# Patient Record
Sex: Female | Born: 1940 | Race: Black or African American | Hispanic: No | Smoking: Never smoker
Health system: Southern US, Community
[De-identification: ages and names within clinical notes are randomized; demographics above are authoritative.]

## PROBLEM LIST (undated history)

## (undated) DIAGNOSIS — E785 Hyperlipidemia, unspecified: Secondary | ICD-10-CM

## (undated) DIAGNOSIS — R011 Cardiac murmur, unspecified: Secondary | ICD-10-CM

## (undated) DIAGNOSIS — M810 Age-related osteoporosis without current pathological fracture: Secondary | ICD-10-CM

## (undated) DIAGNOSIS — I10 Essential (primary) hypertension: Secondary | ICD-10-CM

## (undated) DIAGNOSIS — J302 Other seasonal allergic rhinitis: Secondary | ICD-10-CM

## (undated) HISTORY — PX: TUBAL LIGATION: SHX77

## (undated) HISTORY — DX: Other seasonal allergic rhinitis: J30.2

## (undated) HISTORY — PX: WRIST SURGERY: SHX841

## (undated) HISTORY — PX: CATARACT EXTRACTION: SUR2

## (undated) HISTORY — PX: FRACTURE SURGERY: SHX138

## (undated) HISTORY — DX: Age-related osteoporosis without current pathological fracture: M81.0

## (undated) HISTORY — DX: Hyperlipidemia, unspecified: E78.5

## (undated) HISTORY — PX: ORIF ANKLE FRACTURE: SUR919

---

## 1998-03-22 ENCOUNTER — Other Ambulatory Visit: Admission: RE | Admit: 1998-03-22 | Discharge: 1998-03-22 | Payer: Self-pay | Admitting: Obstetrics and Gynecology

## 1998-03-22 ENCOUNTER — Other Ambulatory Visit: Admission: RE | Admit: 1998-03-22 | Discharge: 1998-03-22 | Payer: Self-pay | Admitting: Family Medicine

## 2001-02-07 ENCOUNTER — Encounter: Payer: Self-pay | Admitting: Family Medicine

## 2001-02-07 ENCOUNTER — Encounter: Admission: RE | Admit: 2001-02-07 | Discharge: 2001-02-07 | Payer: Self-pay | Admitting: Family Medicine

## 2001-05-09 ENCOUNTER — Emergency Department (HOSPITAL_COMMUNITY): Admission: EM | Admit: 2001-05-09 | Discharge: 2001-05-09 | Payer: Self-pay | Admitting: Emergency Medicine

## 2001-05-10 ENCOUNTER — Encounter: Payer: Self-pay | Admitting: Emergency Medicine

## 2002-02-18 ENCOUNTER — Encounter: Admission: RE | Admit: 2002-02-18 | Discharge: 2002-02-18 | Payer: Self-pay | Admitting: Family Medicine

## 2002-03-18 ENCOUNTER — Encounter: Admission: RE | Admit: 2002-03-18 | Discharge: 2002-03-18 | Payer: Self-pay | Admitting: Family Medicine

## 2002-03-26 ENCOUNTER — Encounter: Admission: RE | Admit: 2002-03-26 | Discharge: 2002-03-26 | Payer: Self-pay | Admitting: Family Medicine

## 2002-08-06 ENCOUNTER — Encounter: Admission: RE | Admit: 2002-08-06 | Discharge: 2002-08-06 | Payer: Self-pay | Admitting: Family Medicine

## 2002-09-01 ENCOUNTER — Encounter: Admission: RE | Admit: 2002-09-01 | Discharge: 2002-09-01 | Payer: Self-pay | Admitting: Family Medicine

## 2002-09-08 ENCOUNTER — Encounter: Admission: RE | Admit: 2002-09-08 | Discharge: 2002-09-08 | Payer: Self-pay | Admitting: Sports Medicine

## 2003-05-13 ENCOUNTER — Encounter: Admission: RE | Admit: 2003-05-13 | Discharge: 2003-05-13 | Payer: Self-pay | Admitting: Family Medicine

## 2003-09-29 ENCOUNTER — Encounter: Admission: RE | Admit: 2003-09-29 | Discharge: 2003-09-29 | Payer: Self-pay | Admitting: Family Medicine

## 2003-12-13 ENCOUNTER — Encounter: Admission: RE | Admit: 2003-12-13 | Discharge: 2003-12-13 | Payer: Self-pay | Admitting: Family Medicine

## 2004-04-28 ENCOUNTER — Encounter: Admission: RE | Admit: 2004-04-28 | Discharge: 2004-04-28 | Payer: Self-pay | Admitting: Sports Medicine

## 2004-12-07 ENCOUNTER — Ambulatory Visit (HOSPITAL_COMMUNITY): Admission: RE | Admit: 2004-12-07 | Discharge: 2004-12-07 | Payer: Self-pay | Admitting: Family Medicine

## 2004-12-07 ENCOUNTER — Ambulatory Visit: Payer: Self-pay | Admitting: Family Medicine

## 2005-01-16 ENCOUNTER — Ambulatory Visit: Payer: Self-pay | Admitting: Sports Medicine

## 2005-01-24 ENCOUNTER — Encounter (INDEPENDENT_AMBULATORY_CARE_PROVIDER_SITE_OTHER): Payer: Self-pay | Admitting: *Deleted

## 2005-01-25 ENCOUNTER — Ambulatory Visit: Payer: Self-pay | Admitting: Family Medicine

## 2005-02-15 ENCOUNTER — Ambulatory Visit: Payer: Self-pay | Admitting: Family Medicine

## 2005-05-02 ENCOUNTER — Emergency Department (HOSPITAL_COMMUNITY): Admission: EM | Admit: 2005-05-02 | Discharge: 2005-05-02 | Payer: Self-pay | Admitting: Emergency Medicine

## 2005-08-09 ENCOUNTER — Ambulatory Visit: Payer: Self-pay | Admitting: Family Medicine

## 2005-09-14 ENCOUNTER — Ambulatory Visit: Payer: Self-pay | Admitting: Family Medicine

## 2006-02-19 ENCOUNTER — Ambulatory Visit: Payer: Self-pay | Admitting: Sports Medicine

## 2006-08-08 ENCOUNTER — Ambulatory Visit: Payer: Self-pay | Admitting: Family Medicine

## 2006-08-13 ENCOUNTER — Encounter: Admission: RE | Admit: 2006-08-13 | Discharge: 2006-08-13 | Payer: Self-pay | Admitting: Sports Medicine

## 2006-10-11 ENCOUNTER — Ambulatory Visit: Payer: Self-pay | Admitting: Family Medicine

## 2007-01-23 DIAGNOSIS — I10 Essential (primary) hypertension: Secondary | ICD-10-CM | POA: Insufficient documentation

## 2007-01-23 DIAGNOSIS — I498 Other specified cardiac arrhythmias: Secondary | ICD-10-CM | POA: Insufficient documentation

## 2007-01-23 DIAGNOSIS — E7800 Pure hypercholesterolemia, unspecified: Secondary | ICD-10-CM | POA: Insufficient documentation

## 2007-01-23 DIAGNOSIS — N3941 Urge incontinence: Secondary | ICD-10-CM

## 2007-01-23 DIAGNOSIS — M810 Age-related osteoporosis without current pathological fracture: Secondary | ICD-10-CM | POA: Insufficient documentation

## 2007-01-24 ENCOUNTER — Encounter (INDEPENDENT_AMBULATORY_CARE_PROVIDER_SITE_OTHER): Payer: Self-pay | Admitting: *Deleted

## 2007-04-01 ENCOUNTER — Other Ambulatory Visit: Admission: RE | Admit: 2007-04-01 | Discharge: 2007-04-01 | Payer: Self-pay | Admitting: Family Medicine

## 2007-12-30 ENCOUNTER — Emergency Department (HOSPITAL_COMMUNITY): Admission: EM | Admit: 2007-12-30 | Discharge: 2007-12-30 | Payer: Self-pay | Admitting: Emergency Medicine

## 2008-01-12 ENCOUNTER — Encounter (INDEPENDENT_AMBULATORY_CARE_PROVIDER_SITE_OTHER): Payer: Self-pay | Admitting: Internal Medicine

## 2008-01-12 ENCOUNTER — Ambulatory Visit: Payer: Self-pay | Admitting: *Deleted

## 2008-01-12 ENCOUNTER — Inpatient Hospital Stay (HOSPITAL_COMMUNITY): Admission: EM | Admit: 2008-01-12 | Discharge: 2008-01-13 | Payer: Self-pay | Admitting: Emergency Medicine

## 2009-02-09 ENCOUNTER — Emergency Department (HOSPITAL_COMMUNITY): Admission: EM | Admit: 2009-02-09 | Discharge: 2009-02-09 | Payer: Self-pay | Admitting: Emergency Medicine

## 2009-09-09 ENCOUNTER — Other Ambulatory Visit: Admission: RE | Admit: 2009-09-09 | Discharge: 2009-09-09 | Payer: Self-pay | Admitting: Family Medicine

## 2011-02-12 ENCOUNTER — Inpatient Hospital Stay (INDEPENDENT_AMBULATORY_CARE_PROVIDER_SITE_OTHER)
Admission: RE | Admit: 2011-02-12 | Discharge: 2011-02-12 | Disposition: A | Discharge status: Home / Self Care | Payer: Medicare Other | Source: Ambulatory Visit | Attending: Family Medicine | Admitting: Family Medicine

## 2011-02-12 DIAGNOSIS — J40 Bronchitis, not specified as acute or chronic: Secondary | ICD-10-CM

## 2011-04-10 NOTE — H&P (Signed)
[name], [name]                ACCOUNT NO.:  [id number]   MEDICAL RECORD NO.:  [id number]          PATIENT TYPE:  OBV   LOCATION:  1424                         FAC[facility]   PHYSICIAN:  Michiel Cowboy, MDDATE OF BIRTH:  1941/05/19   DATE OF ADMISSION:  01/11/2008  DATE OF DISCHARGE:                              HISTORY & PHYSICAL   CHIEF COMPLAINT:  Syncope.   PRIMARY CARE PHYSICIAN:  Dr. Holley Bouche.   HISTORY OF PRESENT ILLNESS:  This is a 70 year old female with a past  medical history of hyperlipidemia and hypertension.  She presents with  an episode of syncope.  The patient was at her baseline state of health  up until this morning, when she woke up with a pounding headache which  is not the usual thing for her.  She went to the bathroom to urinate,  and then went back to bed.  She continued to have a headache.  She felt  that maybe she needs to make sure she took her blood pressure  medication.  She went to the kitchen and reached out for her blood  pressure medication and fell on the floor.  She recollects herself  waking up shortly thereafter.  She does not quite remember how she  collapsed.  No witnesses to the event.  She was back to her baseline as  soon as she woke up.  No transient confusion.  No tongue biting or  incontinence.  She continued to have severe headache.  EMS was called,  and the patient was brought to the emergency department.  The [facility] called to admit the patient.   REVIEW OF SYSTEMS:  Otherwise the review of systems is negative.  No  chest pain, no shortness of breath.  The patient is a fairly active  female and does not report any exertional chest pain or shortness of  breath.  No prior episodes of syncope.  The patient does not get  frequent headaches.   PAST MEDICAL HISTORY:  1. Osteoporosis.  2. Hypertension.  3. Hyperlipidemia.  4. Urinary retention.   ALLERGIES:  No known drug allergies.   CURRENT  MEDICATIONS:  1. Hydrochlorothiazide 25 mg daily.  2. Norvasc 5 mg daily.  3. Fexofenadine 180 mg daily.  4. Zocor 80 mg once daily.  5. Oxybutynin 0.5 mg twice daily.  6. Aspirin 81 mg once daily.   SOCIAL HISTORY:  The patient lives at home alone, but has a son and a  daughter who live near her in that town.  Does not smoke.  Does not  drink.  Does not use IV drugs.   FAMILY HISTORY:  Significant for a brother with colon cancer.  Another  brother with lung cancer.  Both parents are dead with history of  high  blood pressure.   PHYSICAL EXAMINATION:  VITAL SIGNS:  Blood pressure 161/78, heart rate  78, respirations 16, temperature 98.6 degrees, saturation 97% on room  air.  GENERAL:  Overall she appears to be in no acute distress.  HEENT:  Head atraumatic.  Pupils equal, round, reactive to  light and  accommodation.  NECK:  No lymphadenopathy noted.  No bruits noted.  LUNGS:  Clear to auscultation bilaterally.  HEART:  A regular rate and rhythm.  No murmurs noted.  ABDOMEN:  No bruits noted.  EXTREMITIES:  Lower extremities had edema.  Pulses equal bilaterally.  NEUROLOGIC:  Intact.   LABORATORY DATA:  White blood cell count slightly up at 11.2, hemoglobin  14.6.  Sodium 139, potassium 3.6, bicarbonate slightly elevated at 32,  creatinine 0.7.  D-dimer negative.  Urinalysis shows a few bacteria but  no white blood cells.   Chest x-ray was within normal limits.  A CT of the head without  contrast:  No acute hemorrhage.   Electrocardiogram:  Showing normal sinus rhythm, question of possible  left atrial enlargement.  There is very minimal, less than 1 mm, ST  depression in lead V4 and V5, which on repeat electrocardiogram does not  show.  Question of small Q-waves in leads I and aVF.  Large R-wave in  lead V1.   ASSESSMENT/PLAN:  This is a 70 year old female with a history of  hypertension and hyperlipidemia, with syncope.  1. Cause of syncope differential could include  cardiogenic, versus      vasovagal, versus orthostasis:  Will check orthostatics and admit      the patient to telemetry.  Given a history of hypertension and age      above 80, will rule out for a myocardial infarction with serial      cardiac enzymes and serial electrocardiograms.  Also will obtain an      echocardiogram of the heart to rule out for valvular abnormalities      or evidence of prior myocardial infarction.  Will obtain fasting      lipid panel.  The patient may benefit from an outpatient stress      test in the future, given some abnormalities on her      electrocardiogram.  2. Hypertension:  Will continue Norvasc but hold off on      hydrochlorothiazide.  3. Hyperlipidemia:  Continue Zocor.  4. Coronary artery disease with prophylaxis.  Will continue aspirin.      We will check a hemoglobin A1c, TSH and beta natriuretic peptide.  5. Possibly dehydrated:  Will hold hydrochlorothiazide and give normal      saline 120 mL an hour.  6. Headache:  A CT of the head within normal limits.  The etiology      could be migraine headache, versus given that it is happening upon      awakening, worry for the possibility of sleep apnea.  The patient      may benefit from a      sleep study in the future if this continues to persist.  Given that      the patient states that she sometimes feels a pulse in her head,      will obtain Dopplers of her neck.  7. Prophylaxis:  Lovenox and a good diet.      Michiel Cowboy, MD  Electronically Signed     AVD/MEDQ  D:  01/11/2008  T:  01/12/2008  Job:  [id number]   cc:   Holley Bouche, M.D.  Fax: [phone]

## 2011-04-10 NOTE — Discharge Summary (Signed)
[name], [name]                ACCOUNT NO.:  [id number]   MEDICAL RECORD NO.:  [id number]          PATIENT TYPE:  INP   LOCATION:  1424                         FACILITY:  [facility]   PHYSICIAN:  Michiel Cowboy, MDDATE OF BIRTH:  17-Feb-1941   DATE OF ADMISSION:  01/11/2008  DATE OF DISCHARGE:  01/13/2008                               DISCHARGE SUMMARY   DISCHARGE DIAGNOSES:  1. Syncope.  2. Palpations.  3. Sinus bradycardia.  4. Premature ventricular contractions.  5. Osteoporosis.  6. Hypertension.  7. Hyperlipidemia.  8. History of urinary retention.  9. Questionable patent foramen ovale by an echocardiogram in the past,      not seen on this echocardiogram during this admission.   STUDIES:  An echocardiogram done on January 12, 2008, showed an  ejection fraction of 70%-75%.  No left ventricular regional wall motion  abnormalities.  Left ventricular wall thickness slightly increased.  Aortic wall thickness mildly increased.  Aortic valve calcified.  There  was a moderate tricuspid valvular regurgitation.   Carotid Dopplers performed showing no significant right or left ICA  stenosis.  There is mild heterogeneous plaque noted at the bifurcations,  but otherwise normal.   A chest x-ray done on January 11, 2008, showed no evidence of  cardiopulmonary disease, evidence of old granulomatous disease.  A CT of  the head without contrast negative for bleed.   Electrocardiogram:  Showed no acute ischemia or infarction.   HISTORY OF PRESENT ILLNESS:  Please see the H&P.  This is a pleasant 70-  year-old female with a past medical history of hyperlipidemia and  hypertension, who presents with an episode of syncope.  She was at her  baseline state of health until the morning of admission, when she had a  pounding headache.  The patient went to the bathroom to urinate and then  back to bed.  She tried to get up to take her medications and collapsed.  She woke up shortly  thereafter on the floor.  Reports no change in  mental status.  The patient was admitted by the [facility] for  further evaluation.   HOSPITAL COURSE:  #1 - SYNCOPE:  The etiology is unclear, possibly  vasovagal, versus post-micturition.  The patient was noted to be not  orthostatic, although initially she was felt to be just slightly dry, so  her hydrochlorothiazide was held and she received a little bit of  fluids.  An echocardiogram was performed which was within normal limits.  Cardiac enzymes were obtained, which also showed no abnormalities.  [facility] came down to see the patient.  She will be discharged  with a Holter monitor and followup with Dr. Corky Crafts.   #2 - HYPERTENSION:  Will hold her hydrochlorothiazide but continue her  Norvasc.   #3 - HYPERLIPIDEMIA:  LDL was 111.  The patient is to continue her  statin.   #4 - HISTORY OF POUNDING SENSATION/PALPITATIONS IN HER HEAD:  The  patient for the past 10 years has been feeling that she can hear her  pulse.  A prior workup for  this which was negative.  Repeated her  Dopplers, which were pretty much unremarkable, likely a chronic  condition.  Will have her follow up as an outpatient.   DISCHARGE MEDICATIONS:  1. Norvasc 5 mg p.o. daily.  2. Fexofenadine 180 mg p.o. daily.  3. Zocor 80 mg daily.  4. Oxybutynin 5 mg p.o. twice daily.  5. Aspirin 81 mg daily.   FOLLOWUP:  1. The patient is to follow up with Dr. Corky Crafts on February 17, 2008, at 12 p.m.  2. The patient is to follow up with Dr. Tiburcio Pea within one week.   DISCHARGE INSTRUCTIONS:  The patient is to hold off on her  hydrochlorothiazide and to have a Holter monitor as an outpatient.      Michiel Cowboy, MD  Electronically Signed     AVD/MEDQ  D:  01/13/2008  T:  01/14/2008  Job:  [id number]   cc:   Dr. Louellen Molder, MD  Fax: [phone]

## 2011-07-14 ENCOUNTER — Inpatient Hospital Stay (INDEPENDENT_AMBULATORY_CARE_PROVIDER_SITE_OTHER)
Admission: RE | Admit: 2011-07-14 | Discharge: 2011-07-14 | Disposition: A | Discharge status: Home / Self Care | Payer: Medicare Other | Source: Ambulatory Visit | Attending: Emergency Medicine | Admitting: Emergency Medicine

## 2011-07-14 ENCOUNTER — Emergency Department (HOSPITAL_COMMUNITY)
Admission: EM | Admit: 2011-07-14 | Discharge: 2011-07-14 | Disposition: A | Discharge status: Home / Self Care | Payer: Medicare Other | Attending: Emergency Medicine | Admitting: Emergency Medicine

## 2011-07-14 ENCOUNTER — Emergency Department (HOSPITAL_COMMUNITY): Payer: Medicare Other

## 2011-07-14 DIAGNOSIS — I10 Essential (primary) hypertension: Secondary | ICD-10-CM | POA: Insufficient documentation

## 2011-07-14 DIAGNOSIS — R51 Headache: Secondary | ICD-10-CM | POA: Insufficient documentation

## 2011-07-14 DIAGNOSIS — M549 Dorsalgia, unspecified: Secondary | ICD-10-CM

## 2011-07-14 DIAGNOSIS — E7800 Pure hypercholesterolemia, unspecified: Secondary | ICD-10-CM | POA: Insufficient documentation

## 2011-07-14 LAB — DIFFERENTIAL
Basophils Absolute: 0 10*3/uL (ref 0.0–0.1)
Basophils Relative: 0 % (ref 0–1)
Eosinophils Absolute: 0.2 10*3/uL (ref 0.0–0.7)
Monocytes Absolute: 0.5 10*3/uL (ref 0.1–1.0)
Monocytes Relative: 7 % (ref 3–12)
Neutro Abs: 5.5 10*3/uL (ref 1.7–7.7)
Neutrophils Relative %: 69 % (ref 43–77)

## 2011-07-14 LAB — CBC
Hemoglobin: 13.9 g/dL (ref 12.0–15.0)
MCH: 26.9 pg (ref 26.0–34.0)
MCHC: 33.2 g/dL (ref 30.0–36.0)
Platelets: 203 10*3/uL (ref 150–400)

## 2011-07-14 LAB — BASIC METABOLIC PANEL
Calcium: 10.2 mg/dL (ref 8.4–10.5)
GFR calc Af Amer: >60 mL/min (ref >60)
GFR calc non Af Amer: >60 mL/min (ref >60)
Glucose, Bld: 98 mg/dL (ref 70–99)
Potassium: 4 mEq/L (ref 3.5–5.1)
Sodium: 143 mEq/L (ref 135–145)

## 2011-08-17 LAB — LIPID PANEL
LDL Cholesterol: 111 — ABNORMAL HIGH
Total CHOL/HDL Ratio: 4.2
Triglycerides: 101
VLDL: 20

## 2011-08-17 LAB — URINALYSIS, ROUTINE W REFLEX MICROSCOPIC
Bilirubin Urine: NEGATIVE
Glucose, UA: NEGATIVE
Hgb urine dipstick: NEGATIVE
Ketones, ur: NEGATIVE
Nitrite: NEGATIVE
Protein, ur: NEGATIVE
Specific Gravity, Urine: 1.005
Urobilinogen, UA: 0.2
pH: 7.5

## 2011-08-17 LAB — POCT URINALYSIS DIP (DEVICE)
Bilirubin Urine: NEGATIVE
Glucose, UA: NEGATIVE
Nitrite: NEGATIVE
Operator id: 235561
Urobilinogen, UA: 0.2

## 2011-08-17 LAB — DIFFERENTIAL
Basophils Absolute: 0
Basophils Relative: 0
Eosinophils Absolute: 0.1
Eosinophils Relative: 1
Lymphocytes Relative: 9 — ABNORMAL LOW
Lymphs Abs: 1
Monocytes Absolute: 0.5
Monocytes Relative: 4
Neutro Abs: 9.6 — ABNORMAL HIGH
Neutrophils Relative %: 86 — ABNORMAL HIGH

## 2011-08-17 LAB — BASIC METABOLIC PANEL
BUN: 10
BUN: 9
CO2: 26
CO2: 27
CO2: 27
Calcium: 8.8
Calcium: 9.1
Calcium: 9.8
Chloride: 103
Chloride: 109
Creatinine, Ser: 0.6
GFR calc Af Amer: >60
GFR calc Af Amer: >60
GFR calc non Af Amer: >60
GFR calc non Af Amer: >60
Glucose, Bld: 105 — ABNORMAL HIGH
Glucose, Bld: 92
Glucose, Bld: 95
Glucose, Bld: 98
Potassium: 3.3 — ABNORMAL LOW
Potassium: 3.6
Potassium: 4
Sodium: 139
Sodium: 142

## 2011-08-17 LAB — CBC
HCT: 36.7
HCT: 43.5
Hemoglobin: 12.2
Hemoglobin: 14.6
MCHC: 33.6
MCHC: 33.6
MCV: 80.7
MCV: 80.9
Platelets: 219
Platelets: 249
RBC: 4.52
RBC: 5.39 — ABNORMAL HIGH
RDW: 14.8
RDW: 15.1
WBC: 11.2 — ABNORMAL HIGH

## 2011-08-17 LAB — BASIC METABOLIC PANEL WITH GFR
BUN: 10
CO2: 32
Calcium: 9.3
Chloride: 99
Creatinine, Ser: 0.66
Creatinine, Ser: 0.7
GFR calc Af Amer: >60
GFR calc non Af Amer: >60
Sodium: 139

## 2011-08-17 LAB — HEPATIC FUNCTION PANEL
Alkaline Phosphatase: 66
Bilirubin, Direct: 0.1
Indirect Bilirubin: 0.4
Total Bilirubin: 0.5

## 2011-08-17 LAB — CARDIAC PANEL(CRET KIN+CKTOT+MB+TROPI)
CK, MB: 1.8
Relative Index: INVALID
Troponin I: 0.03

## 2011-08-17 LAB — POCT CARDIAC MARKERS
CKMB, poc: 3
Myoglobin, poc: 147
Operator id: 1192
Troponin i, poc: <0.05

## 2011-08-17 LAB — HEMOGLOBIN A1C: Hgb A1c MFr Bld: 6.1

## 2011-08-17 LAB — D-DIMER, QUANTITATIVE: D-Dimer, Quant: 0.35

## 2011-08-17 LAB — CK TOTAL AND CKMB (NOT AT ARMC)
Relative Index: INVALID
Total CK: 95

## 2011-08-17 LAB — URINE MICROSCOPIC-ADD ON

## 2011-08-17 LAB — B-NATRIURETIC PEPTIDE (CONVERTED LAB): Pro B Natriuretic peptide (BNP): 30

## 2011-12-20 ENCOUNTER — Emergency Department (HOSPITAL_COMMUNITY)
Admission: EM | Admit: 2011-12-20 | Discharge: 2011-12-20 | Disposition: A | Discharge status: Home / Self Care | Payer: Medicare Other | Attending: Emergency Medicine | Admitting: Emergency Medicine

## 2011-12-20 ENCOUNTER — Encounter (HOSPITAL_COMMUNITY): Payer: Self-pay | Admitting: Emergency Medicine

## 2011-12-20 DIAGNOSIS — Z8614 Personal history of Methicillin resistant Staphylococcus aureus infection: Secondary | ICD-10-CM | POA: Insufficient documentation

## 2011-12-20 DIAGNOSIS — R112 Nausea with vomiting, unspecified: Secondary | ICD-10-CM | POA: Insufficient documentation

## 2011-12-20 DIAGNOSIS — I10 Essential (primary) hypertension: Secondary | ICD-10-CM | POA: Insufficient documentation

## 2011-12-20 HISTORY — DX: Essential (primary) hypertension: I10

## 2011-12-20 HISTORY — DX: Cardiac murmur, unspecified: R01.1

## 2011-12-20 LAB — URINALYSIS, ROUTINE W REFLEX MICROSCOPIC
Glucose, UA: NEGATIVE mg/dL
Leukocytes, UA: NEGATIVE
Nitrite: NEGATIVE
Protein, ur: NEGATIVE mg/dL
Urobilinogen, UA: 0.2 mg/dL (ref 0.0–1.0)

## 2011-12-20 MED ORDER — ONDANSETRON 8 MG PO TBDP: 8.0000 mg | ORAL_TABLET | Freq: Three times a day (TID) | ORAL | Status: AC | PRN

## 2011-12-20 MED ORDER — SODIUM CHLORIDE 0.9 % IV SOLN
INTRAVENOUS | Status: DC
  Administered 2011-12-20: 03:00:00 via INTRAVENOUS

## 2011-12-20 MED ORDER — LORAZEPAM 1 MG PO TABS: 1.0000 mg | ORAL_TABLET | Freq: Every evening | ORAL | Status: AC | PRN

## 2011-12-20 MED ORDER — ONDANSETRON HCL 4 MG/2ML IJ SOLN
4.0000 mg | Freq: Once | INTRAMUSCULAR | Status: AC
  Administered 2011-12-20: 4 mg via INTRAVENOUS
  Filled 2011-12-20: qty 2

## 2011-12-20 MED ORDER — PROMETHAZINE HCL 25 MG/ML IJ SOLN
12.5000 mg | Freq: Once | INTRAMUSCULAR | Status: AC
  Administered 2011-12-20: 12.5 mg via INTRAVENOUS
  Filled 2011-12-20: qty 1

## 2011-12-20 NOTE — ED Provider Notes (Signed)
History     CSN: [id number]  Arrival date & time 12/20/11  0125   First MD Initiated Contact with Patient 12/20/11 0147      Chief Complaint  Patient presents with  . Nausea and vomiting     (Consider location/radiation/quality/duration/timing/severity/associated sxs/prior treatment) HPI A 71 year old black female with history of nausea and vomiting since yesterday about noon. She describes the vomiting as severe at first but has improved subsequently. She is still nauseated. She denies diarrhea. She denies fever, chills, chest pain, dyspnea, abdominal pain, dysuria or diaphoresis. She has not taken anything for the nausea. She was recently placed on doxycycline and then Bactrim for MRSA abscess of the left temple. She's been on the Bactrim for 6 days. The abscess is nearly resolved.  Past Medical History  Diagnosis Date  . Hypertension     Past Surgical History  Procedure Date  . Fracture surgery     No family history on file.  History  Substance Use Topics  . Smoking status: Not on file  . Smokeless tobacco: Not on file  . Alcohol Use:     OB History    Grav Para Term Preterm Abortions TAB SAB Ect Mult Living                  Review of Systems  All other systems reviewed and are negative.    Allergies  Review of patient's allergies indicates no known allergies.  Home Medications   Current Outpatient Rx  Name Route Sig Dispense Refill  . ALENDRONATE SODIUM 70 MG PO TABS Oral Take 70 mg by mouth every 7 (seven) days. Take with a full glass of water on an empty stomach.    . AMLODIPINE BESYLATE 5 MG PO TABS Oral Take 5 mg by mouth daily.    . ASPIRIN 81 MG PO TABS Oral Take 160 mg by mouth daily.    . ATORVASTATIN CALCIUM 20 MG PO TABS Oral Take 20 mg by mouth daily.    Marland Kitchen FLUTICASONE PROPIONATE 50 MCG/ACT NA SUSP Nasal Place 2 sprays into the nose daily.    . OXYBUTYNIN CHLORIDE ER 10 MG PO TB24 Oral Take 10 mg by mouth daily.    . SULFAMETHOXAZOLE-TMP DS  800-160 MG PO TABS Oral Take 1 tablet by mouth 2 (two) times daily.      BP 148/72  Pulse 60  Temp 98 F (36.7 C)  Resp 16  Wt 140 lb (63.504 kg)  SpO2 99%  Physical Exam General: Well-developed, well-nourished female in no acute distress; appearance consistent with age of record HENT: normocephalic, atraumatic Eyes: pupils equal round and reactive to light; extraocular muscles intact; arcus senilis bilateral Neck: supple Heart: regular rate and rhythm; frequent ectopy; 2/6 systolic murmur right upper sternal border Lungs: clear to auscultation bilaterally Abdomen: soft; nondistended; nontender; no masses or hepatosplenomegaly; bowel sounds present Extremities: No deformity; full range of motion Neurologic: Awake, alert and oriented; motor function intact in all extremities and symmetric; no facial droop Skin: Warm and dry; well healing abscess left temple Psychiatric: Flat    ED Course  Procedures (including critical care time)    MDM   Nursing notes and vitals signs, including pulse oximetry, reviewed.  Summary of this visit's results, reviewed by myself:  Labs:  Results for orders placed during the hospital encounter of 12/20/11  URINALYSIS, ROUTINE W REFLEX MICROSCOPIC      Component Value Range   Color, Urine YELLOW  YELLOW    APPearance  CLEAR  CLEAR    Specific Gravity, Urine 1.017  1.005 - 1.030    pH 6.5  5.0 - 8.0    Glucose, UA NEGATIVE  NEGATIVE (mg/dL)   Hgb urine dipstick NEGATIVE  NEGATIVE    Bilirubin Urine NEGATIVE  NEGATIVE    Ketones, ur TRACE (*) NEGATIVE (mg/dL)   Protein, ur NEGATIVE  NEGATIVE (mg/dL)   Urobilinogen, UA 0.2  0.0 - 1.0 (mg/dL)   Nitrite NEGATIVE  NEGATIVE    Leukocytes, UA NEGATIVE  NEGATIVE    3:58 AM Nausea relieved. No emesis in ED.          Hanley Seamen, MD 12/20/11 [phone]

## 2011-12-20 NOTE — ED Notes (Signed)
Pt alert, nad, c/o emesis, onset this evening, pt resp even unlabored, recently treated for infection to left temple area, placed on Septra

## 2011-12-20 NOTE — ED Notes (Signed)
Patient stable upon discharge.  

## 2011-12-20 NOTE — ED Notes (Signed)
States no abd pain but has had n/v since Monday and feels lightheaded and shakey.   No problems urinating and no diarrhea.

## 2015-02-23 ENCOUNTER — Emergency Department (HOSPITAL_COMMUNITY)
Admission: EM | Admit: 2015-02-23 | Discharge: 2015-02-23 | Disposition: A | Discharge status: Home / Self Care | Payer: Medicare Other | Attending: Emergency Medicine | Admitting: Emergency Medicine

## 2015-02-23 ENCOUNTER — Encounter (HOSPITAL_COMMUNITY): Payer: Self-pay

## 2015-02-23 ENCOUNTER — Encounter (HOSPITAL_COMMUNITY): Payer: Self-pay | Admitting: *Deleted

## 2015-02-23 ENCOUNTER — Emergency Department (HOSPITAL_COMMUNITY)
Admission: EM | Admit: 2015-02-23 | Discharge: 2015-02-23 | Disposition: A | Discharge status: Home / Self Care | Payer: Medicare Other | Source: Home / Self Care | Attending: Family Medicine | Admitting: Family Medicine

## 2015-02-23 ENCOUNTER — Emergency Department (HOSPITAL_COMMUNITY): Payer: Medicare Other

## 2015-02-23 DIAGNOSIS — Z7982 Long term (current) use of aspirin: Secondary | ICD-10-CM | POA: Diagnosis not present

## 2015-02-23 DIAGNOSIS — R001 Bradycardia, unspecified: Secondary | ICD-10-CM

## 2015-02-23 DIAGNOSIS — J301 Allergic rhinitis due to pollen: Secondary | ICD-10-CM

## 2015-02-23 DIAGNOSIS — R072 Precordial pain: Secondary | ICD-10-CM

## 2015-02-23 DIAGNOSIS — I10 Essential (primary) hypertension: Secondary | ICD-10-CM | POA: Insufficient documentation

## 2015-02-23 DIAGNOSIS — R079 Chest pain, unspecified: Secondary | ICD-10-CM | POA: Diagnosis not present

## 2015-02-23 DIAGNOSIS — Z79899 Other long term (current) drug therapy: Secondary | ICD-10-CM | POA: Diagnosis not present

## 2015-02-23 DIAGNOSIS — R011 Cardiac murmur, unspecified: Secondary | ICD-10-CM | POA: Insufficient documentation

## 2015-02-23 LAB — BASIC METABOLIC PANEL
Anion gap: 8 (ref 5–15)
BUN: 9 mg/dL (ref 6–23)
CALCIUM: 9.9 mg/dL (ref 8.4–10.5)
CO2: 26 mmol/L (ref 19–32)
Chloride: 108 mmol/L (ref 96–112)
Creatinine, Ser: 0.75 mg/dL (ref 0.50–1.10)
GFR calc Af Amer: >90 mL/min (ref >90)
GFR calc non Af Amer: 82 mL/min — ABNORMAL LOW (ref >90)
Glucose, Bld: 94 mg/dL (ref 70–99)
POTASSIUM: 3.9 mmol/L (ref 3.5–5.1)
SODIUM: 142 mmol/L (ref 135–145)

## 2015-02-23 LAB — CBC
HEMATOCRIT: 43.1 % (ref 36.0–46.0)
Hemoglobin: 14.3 g/dL (ref 12.0–15.0)
MCH: 28.3 pg (ref 26.0–34.0)
MCHC: 33.2 g/dL (ref 30.0–36.0)
MCV: 85.2 fL (ref 78.0–100.0)
Platelets: 182 10*3/uL (ref 150–400)
RBC: 5.06 MIL/uL (ref 3.87–5.11)
RDW: 14.6 % (ref 11.5–15.5)
WBC: 7 10*3/uL (ref 4.0–10.5)

## 2015-02-23 LAB — I-STAT TROPONIN, ED
TROPONIN I, POC: 0 ng/mL (ref 0.00–0.08)
TROPONIN I, POC: 0 ng/mL (ref 0.00–0.08)

## 2015-02-23 LAB — TROPONIN I

## 2015-02-23 LAB — BRAIN NATRIURETIC PEPTIDE: B NATRIURETIC PEPTIDE 5: 125 pg/mL — AB (ref 0.0–100.0)

## 2015-02-23 MED ORDER — SODIUM CHLORIDE 0.9 % IV SOLN
Freq: Once | INTRAVENOUS | Status: AC
  Administered 2015-02-23: 13:00:00 via INTRAVENOUS

## 2015-02-23 MED ORDER — ASPIRIN 81 MG PO CHEW
324.0000 mg | CHEWABLE_TABLET | Freq: Once | ORAL | Status: AC
  Administered 2015-02-23: 324 mg via ORAL

## 2015-02-23 MED ORDER — ASPIRIN 81 MG PO CHEW
CHEWABLE_TABLET | ORAL | Status: AC
  Filled 2015-02-23: qty 4

## 2015-02-23 NOTE — ED Notes (Addendum)
C/o 3-4 day duration of chest pain and tightness . Pain mid chest w some pain in shoulders & neck. Denies nausea or vomiting. Pain not reproducible.  Also c/o some lightheadedness

## 2015-02-23 NOTE — ED Provider Notes (Signed)
Patient care acquired from [name], PA-C pending delta troponin results with plan for discharge home with outpatient cardiology follow up for stress test. Cardiology office is planning to call patient tomorrow with schedule appointment time.   Results for orders placed or performed during the hospital encounter of 02/23/15  CBC  Result Value Ref Range   WBC 7.0 4.0 - 10.5 K/uL   RBC 5.06 3.87 - 5.11 MIL/uL   Hemoglobin 14.3 12.0 - 15.0 g/dL   HCT 40.943.1 81.136.0 - 91.446.0 %   MCV 85.2 78.0 - 100.0 fL   MCH 28.3 26.0 - 34.0 pg   MCHC 33.2 30.0 - 36.0 g/dL   RDW 78.214.6 95.611.5 - 21.315.5 %   Platelets 182 150 - 400 K/uL  Basic metabolic panel  Result Value Ref Range   Sodium 142 135 - 145 mmol/L   Potassium 3.9 3.5 - 5.1 mmol/L   Chloride 108 96 - 112 mmol/L   CO2 26 19 - 32 mmol/L   Glucose, Bld 94 70 - 99 mg/dL   BUN 9 6 - 23 mg/dL   Creatinine, Ser 0.860.75 0.50 - 1.10 mg/dL   Calcium 9.9 8.4 - 57.810.5 mg/dL   GFR calc non Af Amer 82 (L) >90 mL/min   GFR calc Af Amer >90 >90 mL/min   Anion gap 8 5 - 15  BNP (order ONLY if patient complains of dyspnea/SOB AND you have documented it for THIS visit)  Result Value Ref Range   B Natriuretic Peptide 125.0 (H) 0.0 - 100.0 pg/mL  Troponin I ([facility])  Result Value Ref Range   Troponin I <0.03 <0.031 ng/mL  [facility] troponin[facility])  Result Value Ref Range   Troponin i, poc 0.00 0.00 - 0.08 ng/mL   Comment 3          I-stat troponin, ED  Result Value Ref Range   Troponin i, poc 0.00 0.00 - 0.08 ng/mL   Comment 3           Dg Chest 2 View  02/23/2015   CLINICAL DATA:  Nonproductive cough, chest pain since this morning; history of hypertension, nonsmoker.  EXAM: CHEST  2 VIEW  COMPARISON:  PA and lateral chest of January 11, 2008  FINDINGS: The lungs are mildly hyperinflated and clear. The heart and pulmonary vascularity are normal. There are stable rounded calcified densities consistent with mediastinal lymph nodes. There is no pleural effusion.  The bony thorax is unremarkable.  IMPRESSION: COPD. There is no evidence of pneumonia nor other active cardiopulmonary disease. There is evidence of previous granulomatous infection.   Electronically Signed   By: David  Swazil[place]   On: 02/23/2015 14:56    On re-evaluation patient is still chest pain-free, able to ambulate without assistance to the restroom without chest pain. Discussed delta troponin test results with patient who is still requesting discharge home. Discussed that cardiology will schedule an outpatient stress test for her and she should expect a call tomorrow. Return precautions discussed. Patient and family are agreeable to return for any returning chest pain, changes in symptoms or other concerning findings. Patient is stable at time of discharge   1. Chest pain, unspecified chest pain type      Lindsay PiccoloJennifer Chelcey Caputo, PA-C 02/23/15 2120  Blane OharaJoshua Zavitz, MD 02/25/15 2351

## 2015-02-23 NOTE — ED Notes (Signed)
Pt reports intermittent central chest pain x3 days.  Pt reports that she will feel a tightness in the center of her chest and become dizzy and nauseous.  Pt denies any SHOB or radiation.

## 2015-02-23 NOTE — Discharge Instructions (Signed)
Please read and follow all provided instructions.  Your diagnoses today include:  1. Chest pain, unspecified chest pain type    Tests performed today include:  An EKG of your heart  A chest x-ray  Cardiac enzymes - a blood test for heart muscle damage  Blood counts and electrolytes  Vital signs. See below for your results today.   Medications prescribed:   None  Take any prescribed medications only as directed.  Follow-up instructions: Please follow-up with the cardiologist for stress testing as we discussed.   Return instructions:  SEEK IMMEDIATE MEDICAL ATTENTION IF:  You have severe chest pain, especially if the pain is crushing or pressure-like and spreads to the arms, back, neck, or jaw, or if you have sweating, nausea (feeling sick to your stomach), or shortness of breath. THIS IS AN EMERGENCY. Don't wait to see if the pain will go away. Get medical help at once. Call 911 or 0 (operator). DO NOT drive yourself to the hospital.   Your chest pain gets worse and does not go away with rest.   You have an attack of chest pain lasting longer than usual, despite rest and treatment with the medications your caregiver has prescribed.   You wake from sleep with chest pain or shortness of breath.  You feel dizzy or faint.  You have chest pain not typical of your usual pain for which you originally saw your caregiver.   You have any other emergent concerns regarding your health.  Additional Information: Chest pain comes from many different causes. Your caregiver has diagnosed you as having chest pain that is not specific for one problem, but does not require admission.     Your vital signs today were: BP 150/63 mmHg   Pulse 57   Temp(Src) 97.4 F (36.3 C) (Oral)   Resp 18   SpO2 99% If your blood pressure (BP) was elevated above 135/85 this visit, please have this repeated by your doctor within one month. --------------

## 2015-02-23 NOTE — ED Provider Notes (Signed)
CSN: [id number]     Arrival date & time 02/23/15  1142 History   First MD Initiated Contact with Patient 02/23/15 1204     Chief Complaint  Patient presents with  . Chest Pain  . Dizziness   (Consider location/radiation/quality/duration/timing/severity/associated sxs/prior Treatment) HPI Comments: 74 year old female with history of hypertension, dyslipidemia, chronic heart murmur and bradycardia presents with a 2 day history of intermittent precordial chest tightness. She states it comes and goes. She denies associated nausea, vomiting or diaphoresis. Denies shortness of breath. Her second complaint is that of upper respiratory congestion, PND and allergy type symptoms.  Patient is a 74 y.o. female presenting with chest pain and dizziness.  Chest Pain Associated symptoms: dizziness   Associated symptoms: no cough, no fatigue, no fever, no palpitations and no shortness of breath   Dizziness Associated symptoms: chest pain   Associated symptoms: no palpitations and no shortness of breath     Past Medical History  Diagnosis Date  . Hypertension   . Heart murmur    Past Surgical History  Procedure Laterality Date  . Fracture surgery     History reviewed. No pertinent family history. History  Substance Use Topics  . Smoking status: Never Smoker   . Smokeless tobacco: Not on file  . Alcohol Use: Not on file   OB History    No data available     Review of Systems  Constitutional: Positive for activity change. Negative for fever and fatigue.  HENT: Positive for congestion, postnasal drip and rhinorrhea. Negative for sore throat.   Respiratory: Negative for cough, shortness of breath and wheezing.   Cardiovascular: Positive for chest pain. Negative for palpitations.  Gastrointestinal: Negative.   Genitourinary: Negative.   Skin: Negative.   Neurological: Positive for dizziness.    Allergies  Review of patient's allergies indicates no known allergies.  Home Medications    Prior to Admission medications   Medication Sig Start Date End Date Taking? Authorizing Provider  alendronate (FOSAMAX) 70 MG tablet Take 70 mg by mouth every 7 (seven) days. Take with a full glass of water on an empty stomach.    Historical Provider, MD  amLODipine (NORVASC) 5 MG tablet Take 5 mg by mouth daily.    Historical Provider, MD  aspirin 81 MG tablet Take 160 mg by mouth daily.    Historical Provider, MD  atorvastatin (LIPITOR) 20 MG tablet Take 20 mg by mouth daily.    Historical Provider, MD  fluticasone (FLONASE) 50 MCG/ACT nasal spray Place 2 sprays into the nose daily.    Historical Provider, MD  irbesartan (AVAPRO) 150 MG tablet Take 150 mg by mouth at bedtime.    Historical Provider, MD  oxybutynin (DITROPAN-XL) 10 MG 24 hr tablet Take 10 mg by mouth 2 (two) times daily.     Historical Provider, MD  sulfamethoxazole-trimethoprim (BACTRIM DS) 800-160 MG per tablet Take 1 tablet by mouth 2 (two) times daily.    Historical Provider, MD   BP 144/52 mmHg  Pulse 53  Temp(Src) 98 F (36.7 C) (Oral)  Resp 14  SpO2 96% Physical Exam  Constitutional: She is oriented to person, place, and time. She appears well-developed and well-nourished. No distress.  HENT:  Mouth/Throat: Oropharynx is clear and moist. No oropharyngeal exudate.  Eyes: Conjunctivae and EOM are normal. Pupils are equal, round, and reactive to light.  Neck: Normal range of motion. Neck supple.  Cardiovascular: Regular rhythm.   Murmur heard. EKG ventricular rate 48, initial vital sign heart  rate was 58.  Pulmonary/Chest: Effort normal and breath sounds normal. No respiratory distress. She has no wheezes. She has no rales.  Abdominal: Soft. There is no tenderness.  Musculoskeletal: She exhibits no edema or tenderness.  Lymphadenopathy:    She has no cervical adenopathy.  Neurological: She is alert and oriented to person, place, and time. She exhibits normal muscle tone.  Skin: Skin is warm and dry.   Psychiatric: She has a normal mood and affect.  Nursing note and vitals reviewed.   ED Course  Procedures (including critical care time) Labs Review Labs Reviewed - No data to display  Imaging Review No results found. ED ECG REPORT   Date: 02/23/2015  Rate: 48  Rhythm: sinus bradycardia  QRS Axis:   Intervals: PR 128  ST/T Wave abnormalities: No St-T wave abnormalities  Conduction Disutrbances:no ectopy  Narrative Interpretation:   Old EKG Reviewed:   I have personally reviewed the EKG tracing and agree with the computerized printout as noted.   MDM   1. Precordial chest pain   2. Essential hypertension   3. Allergic rhinitis due to pollen   4. Bradycardia    Transfer to [place] for evaluation of chest tightness.    Hayden Rasmussen, NP 02/23/15 1242  Hayden Rasmussen, NP 02/23/15 1250

## 2015-02-23 NOTE — ED Notes (Signed)
Pt in from [facility] via [facility] c/o mid CP onset x 3 days, denies SOB, N/v/d, non radiating, pt c/o dizziness onset 2-3 days, pt A&O x4, follows commands, speaks in complete sentences, pt has #20 L forearm, pt rcvd 324 mg ASA PTA

## 2015-02-23 NOTE — ED Provider Notes (Signed)
CSN: [id number]     Arrival date & time 02/23/15  1322 History   First MD Initiated Contact with Patient 02/23/15 1350     Chief Complaint  Patient presents with  . Chest Pain     (Consider location/radiation/quality/duration/timing/severity/associated sxs/prior Treatment) HPI Comments: Patient with history of hypertension and hypercholesterolemia controlled with medications presents with intermittent sternal chest pressure over the past 2 days. Patient has had multiple episodes lasting for several minutes. Patient describes it as a pressure. It is associated with dizziness and nausea. No radiation of pressure. No shortness of breath, diaphoresis, palpitations. Patient was seen at urgent care prior to arrival. She was given aspirin. Patient has not had a previous stress test. Patient exercises frequently. The symptoms have not been related to exercise but occur at rest. No diabetes, smoking, strong family history of ACS (? MI in father, patient unsure). Otherwise denies medical complaints. Patient denies risk factors for pulmonary embolism including: unilateral leg swelling, history of DVT/PE/other blood clots, use of estrogens, recent immobilizations, recent surgery, recent travel (>4hr segment), malignancy, hemoptysis.     Patient is a 74 y.o. female presenting with chest pain. The history is provided by the patient and medical records.  Chest Pain Associated symptoms: nausea   Associated symptoms: no abdominal pain, no back pain, no cough, no diaphoresis, no fever, no palpitations, no shortness of breath and not vomiting     Past Medical History  Diagnosis Date  . Hypertension   . Heart murmur    Past Surgical History  Procedure Laterality Date  . Fracture surgery     No family history on file. History  Substance Use Topics  . Smoking status: Never Smoker   . Smokeless tobacco: Not on file  . Alcohol Use: No   OB History    No data available     Review of Systems   Constitutional: Negative for fever and diaphoresis.  Eyes: Negative for redness.  Respiratory: Negative for cough and shortness of breath.   Cardiovascular: Positive for chest pain. Negative for palpitations and leg swelling.  Gastrointestinal: Positive for nausea. Negative for vomiting and abdominal pain.  Genitourinary: Negative for dysuria.  Musculoskeletal: Negative for back pain and neck pain.  Skin: Negative for rash.  Neurological: Positive for light-headedness. Negative for syncope.  Psychiatric/Behavioral: The patient is not nervous/anxious.     Allergies  Aspirin  Home Medications   Prior to Admission medications   Medication Sig Start Date End Date Taking? Authorizing Provider  acetaminophen (TYLENOL) 500 MG tablet Take 500 mg by mouth every 6 (six) hours as needed for mild pain.   Yes Historical Provider, MD  amLODipine (NORVASC) 5 MG tablet Take 5 mg by mouth daily.   Yes Historical Provider, MD  aspirin 81 MG tablet Take 160 mg by mouth daily.   Yes Historical Provider, MD  atorvastatin (LIPITOR) 20 MG tablet Take 20 mg by mouth daily.   Yes Historical Provider, MD  calcium-vitamin D (OSCAL) 250-125 MG-UNIT per tablet Take 1 tablet by mouth daily.   Yes Historical Provider, MD  irbesartan (AVAPRO) 150 MG tablet Take 150 mg by mouth at bedtime.   Yes Historical Provider, MD  Multiple Vitamin (MULTIVITAMIN) tablet Take 1 tablet by mouth daily.   Yes Historical Provider, MD  oxybutynin (DITROPAN-XL) 10 MG 24 hr tablet Take 10 mg by mouth 2 (two) times daily as needed.    Yes Historical Provider, MD   BP 150/63 mmHg  Pulse 57  Temp(Src) 97.4  F (36.3 C) (Oral)  Resp 18  SpO2 99%   Physical Exam  Constitutional: She appears well-developed and well-nourished.  HENT:  Head: Normocephalic and atraumatic.  Mouth/Throat: Mucous membranes are normal. Mucous membranes are not dry.  Eyes: Conjunctivae are normal. Right eye exhibits no discharge. Left eye exhibits no  discharge.  Neck: Trachea normal and normal range of motion. Neck supple. Normal carotid pulses and no JVD present. No muscular tenderness present. Carotid bruit is not present. No tracheal deviation present.  Cardiovascular: Normal rate, regular rhythm, S1 normal, S2 normal and intact distal pulses.  Exam reveals no decreased pulses.   Murmur (2/6 systolic, LSB) heard. Pulmonary/Chest: Effort normal and breath sounds normal. No respiratory distress. She has no wheezes. She exhibits no tenderness.  Abdominal: Soft. Normal aorta and bowel sounds are normal. There is no tenderness. There is no rebound and no guarding.  Musculoskeletal: Normal range of motion. She exhibits no edema or tenderness.  Neurological: She is alert.  Skin: Skin is warm and dry. She is not diaphoretic. No cyanosis. No pallor.  Psychiatric: She has a normal mood and affect.  Nursing note and vitals reviewed.   ED Course  Procedures (including critical care time) Labs Review Labs Reviewed  BASIC METABOLIC PANEL - Abnormal; Notable for the following:    GFR calc non Af Amer 82 (*)    All other components within normal limits  BRAIN NATRIURETIC PEPTIDE - Abnormal; Notable for the following:    B Natriuretic Peptide 125.0 (*)    All other components within normal limits  CBC  TROPONIN I  I-STAT TROPOININ, ED    Imaging Review Dg Chest 2 View  02/23/2015   CLINICAL DATA:  Nonproductive cough, chest pain since this morning; history of hypertension, nonsmoker.  EXAM: CHEST  2 VIEW  COMPARISON:  PA and lateral chest of January 11, 2008  FINDINGS: The lungs are mildly hyperinflated and clear. The heart and pulmonary vascularity are normal. There are stable rounded calcified densities consistent with mediastinal lymph nodes. There is no pleural effusion. The bony thorax is unremarkable.  IMPRESSION: COPD. There is no evidence of pneumonia nor other active cardiopulmonary disease. There is evidence of previous granulomatous  infection.   Electronically Signed   By: David  SwazilandJordan   On: 02/23/2015 14:56     EKG Interpretation   Date/Time:  Wednesday February 23 2015 13:26:47 EDT Ventricular Rate:  49 PR Interval:  179 QRS Duration: 79 QT Interval:  432 QTC Calculation: 390 R Axis:   40 Text Interpretation:  Sinus bradycardia Probable left atrial enlargement  Confirmed by ZAVITZ  MD, Fredericka Bottcher ([id number]) on 02/23/2015 4:11:16 PM       2:07 PM Patient seen and examined. Work-up initiated. EKG reviewed.    Vital signs reviewed and are as follows: BP 150/63 mmHg  Pulse 57  Temp(Src) 97.4 F (36.3 C) (Oral)  Resp 18  SpO2 99%  4:07 PM Patient discussed with and seen by Dr. Jodi MourningZavitz. Feel patient needs stress testing, but feel this can be done as outpatient. Patient lives with her daughter. We discussed risks and benefits. We discussed that she must return with recurrent or worsening symptoms. Patient's daughter states that she will ensure she returns if worsening. Patient seems reliable to return if worsening.   Spoke with Rosann Auerbachrish of cardiology who will arrange for outpatient stress, they will call tomorrow with time.   Pending 2nd marker. Handoff to Land O'LakesPiepenbrink PA-C who will f/u on results.  MDM   Final diagnoses:  Chest pain, unspecified chest pain type   Patient with intermittent chest pressure. Patient exercises regularly and this has not been related to activity. Heart score equal to 3. Discussed with patient and daughter that outpatient stress versus inpatient observation/possible stress are both reasonable ways to proceed. Patient and daughter feel trouble with discharge to home if second troponin is negative. I've spoken with cardiology who will arrange for outpatient stress testing. Patient has PCP, Dr. Tiburcio Pea, who can follow-up on results. Patient has negative EKG and negative troponin 1. Feel patient is low risk for ACS however she will need stress testing given risk factors and age to further delineate.  Patient seems reliable to return with worsening. She has close supervision from family.   Renne Crigler, PA-C 02/23/15 1614  Blane Ohara, MD 02/23/15 [phone]

## 2015-02-25 ENCOUNTER — Encounter: Payer: Self-pay | Admitting: *Deleted

## 2015-02-25 ENCOUNTER — Ambulatory Visit (INDEPENDENT_AMBULATORY_CARE_PROVIDER_SITE_OTHER): Payer: Medicare Other | Admitting: Cardiology

## 2015-02-25 ENCOUNTER — Encounter: Payer: Self-pay | Admitting: Cardiology

## 2015-02-25 VITALS — BP 118/62 | HR 58 | Ht 63.5 in | Wt 144.0 lb

## 2015-02-25 DIAGNOSIS — I10 Essential (primary) hypertension: Secondary | ICD-10-CM | POA: Diagnosis not present

## 2015-02-25 DIAGNOSIS — E785 Hyperlipidemia, unspecified: Secondary | ICD-10-CM

## 2015-02-25 DIAGNOSIS — R079 Chest pain, unspecified: Secondary | ICD-10-CM | POA: Diagnosis not present

## 2015-02-25 NOTE — Patient Instructions (Signed)
The current medical regimen is effective;  continue present plan and medications.  Your physician has requested that you have a lexiscan myoview. For further information please visit [email or web address]. Please follow instruction sheet, as given.  Follow up as needed with Dr Skains.  Thank you for choosing [facility]!!     

## 2015-02-25 NOTE — Progress Notes (Signed)
Cardiology Office Note   Date:  02/25/2015   ID:  Lindsay SickleBrenda S Standlee, DOB 22-May-1941, MRN [id number]  PCP:  Johny BlamerHARRIS, WILLIAM, MD  Cardiologist:   Donato SchultzSKAINS, Fiorela Pelzer, MD     History of Present Illness: [name] is a 74 y.o. female who presents for here for evaluation of chest pain.  Echocardiogram 02/28/2007 showed normal LV size and function, trivial tricuspid regurgitation, mild aortic sclerosis. EKG in 2008 showed sinus bradycardia rate 56 with nonspecific ST-T wave changes.  Has a history of hyperlipidemia, hypertension, anxiety. Currently on atorvastatin as well as aspirin, amlodipine, irbesartan. Never smoked.  Had chest pain, heaviness/fullness. Went away. Two days later, [employer], line dancing. In bed when felt pain. Non exertional.   This morning felt a flutter. Feels in her head. Boom boom. Ringing. No dyspnea. +dizzinesss. Vertigo.     Past Medical History  Diagnosis Date  . Hypertension   . Heart murmur   . Hyperlipidemia   . Osteoporosis   . Seasonal allergies     Past Surgical History  Procedure Laterality Date  . Fracture surgery       Current Outpatient Prescriptions  Medication Sig Dispense Refill  . acetaminophen (TYLENOL) 500 MG tablet Take 500 mg by mouth every 6 (six) hours as needed for mild pain.    Marland Kitchen. amLODipine (NORVASC) 5 MG tablet Take 5 mg by mouth daily.    Marland Kitchen. aspirin 81 MG tablet Take 160 mg by mouth daily.    Marland Kitchen. atorvastatin (LIPITOR) 20 MG tablet Take 20 mg by mouth daily.    . calcium-vitamin D (OSCAL) 250-125 MG-UNIT per tablet Take 1 tablet by mouth daily.    . irbesartan (AVAPRO) 150 MG tablet Take 150 mg by mouth at bedtime.    . Multiple Vitamin (MULTIVITAMIN) tablet Take 1 tablet by mouth daily.    Marland Kitchen. oxybutynin (DITROPAN-XL) 10 MG 24 hr tablet Take 10 mg by mouth 2 (two) times daily as needed.      No current facility-administered medications for this visit.    Allergies:   Aspirin high doses stomach irritated.    Social History:  The  patient  reports that she has never smoked. She does not have any smokeless tobacco history on file. She reports that she does not drink alcohol or use illicit drugs.   Family History:  The patient's family history includes Alzheimer's disease in her brother; CAD in her father; Cancer in her brother; Cancer - Colon in her brother; Diabetes in her brother, mother, sister, sister, and sister.    ROS:  Please see the history of present illness.   Otherwise, review of systems are positive for none.   All other systems are reviewed and negative.    PHYSICAL EXAM: VS:  BP 118/62 mmHg  Pulse 58  Ht 5' 3.5" (1.613 m)  Wt 144 lb (65.318 kg)  BMI 25.11 kg/m2 , BMI Body mass index is 25.11 kg/(m^2). GEN: Well nourished, well developed, in no acute distress HEENT: normal Neck: no JVD, mild radiation of heart murmur to carotids, no masses Cardiac: Bradycardic regular; 2/6 systolic murmur, no rubs, or gallops,no edema  Respiratory:  clear to auscultation bilaterally, normal work of breathing GI: soft, nontender, nondistended, + BS MS: no deformity or atrophy Skin: warm and dry, no rash Neuro:  Strength and sensation are intact Psych: euthymic mood, full affect   EKG:  49 bpm, STTW changes .   Recent Labs: 02/23/2015: B Natriuretic Peptide 125.0*; BUN 9; Creatinine  0.75; Hemoglobin 14.3; Platelets 182; Potassium 3.9; Sodium 142    Lipid Panel    Component Value Date/Time   CHOL  01/12/2008 0450    172        ATP III CLASSIFICATION:  <200     mg/dL   Desirable  098-119  mg/dL   Borderline High  >=147    mg/dL   High   TRIG 829 56/21/3086 0450   HDL 41 01/12/2008 0450   CHOLHDL 4.2 01/12/2008 0450   VLDL 20 01/12/2008 0450   LDLCALC * 01/12/2008 0450    111        Total Cholesterol/HDL:CHD Risk Coronary Heart Disease Risk Table                     Men   Women  1/2 Average Risk   3.4   3.3      Wt Readings from Last 3 Encounters:  02/25/15 144 lb (65.318 kg)  12/20/11 140 lb  (63.504 kg)      Other studies Reviewed: Additional studies/ records that were reviewed today include: Prior emergency room blood work, EKG, evaluation. Review of the above records demonstrates: As above   ASSESSMENT AND PLAN:  1.  Atypical chest pain-described as a heaviness, at rest. Could possibly be GI related/GERD however cannot exclude the possibility of anginal equivalent. Thankfully, she had not had any exertional component to this. We will proceed with nuclear stress test, she should be able to walk on the treadmill. If stress S2 is reassuring, I asked her to trial proton pump inhibitor over-the-counter.  2. Heart murmur-this is been worked up in the past. She does have hyperdynamic contraction. Aortic valve sclerosis was noted. This is likely contribute into her heart murmur as well as I do hear radiation to the carotids as well. Continue to encourage exercise, blood pressure control. She is doing a Insurance account manager job.  3. essential hypertension-medications reviewed. Doing well.   Current medicines are reviewed at length with the patient today.  The patient does not have concerns regarding medicines.  The following changes have been made:  no change  Labs/ tests ordered today include:   Orders Placed This Encounter  Procedures  . Myocardial Perfusion Imaging     Disposition:   I'll follow-up with stress test, in basis after that.  Mathews Robinsons, MD  02/25/2015 10:28 AM    [facility] [address] [place], [place], [place]  [address] Phone: [phone]; Fax: [phone]

## 2015-03-07 ENCOUNTER — Ambulatory Visit (HOSPITAL_COMMUNITY): Payer: Medicare Other | Attending: Cardiology | Admitting: Radiology

## 2015-03-07 DIAGNOSIS — R079 Chest pain, unspecified: Secondary | ICD-10-CM

## 2015-03-07 DIAGNOSIS — R0789 Other chest pain: Secondary | ICD-10-CM | POA: Diagnosis present

## 2015-03-07 DIAGNOSIS — I10 Essential (primary) hypertension: Secondary | ICD-10-CM | POA: Diagnosis not present

## 2015-03-07 MED ORDER — TECHNETIUM TC 99M SESTAMIBI GENERIC - CARDIOLITE
11.0000 | Freq: Once | INTRAVENOUS | Status: AC | PRN
  Administered 2015-03-07: 11 via INTRAVENOUS

## 2015-03-07 MED ORDER — TECHNETIUM TC 99M SESTAMIBI GENERIC - CARDIOLITE
33.0000 | Freq: Once | INTRAVENOUS | Status: AC | PRN
  Administered 2015-03-07: 33 via INTRAVENOUS

## 2015-03-07 NOTE — Progress Notes (Signed)
[facility] SITE 3 NUCLEAR MED [address] [place], [place] [address] [phone]    Cardiology Nuclear Med Study  Lindsay SickleBrenda S Foley is a 74 y.o. female     MRN : [id number]     DOB: [birth year]  Procedure Date: 03/07/2015  Nuclear Med Background Indication for Stress Test:  Evaluation for Ischemia and 3/16 ER -CP, Normal Enzymes History:  No known CAD Cardiac Risk Factors: Hypertension  Symptoms:  Chest Tightness (last date of chest discomfort was two weeks ago)   Nuclear Pre-Procedure Caffeine/Decaff Intake:  None NPO After: 8:00pm   Lungs:  clear O2 Sat: 97% on room air. IV 0.9% NS with Angio Cath:  22g  IV Site: R Antecubital  IV Started by:  Bonnita LevanJackie Lashaunda Schild, RN  Chest Size (in):  36 Cup Size: C  Height: 5\' 3"  (1.6 m)  Weight:  146 lb (66.225 kg)  BMI:  Body mass index is 25.87 kg/(m^2). Tech Comments:  N/A    Nuclear Med Study 1 or 2 day study: 1 day  Stress Test Type:  Stress  Reading MD: N/A  Order Authorizing Provider:  Donato SchultzMark Skains, MD  Resting Radionuclide: Technetium 370m Sestamibi  Resting Radionuclide Dose: 11.0 mCi   Stress Radionuclide:  Technetium 5570m Sestamibi  Stress Radionuclide Dose: 33.0 mCi           Stress Protocol Rest HR: 52 Stress HR: 144  Rest BP: 147/61 Stress BP: 189/78  Exercise Time (min): 4:30 METS: 4.6   Predicted Max HR: 147 bpm % Max HR: 97.96 bpm Rate Pressure Product: [id number]   Dose of Adenosine (mg):  n/a Dose of Lexiscan: n/a mg  Dose of Atropine (mg): n/a Dose of Dobutamine: n/a mcg/kg/min (at max HR)  Stress Test Technologist: Nelson ChimesSharon Brooks, BS-ES  Nuclear Technologist:  Kerby NoraElzbieta Kubak, CNMT     Rest Procedure:  Myocardial perfusion imaging was performed at rest 45 minutes following the intravenous administration of Technetium 7870m Sestamibi. Rest ECG: NSR - Normal EKG  Stress Procedure:  The patient exercised on the treadmill utilizing the Bruce Protocol for 4:30 minutes. The patient stopped due to fatigue and denied  any chest pain.  Technetium 3470m Sestamibi was injected at peak exercise and myocardial perfusion imaging was performed after a brief delay. Stress ECG: Insignificant upsloping ST segment depression.  QPS Raw Data Images:  Normal; no motion artifact; normal heart/lung ratio. Stress Images:  Normal homogeneous uptake in all areas of the myocardium. Rest Images:  Normal homogeneous uptake in all areas of the myocardium. Subtraction (SDS):  No evidence of ischemia. Transient Ischemic Dilatation (Normal <1.22):  0.93 Lung/Heart Ratio (Normal <0.45):  0.33  Quantitative Gated Spect Images QGS EDV:  67 ml QGS ESV:  15 ml  Impression Exercise Capacity:  Fair exercise capacity. BP Response:  only one BP recording during ecercise shows a marked drop in BP, but immediate post-exercise BP is appropriately elevated. The accuracy of BP during the brief exercise is uncertain  Clinical Symptoms:  No significant symptoms noted. ECG Impression:  Insignificant upsloping ST segment depression. Comparison with Prior Nuclear Study: No previous nuclear study performed  Overall Impression:  Low risk stress nuclear study with possible exercise induced drop in BP of uncertain accuracy and uncertain significance. Normal perfusion images.  LV Ejection Fraction: 77%.  LV Wall Motion:  NL LV Function; NL Wall Motion   Thurmon FairMihai Croitoru, MD, [facility] [phone] office [phone] pager

## 2015-03-10 ENCOUNTER — Telehealth: Payer: Self-pay | Admitting: Cardiology

## 2015-03-10 NOTE — Telephone Encounter (Signed)
Informed the pt that per Dr Anne FuSkains the pts myoview results are overall reassuring, low risk, with no ischemia. Pt verbalized understanding and pleased with this news.

## 2015-03-10 NOTE — Telephone Encounter (Signed)
New message ° ° ° ° ° ° ° °Pt calling for test results °

## 2015-04-22 ENCOUNTER — Emergency Department (HOSPITAL_COMMUNITY)
Admission: EM | Admit: 2015-04-22 | Discharge: 2015-04-22 | Disposition: A | Discharge status: Home / Self Care | Payer: Medicare Other | Source: Home / Self Care | Attending: Emergency Medicine | Admitting: Emergency Medicine

## 2015-04-22 ENCOUNTER — Emergency Department (INDEPENDENT_AMBULATORY_CARE_PROVIDER_SITE_OTHER): Payer: Medicare Other

## 2015-04-22 ENCOUNTER — Encounter (HOSPITAL_COMMUNITY): Payer: Self-pay | Admitting: Emergency Medicine

## 2015-04-22 DIAGNOSIS — S92911A Unspecified fracture of right toe(s), initial encounter for closed fracture: Secondary | ICD-10-CM

## 2015-04-22 NOTE — ED Notes (Signed)
Pt states that she fell on 04/05/2015 and that her right toes and ankle still hurt her when she stands on it.

## 2015-04-22 NOTE — ED Provider Notes (Signed)
CSN: [id number]     Arrival date & time 04/22/15  [id number] History   First MD Initiated Contact with Patient 04/22/15 [phone]     Chief Complaint  Patient presents with  . Fall   (Consider location/radiation/quality/duration/timing/severity/associated sxs/prior Treatment) HPI  She is a 74 year old woman here for evaluation of right ankle and foot pain. She states she fell to a half weeks ago. She states she was walking down her stairs and when she got to the bottom of the stairs her right foot slipped out from under her. She reports immediate pain and swelling in the lateral part of her foot. She treated it at home with hot and cold compresses. Her symptoms have mostly improved. However, she states she continues to have pain and swelling, particularly after being on her feet. The pain is located at the base of the fourth and fifth toes. She also states her ankle will feel like it's about to give out on her at times, but it does not.  Past Medical History  Diagnosis Date  . Hypertension   . Heart murmur   . Hyperlipidemia   . Osteoporosis   . Seasonal allergies    Past Surgical History  Procedure Laterality Date  . Fracture surgery     Family History  Problem Relation Age of Onset  . Diabetes Mother   . CAD Father   . Diabetes Sister   . Diabetes Brother   . Diabetes Sister   . Diabetes Sister   . Cancer - Colon Brother   . Cancer Brother   . Alzheimer's disease Brother    History  Substance Use Topics  . Smoking status: Never Smoker   . Smokeless tobacco: Not on file  . Alcohol Use: No   OB History    No data available     Review of Systems As in history of present illness Allergies  Aspirin  Home Medications   Prior to Admission medications   Medication Sig Start Date End Date Taking? Authorizing Provider  acetaminophen (TYLENOL) 500 MG tablet Take 500 mg by mouth every 6 (six) hours as needed for mild pain.    Historical Provider, MD  amLODipine (NORVASC) 5 MG tablet  Take 5 mg by mouth daily.    Historical Provider, MD  aspirin 81 MG tablet Take 160 mg by mouth daily.    Historical Provider, MD  atorvastatin (LIPITOR) 20 MG tablet Take 20 mg by mouth daily.    Historical Provider, MD  calcium-vitamin D (OSCAL) 250-125 MG-UNIT per tablet Take 1 tablet by mouth daily.    Historical Provider, MD  irbesartan (AVAPRO) 150 MG tablet Take 150 mg by mouth at bedtime.    Historical Provider, MD  Multiple Vitamin (MULTIVITAMIN) tablet Take 1 tablet by mouth daily.    Historical Provider, MD  oxybutynin (DITROPAN-XL) 10 MG 24 hr tablet Take 10 mg by mouth 2 (two) times daily as needed.     Historical Provider, MD   BP 147/82 mmHg  Pulse 51  Temp(Src) 97.9 F (36.6 C) (Oral)  Resp 16  SpO2 97% Physical Exam  Constitutional: She is oriented to person, place, and time. She appears well-developed and well-nourished. No distress.  Cardiovascular: Normal rate.   Pulmonary/Chest: Effort normal.  Musculoskeletal:  Right ankle: No erythema or edema. No point tenderness. No appreciable joint laxity. She has full range of motion. She has good strength in the ankle. Right foot: Some mild swelling along the distal lateral foot. She is tender at  the fourth and fifth MTP joints. She has a 2+ DP pulse. Brisk cap refill in all digits. She has full range of motion of her toes.  Neurological: She is alert and oriented to person, place, and time.    ED Course  Procedures (including critical care time) Labs Review Labs Reviewed - No data to display  Imaging Review Dg Ankle Complete Right  04/22/2015   CLINICAL DATA:  Right ankle pain and swelling after falling down stairs. Initial encounter.  EXAM: RIGHT ANKLE - COMPLETE 3+ VIEW  COMPARISON:  None.  FINDINGS: The bones are mildly demineralized. There are cortical screws within the distal fibula without surrounding lucency. No evidence of acute fracture or dislocation. There is mild tibiotalar joint space loss. There is a small  plantar calcaneal spur. No significant soft tissue swelling.  IMPRESSION: No acute osseous findings. Postsurgical changes and osteopenia as described.   Electronically Signed   By: Carey Bullocks M.D.   On: 04/22/2015 10:53   Dg Foot Complete Right  04/22/2015   CLINICAL DATA:  Patient fell wall walking down steps.  Pain  EXAM: RIGHT FOOT COMPLETE - 3+ VIEW  COMPARISON:  None.  FINDINGS: Frontal, oblique, and lateral views obtained. There is postoperative change in the distal fibular region. There are fractures of the distal aspects of the fourth and fifth proximal phalanges with slight lateral angulation of the distal aspect of the fourth proximal phalanx in essentially anatomic alignment at the site of a fracture of the distal portion of the fifth proximal phalanx. No other fractures. No dislocation there is mild narrowing of all PIP and DIP joints as well as the first MTP joint. No erosive change.  IMPRESSION: Fractures of the distal aspects of the fourth and fifth proximal phalanges. Slight angulation at the fracture site of the fourth proximal phalanx fracture with essentially anatomic alignment at the fifth proximal phalanx fracture site. No other fractures. No dislocation. Postoperative change distal fibula.   Electronically Signed   By: Bretta Bang III M.D.   On: 04/22/2015 10:54     MDM   1. Toe fracture, right, closed, initial encounter    Postop shoe given. Follow-up as needed.    Charm Rings, MD 04/22/15 [phone]

## 2015-04-22 NOTE — Discharge Instructions (Signed)
You broke your fourth and fifth toes. Wear the postop shoe for the next 2 weeks. After 2 weeks, he can gradually come out of the postop shoe. If you are having pain in your regular shoes, that means you should go back in the postop shoe. Apply ice if the foot swells. You can take Tylenol as needed for pain. This should heal well over the next 2-4 weeks. Follow-up as needed.

## 2015-07-07 IMAGING — DX DG FOOT COMPLETE 3+V*R*
3 series · 3 of 3 positions shown · non-contrast
Comparison: None.

CLINICAL DATA: Patient fell wall walking down steps.  Pain

EXAM:
RIGHT FOOT COMPLETE - 3+ VIEW

[foot ap]
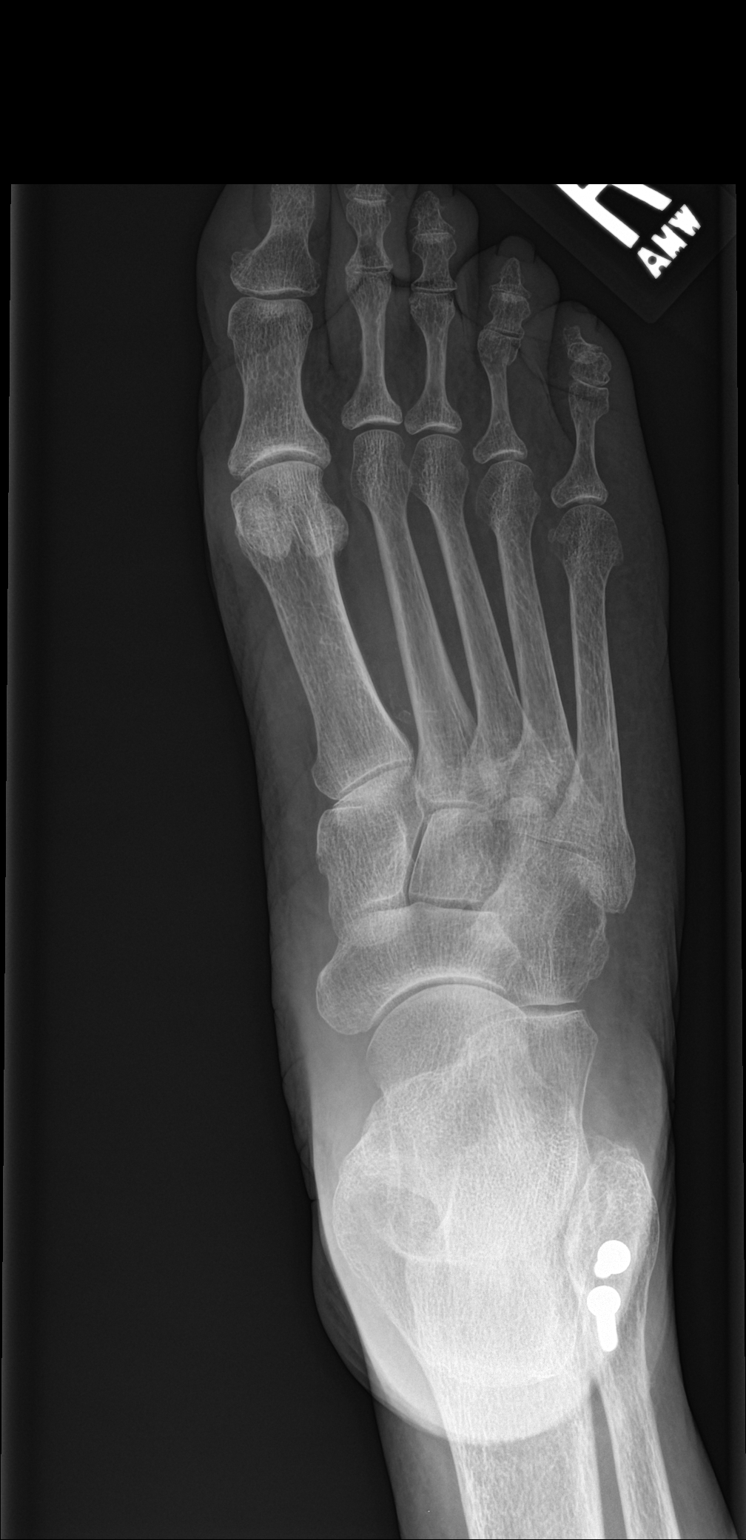

[foot obl]
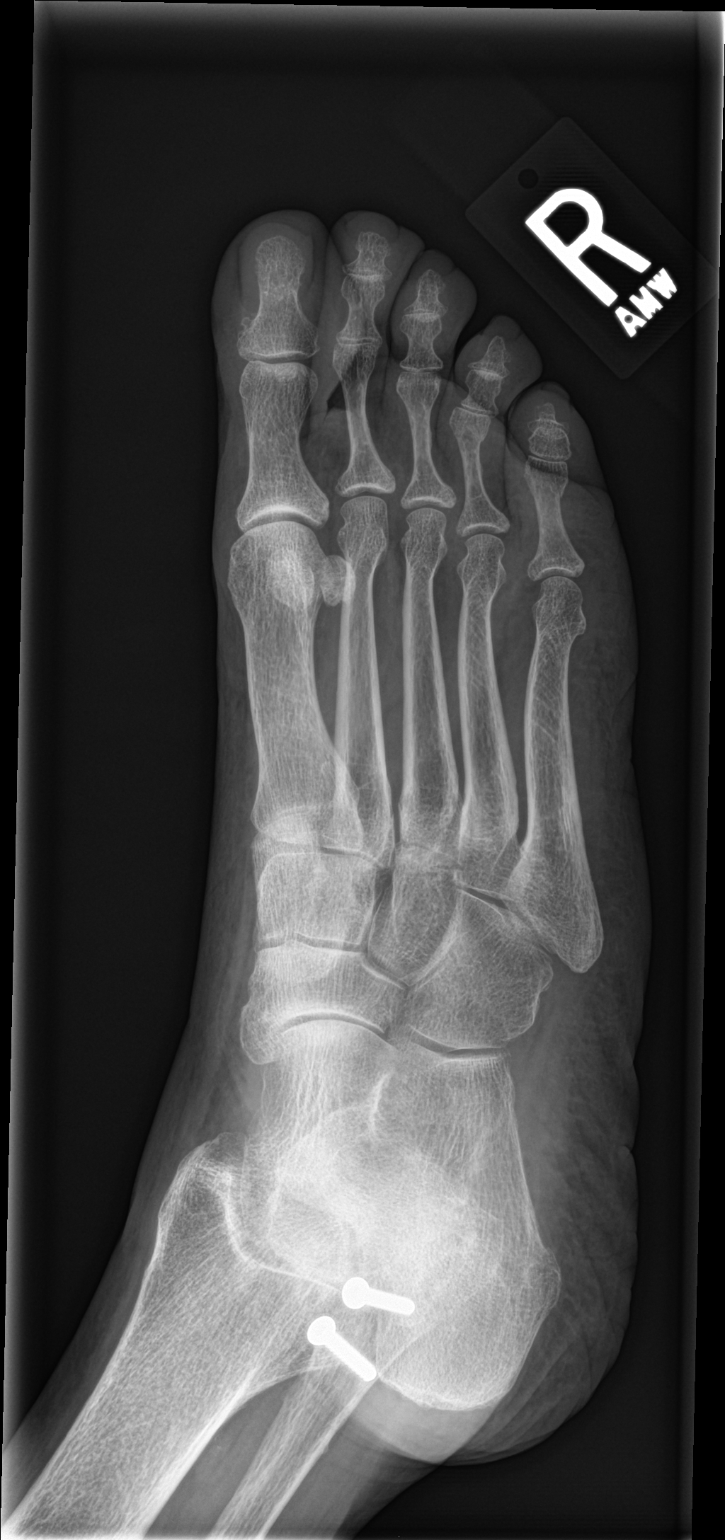

[foot lat]
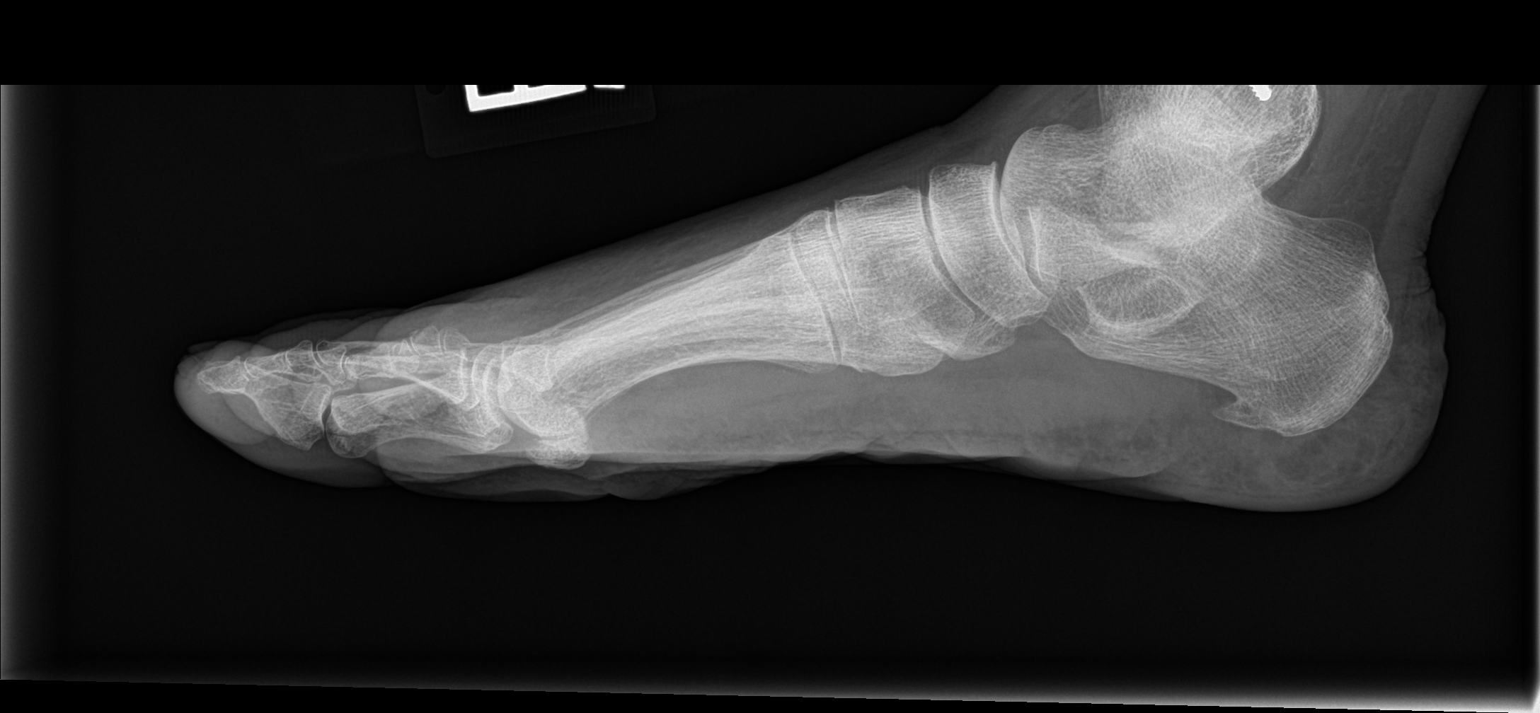

[3 of 3 positions shown; findings below may reference images not displayed]

FINDINGS: Frontal, oblique, and lateral views obtained. There is postoperative
change in the distal fibular region. There are fractures of the
distal aspects of the fourth and fifth proximal phalanges with
slight lateral angulation of the distal aspect of the fourth
proximal phalanx in essentially anatomic alignment at the site of a
fracture of the distal portion of the fifth proximal phalanx. No
other fractures. No dislocation there is mild narrowing of all PIP
and DIP joints as well as the first MTP joint. No erosive change.
IMPRESSION: Fractures of the distal aspects of the fourth and fifth proximal
phalanges. Slight angulation at the fracture site of the fourth
proximal phalanx fracture with essentially anatomic alignment at the
fifth proximal phalanx fracture site. No other fractures. No
dislocation. Postoperative change distal fibula.

## 2016-10-10 ENCOUNTER — Telehealth: Payer: Self-pay

## 2016-10-10 NOTE — Telephone Encounter (Signed)
NOTES SENT TO SCHEDULING 10/10/16 RJ

## 2016-10-12 ENCOUNTER — Encounter: Payer: Self-pay | Admitting: Cardiology

## 2016-10-15 ENCOUNTER — Ambulatory Visit (INDEPENDENT_AMBULATORY_CARE_PROVIDER_SITE_OTHER): Payer: Medicare Other | Admitting: Cardiology

## 2016-10-15 ENCOUNTER — Encounter: Payer: Self-pay | Admitting: Cardiology

## 2016-10-15 ENCOUNTER — Encounter (INDEPENDENT_AMBULATORY_CARE_PROVIDER_SITE_OTHER): Payer: Self-pay

## 2016-10-15 VITALS — BP 158/78 | HR 73 | Ht 63.0 in | Wt 151.8 lb

## 2016-10-15 DIAGNOSIS — R55 Syncope and collapse: Secondary | ICD-10-CM

## 2016-10-15 NOTE — Patient Instructions (Signed)
Medication Instructions:  The current medical regimen is effective;  continue present plan and medications.  Testing/Procedures: Your physician has requested that you have an echocardiogram. Echocardiography is a painless test that uses sound waves to create images of your heart. It provides your doctor with information about the size and shape of your heart and how well your heart's chambers and valves are working. This procedure takes approximately one hour. There are no restrictions for this procedure.  Your physician has recommended that you wear an event monitor for 30 days. Event monitors are medical devices that record the heart's electrical activity. Doctors most often us these monitors to diagnose arrhythmias. Arrhythmias are problems with the speed or rhythm of the heartbeat. The monitor is a small, portable device. You can wear one while you do your normal daily activities. This is usually used to diagnose what is causing palpitations/syncope (passing out).  Follow-Up: Follow up in 6 months with Dr. Skains.  You will receive a letter in the mail 2 months before you are due.  Please call us when you receive this letter to schedule your follow up appointment.  If you need a refill on your cardiac medications before your next appointment, please call your pharmacy.  Thank you for choosing [facility]!!     

## 2016-10-15 NOTE — Progress Notes (Signed)
Cardiology Office Note   Date:  10/15/2016   ID:  [name], DOB [birth year], MRN [id number]  PCP:  Johny BlamerHARRIS, WILLIAM, MD  Cardiologist:   Donato SchultzMark Skains, MD     History of Present Illness: [name] is a 75 y.o. female who presents for follow up. All of a sudden felt faint while standing in the kitchen after coming down the stairs to get some water. Few seconds out. Felt very sweaty, heart pounded. 3rd time she fainted. They have all occurred in a standing position. She has not been driving since. She lives with her daughter.  First episode happened when trying to rush to bathroom with incontinence.   Echocardiogram 02/28/2007 showed normal LV size and function, trivial tricuspid regurgitation, mild aortic sclerosis. EKG in 2008 showed sinus bradycardia rate 56 with nonspecific ST-T wave changes.  Has a history of hyperlipidemia, hypertension, anxiety. Never smoked.   Past Medical History:  Diagnosis Date  . Heart murmur   . Hyperlipidemia   . Hypertension   . Osteoporosis   . Seasonal allergies     Past Surgical History:  Procedure Laterality Date  . FRACTURE SURGERY       Current Outpatient Prescriptions  Medication Sig Dispense Refill  . aspirin 81 MG tablet Take 160 mg by mouth daily.    Marland Kitchen. atorvastatin (LIPITOR) 20 MG tablet Take 20 mg by mouth daily.    . calcium-vitamin D (OSCAL) 250-125 MG-UNIT per tablet Take 1 tablet by mouth daily.    Marland Kitchen. ibuprofen (ADVIL,MOTRIN) 200 MG tablet Take 200 mg by mouth every 6 (six) hours as needed.    . irbesartan (AVAPRO) 150 MG tablet Take 150 mg by mouth at bedtime.    . Multiple Vitamin (MULTIVITAMIN) tablet Take 1 tablet by mouth daily.    Marland Kitchen. oxybutynin (DITROPAN-XL) 10 MG 24 hr tablet Take 10 mg by mouth 2 (two) times daily as needed.      No current facility-administered medications for this visit.     Allergies:   Aspirin high doses stomach irritated.    Social History:  The patient  reports that she has never  smoked. She has never used smokeless tobacco. She reports that she does not drink alcohol or use drugs.   Family History:  The patient's family history includes Alzheimer's disease in her brother; CAD in her father; Cancer in her brother; Cancer - Colon in her brother; Diabetes in her brother, mother, sister, sister, and sister.    ROS:  Please see the history of present illness.   Otherwise, review of systems are positive for none.   All other systems are reviewed and negative.    PHYSICAL EXAM: VS:  BP (!) 158/78   Pulse 73   Ht 5\' 3"  (1.6 m)   Wt 151 lb 12.8 oz (68.9 kg)   LMP  (LMP Unknown)   BMI 26.89 kg/m  , BMI Body mass index is 26.89 kg/m. GEN: Well nourished, well developed, in no acute distress  HEENT: normal  Neck: no JVD, mild radiation of heart murmur to carotids, no masses Cardiac: Bradycardic regular; 2/6 systolic murmur, no rubs, or gallops,no edema  Respiratory:  clear to auscultation bilaterally, normal work of breathing GI: soft, nontender, nondistended, + BS MS: no deformity or atrophy  Skin: warm and dry, no rash Neuro:  Strength and sensation are intact Psych: euthymic mood, full affect   EKG:  EKG ordered today shows 10/15/16-sinus rhythm, 73, possible old inferior infarct pattern.  No significant change-49 bpm, STTW changes .   Recent Labs: No results found for requested labs within last 8760 hours.    Lipid Panel    Component Value Date/Time   CHOL  01/12/2008 0450    172        ATP III CLASSIFICATION:  <200     mg/dL   Desirable  [id number]  mg/dL   Borderline High  >=045>=240    mg/dL   High   TRIG 409101 81/19/147802/16/2009 0450   HDL 41 01/12/2008 0450   CHOLHDL 4.2 01/12/2008 0450   VLDL 20 01/12/2008 0450   LDLCALC (H) 01/12/2008 0450    111        Total Cholesterol/HDL:CHD Risk Coronary Heart Disease Risk Table                     Men   Women  1/2 Average Risk   3.4   3.3      Wt Readings from Last 3 Encounters:  10/15/16 151 lb 12.8 oz (68.9 kg)   03/07/15 146 lb (66.2 kg)  02/25/15 144 lb (65.3 kg)      Other studies Reviewed: Additional studies/ records that were reviewed today include: Prior emergency room blood work, EKG, evaluation. Review of the above records demonstrates: As above  Nuclear stress test 03/08/15-low risk, no ischemia  ASSESSMENT AND PLAN:  Syncope-she had an episode where she came down the stairs TEE was at the sink and fell straight backwards.  Atypical chest pain-described as a heaviness, at rest. Could possibly be GI related/GERD however cannot exclude the possibility of anginal equivalent. Thankfully, she had not had any exertional component to this. We will proceed with nuclear stress test, she should be able to walk on the treadmill. If stress S2 is reassuring, I asked her to trial proton pump inhibitor over-the-counter.  Heart murmur-this is been worked up in the past. She does have hyperdynamic contraction. Aortic valve sclerosis was noted. This is likely contribute into her heart murmur as well as I do hear radiation to the carotids as well. With her episode of syncope, we will recheck to make sure that aortic stenosis has not developed.  Essential hypertension-medications reviewed. Elevated currently but we have to be careful not to over treat given her episodes of syncope.  Fatigue blood -  work reviewed from Dr. Tiburcio PeaHarris, all reassuring including TSH, hemoglobin.    Current medicines are reviewed at length with the patient today.  The patient does not have concerns regarding medicines.  We will see her back in 6 months, we will review the results of monitor before then and echocardiogram as well.   Signed, Donato SchultzMark Skains, MD  10/15/2016 3:19 PM    [facility] [address], [place], [place]  [address] Phone: [phone]; Fax: [phone]

## 2016-10-16 ENCOUNTER — Ambulatory Visit (HOSPITAL_COMMUNITY): Payer: Medicare Other | Attending: Cardiovascular Disease

## 2016-10-16 ENCOUNTER — Other Ambulatory Visit: Payer: Self-pay

## 2016-10-16 DIAGNOSIS — R55 Syncope and collapse: Secondary | ICD-10-CM

## 2016-10-16 DIAGNOSIS — I517 Cardiomegaly: Secondary | ICD-10-CM | POA: Insufficient documentation

## 2016-10-16 DIAGNOSIS — I351 Nonrheumatic aortic (valve) insufficiency: Secondary | ICD-10-CM | POA: Diagnosis not present

## 2016-10-24 ENCOUNTER — Ambulatory Visit (INDEPENDENT_AMBULATORY_CARE_PROVIDER_SITE_OTHER): Payer: Medicare Other

## 2016-10-24 DIAGNOSIS — R55 Syncope and collapse: Secondary | ICD-10-CM

## 2016-11-17 ENCOUNTER — Emergency Department (HOSPITAL_COMMUNITY): Payer: Medicare Other

## 2016-11-17 ENCOUNTER — Emergency Department (HOSPITAL_COMMUNITY)
Admission: EM | Admit: 2016-11-17 | Discharge: 2016-11-17 | Disposition: A | Discharge status: Home / Self Care | Payer: Medicare Other | Attending: Emergency Medicine | Admitting: Emergency Medicine

## 2016-11-17 ENCOUNTER — Encounter (HOSPITAL_COMMUNITY): Payer: Self-pay | Admitting: Emergency Medicine

## 2016-11-17 DIAGNOSIS — I10 Essential (primary) hypertension: Secondary | ICD-10-CM | POA: Diagnosis not present

## 2016-11-17 DIAGNOSIS — Z7982 Long term (current) use of aspirin: Secondary | ICD-10-CM | POA: Insufficient documentation

## 2016-11-17 DIAGNOSIS — Z79899 Other long term (current) drug therapy: Secondary | ICD-10-CM | POA: Insufficient documentation

## 2016-11-17 DIAGNOSIS — M79622 Pain in left upper arm: Secondary | ICD-10-CM | POA: Insufficient documentation

## 2016-11-17 DIAGNOSIS — R002 Palpitations: Secondary | ICD-10-CM | POA: Diagnosis not present

## 2016-11-17 LAB — CBC
HEMATOCRIT: 41.6 % (ref 36.0–46.0)
HEMOGLOBIN: 13.6 g/dL (ref 12.0–15.0)
MCH: 27.6 pg (ref 26.0–34.0)
MCHC: 32.7 g/dL (ref 30.0–36.0)
MCV: 84.4 fL (ref 78.0–100.0)
Platelets: 204 10*3/uL (ref 150–400)
RBC: 4.93 MIL/uL (ref 3.87–5.11)
RDW: 15.5 % (ref 11.5–15.5)
WBC: 6.5 10*3/uL (ref 4.0–10.5)

## 2016-11-17 LAB — DIFFERENTIAL
BASOS ABS: 0 10*3/uL (ref 0.0–0.1)
BASOS PCT: 1 %
Eosinophils Absolute: 0.2 10*3/uL (ref 0.0–0.7)
Eosinophils Relative: 3 %
LYMPHS PCT: 23 %
Lymphs Abs: 1.5 10*3/uL (ref 0.7–4.0)
MONO ABS: 0.5 10*3/uL (ref 0.1–1.0)
Monocytes Relative: 7 %
NEUTROS ABS: 4.5 10*3/uL (ref 1.7–7.7)
Neutrophils Relative %: 66 %

## 2016-11-17 LAB — BASIC METABOLIC PANEL
ANION GAP: 9 (ref 5–15)
BUN: 10 mg/dL (ref 6–20)
CO2: 24 mmol/L (ref 22–32)
Calcium: 10.2 mg/dL (ref 8.9–10.3)
Chloride: 107 mmol/L (ref 101–111)
Creatinine, Ser: 0.75 mg/dL (ref 0.44–1.00)
GLUCOSE: 101 mg/dL — AB (ref 65–99)
POTASSIUM: 4.2 mmol/L (ref 3.5–5.1)
Sodium: 140 mmol/L (ref 135–145)

## 2016-11-17 LAB — I-STAT TROPONIN, ED: TROPONIN I, POC: 0 ng/mL (ref 0.00–0.08)

## 2016-11-17 MED ORDER — ASPIRIN 81 MG PO CHEW
162.0000 mg | CHEWABLE_TABLET | Freq: Once | ORAL | Status: AC
  Administered 2016-11-17: 162 mg via ORAL
  Filled 2016-11-17: qty 2

## 2016-11-17 MED ORDER — CELECOXIB 100 MG PO CAPS: 100.0000 mg | ORAL_CAPSULE | Freq: Two times a day (BID) | ORAL | 0 refills | Status: DC

## 2016-11-17 MED ORDER — NITROGLYCERIN 0.4 MG SL SUBL
0.4000 mg | SUBLINGUAL_TABLET | SUBLINGUAL | Status: DC | PRN
  Administered 2016-11-17: 0.4 mg via SUBLINGUAL
  Filled 2016-11-17: qty 1

## 2016-11-17 MED ORDER — KETOROLAC TROMETHAMINE 60 MG/2ML IM SOLN
30.0000 mg | Freq: Once | INTRAMUSCULAR | Status: AC
  Administered 2016-11-17: 30 mg via INTRAMUSCULAR
  Filled 2016-11-17: qty 2

## 2016-11-17 NOTE — ED Triage Notes (Signed)
Pt reports sudden onset heart racing and L arm pain starting around 0300 this morning. States she went to bathroom and noted after using bathroom. Denies CP, SHOB at this time, NAD.   Cardiologist - Dione HousekeeperSkaines. Pt has heart monitor in place currently for syncopal episodes, states  Its been on for 30 days.

## 2016-11-17 NOTE — ED Provider Notes (Signed)
[facility]-EMERGENCY DEPT Provider Note   CSN: [id number] Arrival date & time: 11/17/16  [id number]     History   Chief Complaint Chief Complaint  Patient presents with  . Tachycardia  . Arm Pain    HPI [name] is a 75 y.o. female.  She woke up at 3:30 this morning to urinate. After walking to the bathroom, and bacteria bed, she noticed that her heart was racing and she had a dull pain in her left arm. The heart racing only lasted a few minutes, but the left arm pain has persisted. It is a dull pain which he rates at 3/10. Nothing makes it better nothing makes it worse. There is no associated dyspnea, nausea, diaphoresis. Of note, she does have a heart monitor which was applied to evaluate syncope.   The history is provided by the patient.  Arm Pain     Past Medical History:  Diagnosis Date  . Heart murmur   . Hyperlipidemia   . Hypertension   . Osteoporosis   . Seasonal allergies     Patient Active Problem List   Diagnosis Date Noted  . HYPERCHOLESTEROLEMIA 01/23/2007  . HYPERTENSION, BENIGN SYSTEMIC 01/23/2007  . 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 OSTEOPOROSIS, UNSPECIFIED 01/23/2007  . INCONTINENCE, URGE 01/23/2007    Past Surgical History:  Procedure Laterality Date  . FRACTURE SURGERY      OB History    No data available       Home Medications    Prior to Admission medications   Medication Sig Start Date End Date Taking? Authorizing Provider  aspirin 81 MG tablet Take 160 mg by mouth daily.    Historical Provider, MD  atorvastatin (LIPITOR) 20 MG tablet Take 20 mg by mouth daily.    Historical Provider, MD  calcium-vitamin D (OSCAL) 250-125 MG-UNIT per tablet Take 1 tablet by mouth daily.    Historical Provider, MD  ibuprofen (ADVIL,MOTRIN) 200 MG tablet Take 200 mg by mouth every 6 (six) hours as needed.    Historical Provider, MD  irbesartan (AVAPRO) 150 MG tablet Take 150 mg by mouth at bedtime.    Historical Provider, MD  Multiple Vitamin (MULTIVITAMIN)  tablet Take 1 tablet by mouth daily.    Historical Provider, MD  oxybutynin (DITROPAN-XL) 10 MG 24 hr tablet Take 10 mg by mouth 2 (two) times daily as needed.     Historical Provider, MD    Family History Family History  Problem Relation Age of Onset  . Diabetes Mother   . CAD Father   . Diabetes Sister   . Diabetes Brother   . Diabetes Sister   . Diabetes Sister   . Cancer - Colon Brother   . Cancer Brother   . Alzheimer's disease Brother     Social History Social History  Substance Use Topics  . Smoking status: Never Smoker  . Smokeless tobacco: Never Used  . Alcohol use No     Allergies   Aspirin   Review of Systems Review of Systems  All other systems reviewed and are negative.    Physical Exam Updated Vital Signs BP 145/65 (BP Location: Right Arm)   Pulse 65   Resp 22   Ht 5\' 3"  (1.6 m)   Wt 147 lb (66.7 kg)   LMP  (LMP Unknown)   SpO2 99%   BMI 26.04 kg/m   Physical Exam  Nursing note and vitals reviewed.  75 year old female, resting comfortably and in no acute distress. Vital signs  are Significant for borderline hypertension. Oxygen saturation is 99%, which is normal. Head is normocephalic and atraumatic. PERRLA, EOMI. Oropharynx is clear. Neck is nontender and supple without adenopathy or JVD. Back is nontender and there is no CVA tenderness. Lungs are clear without rales, wheezes, or rhonchi. Chest is nontender. Heart has regular rate and rhythm without murmur. Abdomen is soft, flat, nontender without masses or hepatosplenomegaly and peristalsis is normoactive. Extremities have no cyanosis or edema, full range of motion is present. There is no tenderness in the left arm. Skin is warm and dry without rash. Neurologic: Mental status is normal, cranial nerves are intact, there are no motor or sensory deficits.  ED Treatments / Results  Labs (all labs ordered are listed, but only abnormal results are displayed) Labs Reviewed  BASIC METABOLIC  PANEL  CBC  DIFFERENTIAL  I-STAT TROPOININ, ED    EKG  EKG Interpretation  Date/Time:  Saturday November 17 2016 05:37:17 EST Ventricular Rate:  67 PR Interval:  178 QRS Duration: 72 QT Interval:  394 QTC Calculation: 416 R Axis:   10 Text Interpretation:  Sinus rhythm with Premature atrial complexes Otherwise normal ECG When compared with ECG of 02/23/2015, HEART RATE has increased Confirmed by Preston FleetingGLICK  MD, Gizelle Whetsel ([id number]) on 11/17/2016 6:12:31 AM       Radiology No results found.  Procedures Procedures (including critical care time)  Medications Ordered in ED Medications  aspirin chewable tablet 162 mg (not administered)  nitroGLYCERIN (NITROSTAT) SL tablet 0.4 mg (not administered)     Initial Impression / Assessment and Plan / ED Course  I have reviewed the triage vital signs and the nursing notes.  Pertinent labs & imaging results that were available during my care of the patient were reviewed by me and considered in my medical decision making (see chart for details).  Clinical Course    Transient episode of palpitations with persistent left arm pain. No ECG changes, but will check troponin and give a trial of nitroglycerin to see if this is an anginal equivalent. Old records are reviewed showing that she has a loop recorder to evaluate syncopal episodes that she has had.  Laboratory workup is unremarkable. She had no relief of discomfort with nitroglycerin glycerin. She is given a dose of ketorolac with excellent relief of pain. I recommended discharge home on naproxen, but she states she cannot tolerate naproxen from a GI standpoint. We'll give her a trial of celecoxib. She is also to follow-up with her cardiologist to see if her loop recorder has identified any arrhythmias.  Final Clinical Impressions(s) / ED Diagnoses   Final diagnoses:  Palpitations  Pain in left upper arm    New Prescriptions New Prescriptions   CELECOXIB (CELEBREX) 100 MG CAPSULE    Take 1  capsule (100 mg total) by mouth 2 (two) times daily after a meal.     Dione Boozeavid Allia Wiltsey, MD 11/17/16 [phone]

## 2016-12-26 ENCOUNTER — Ambulatory Visit: Payer: Medicare Other | Admitting: Cardiology

## 2017-02-26 ENCOUNTER — Encounter (HOSPITAL_COMMUNITY): Payer: Self-pay | Admitting: Emergency Medicine

## 2017-02-26 ENCOUNTER — Emergency Department (HOSPITAL_COMMUNITY)
Admission: EM | Admit: 2017-02-26 | Discharge: 2017-02-26 | Disposition: A | Discharge status: Home / Self Care | Payer: Medicare Other | Attending: Emergency Medicine | Admitting: Emergency Medicine

## 2017-02-26 DIAGNOSIS — Z79899 Other long term (current) drug therapy: Secondary | ICD-10-CM | POA: Insufficient documentation

## 2017-02-26 DIAGNOSIS — I10 Essential (primary) hypertension: Secondary | ICD-10-CM | POA: Insufficient documentation

## 2017-02-26 DIAGNOSIS — Z7982 Long term (current) use of aspirin: Secondary | ICD-10-CM | POA: Diagnosis not present

## 2017-02-26 DIAGNOSIS — N10 Acute tubulo-interstitial nephritis: Secondary | ICD-10-CM | POA: Diagnosis not present

## 2017-02-26 DIAGNOSIS — R109 Unspecified abdominal pain: Secondary | ICD-10-CM | POA: Diagnosis present

## 2017-02-26 LAB — URINALYSIS, ROUTINE W REFLEX MICROSCOPIC
Bilirubin Urine: NEGATIVE
GLUCOSE, UA: NEGATIVE mg/dL
Ketones, ur: NEGATIVE mg/dL
NITRITE: POSITIVE — AB
Protein, ur: 100 mg/dL — AB
SPECIFIC GRAVITY, URINE: 1.014 (ref 1.005–1.030)
Squamous Epithelial / LPF: NONE SEEN
pH: 5 (ref 5.0–8.0)

## 2017-02-26 LAB — COMPREHENSIVE METABOLIC PANEL
ALK PHOS: 102 U/L (ref 38–126)
ALT: 22 U/L (ref 14–54)
ANION GAP: 11 (ref 5–15)
AST: 28 U/L (ref 15–41)
Albumin: 3.8 g/dL (ref 3.5–5.0)
BILIRUBIN TOTAL: 1.4 mg/dL — AB (ref 0.3–1.2)
BUN: 13 mg/dL (ref 6–20)
CALCIUM: 9.2 mg/dL (ref 8.9–10.3)
CO2: 21 mmol/L — ABNORMAL LOW (ref 22–32)
Chloride: 107 mmol/L (ref 101–111)
Creatinine, Ser: 0.91 mg/dL (ref 0.44–1.00)
Glucose, Bld: 116 mg/dL — ABNORMAL HIGH (ref 65–99)
Potassium: 3.7 mmol/L (ref 3.5–5.1)
Sodium: 139 mmol/L (ref 135–145)
TOTAL PROTEIN: 7.4 g/dL (ref 6.5–8.1)

## 2017-02-26 LAB — CBC
HCT: 39.9 % (ref 36.0–46.0)
Hemoglobin: 13.3 g/dL (ref 12.0–15.0)
MCH: 26.6 pg (ref 26.0–34.0)
MCHC: 33.3 g/dL (ref 30.0–36.0)
MCV: 79.8 fL (ref 78.0–100.0)
Platelets: 169 10*3/uL (ref 150–400)
RBC: 5 MIL/uL (ref 3.87–5.11)
RDW: 15.3 % (ref 11.5–15.5)
WBC: 5.8 10*3/uL (ref 4.0–10.5)

## 2017-02-26 LAB — LIPASE, BLOOD: Lipase: 19 U/L (ref 11–51)

## 2017-02-26 MED ORDER — CEPHALEXIN 500 MG PO CAPS
500.0000 mg | ORAL_CAPSULE | Freq: Once | ORAL | Status: AC
  Administered 2017-02-26: 500 mg via ORAL
  Filled 2017-02-26: qty 1

## 2017-02-26 MED ORDER — CEPHALEXIN 500 MG PO CAPS: 500.0000 mg | ORAL_CAPSULE | Freq: Three times a day (TID) | ORAL | 0 refills | Status: DC

## 2017-02-26 NOTE — ED Provider Notes (Addendum)
[facility]-EMERGENCY DEPT Provider Note   CSN: [id number] Arrival date & time: 02/26/17  1805     History   Chief Complaint Chief Complaint  Patient presents with  . Fever  . Dysuria  . Emesis    HPI [name] is a 76 y.o. female.Right flank pain earlier today, shaking chills and temperature 101.1. She vomited one time since onset of illness. No other associated symptoms. She feels improved since treatment with Tylenol. She is presently asymptomatic and feels well. No other associated symptoms  HPI  Past Medical History:  Diagnosis Date  . Heart murmur   . Hyperlipidemia   . Hypertension   . Osteoporosis   . Seasonal allergies     Patient Active Problem List   Diagnosis Date Noted  . HYPERCHOLESTEROLEMIA 01/23/2007  . HYPERTENSION, BENIGN SYSTEMIC 01/23/2007  . 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 OSTEOPOROSIS, UNSPECIFIED 01/23/2007  . INCONTINENCE, URGE 01/23/2007    Past Surgical History:  Procedure Laterality Date  . FRACTURE SURGERY      OB History    No data available       Home Medications    Prior to Admission medications   Medication Sig Start Date End Date Taking? Authorizing Provider  aspirin 81 MG tablet Take 160 mg by mouth daily.    Historical Provider, MD  atorvastatin (LIPITOR) 20 MG tablet Take 40 mg by mouth daily.     Historical Provider, MD  calcium-vitamin D (OSCAL) 250-125 MG-UNIT per tablet Take 1 tablet by mouth daily.    Historical Provider, MD  celecoxib (CELEBREX) 100 MG capsule Take 1 capsule (100 mg total) by mouth 2 (two) times daily after a meal. 11/17/16   Dione Booze, MD  ibuprofen (ADVIL,MOTRIN) 200 MG tablet Take 200 mg by mouth every 6 (six) hours as needed for moderate pain.     Historical Provider, MD  irbesartan (AVAPRO) 150 MG tablet Take 150 mg by mouth at bedtime.    Historical Provider, MD  Multiple Vitamin (MULTIVITAMIN) tablet Take 1 tablet by mouth daily.    Historical Provider, MD  oxybutynin (DITROPAN-XL) 10 MG 24 hr  tablet Take 10 mg by mouth daily.     Historical Provider, MD    Family History Family History  Problem Relation Age of Onset  . Diabetes Mother   . CAD Father   . Diabetes Sister   . Diabetes Brother   . Diabetes Sister   . Diabetes Sister   . Cancer - Colon Brother   . Cancer Brother   . Alzheimer's disease Brother     Social History Social History  Substance Use Topics  . Smoking status: Never Smoker  . Smokeless tobacco: Never Used  . Alcohol use No     Allergies   Aspirin   Review of Systems Review of Systems  Constitutional: Positive for fever.  HENT: Negative.   Respiratory: Negative.   Cardiovascular: Negative.   Gastrointestinal: Positive for vomiting.  Genitourinary: Positive for flank pain.  Skin: Negative.   Neurological: Negative.   Psychiatric/Behavioral: Negative.   All other systems reviewed and are negative.    Physical Exam Updated Vital Signs BP (!) 103/55 (BP Location: Right Arm)   Pulse 76   Temp 98.8 F (37.1 C) (Oral)   Resp 14   Ht  (1.575 m)   Wt 147 lb (66.7 kg)   LMP  (LMP Unknown)   SpO2 100%   BMI 26.89 kg/m   Physical Exam  Constitutional: She appears well-developed  and well-nourished.  HENT:  Head: Normocephalic and atraumatic.  Eyes: Conjunctivae are normal. Pupils are equal, round, and reactive to light.  Neck: Neck supple. No tracheal deviation present. No thyromegaly present.  Cardiovascular: Normal rate and regular rhythm.   No murmur heard. Pulmonary/Chest: Effort normal and breath sounds normal.  Abdominal: Soft. Bowel sounds are normal. She exhibits no distension. There is no tenderness.  Genitourinary:  Genitourinary Comments: No flank tenderness  Musculoskeletal: Normal range of motion. She exhibits no edema or tenderness.  Neurological: She is alert. Coordination normal.  Skin: Skin is warm and dry. No rash noted.  Psychiatric: She has a normal mood and affect.  Nursing note and vitals  reviewed.    ED Treatments / Results  Labs (all labs ordered are listed, but only abnormal results are displayed) Labs Reviewed  COMPREHENSIVE METABOLIC PANEL - Abnormal; Notable for the following:       Result Value   CO2 21 (*)    Glucose, Bld 116 (*)    Total Bilirubin 1.4 (*)    All other components within normal limits  URINALYSIS, ROUTINE W REFLEX MICROSCOPIC - Abnormal; Notable for the following:    APPearance CLOUDY (*)    Hgb urine dipstick MODERATE (*)    Protein, ur 100 (*)    Nitrite POSITIVE (*)    Leukocytes, UA LARGE (*)    Bacteria, UA MANY (*)    Non Squamous Epithelial 0-5 (*)    All other components within normal limits  LIPASE, BLOOD  CBC   Results for orders placed or performed during the hospital encounter of 02/26/17  Lipase, blood  Result Value Ref Range   Lipase 19 11 - 51 U/L  Comprehensive metabolic panel  Result Value Ref Range   Sodium 139 135 - 145 mmol/L   Potassium 3.7 3.5 - 5.1 mmol/L   Chloride 107 101 - 111 mmol/L   CO2 21 (L) 22 - 32 mmol/L   Glucose, Bld 116 (H) 65 - 99 mg/dL   BUN 13 6 - 20 mg/dL   Creatinine, Ser 1.61 0.44 - 1.00 mg/dL   Calcium 9.2 8.9 - 09.6 mg/dL   Total Protein 7.4 6.5 - 8.1 g/dL   Albumin 3.8 3.5 - 5.0 g/dL   AST 28 15 - 41 U/L   ALT 22 14 - 54 U/L   Alkaline Phosphatase 102 38 - 126 U/L   Total Bilirubin 1.4 (H) 0.3 - 1.2 mg/dL   GFR calc non Af Amer >60 >60 mL/min   GFR calc Af Amer >60 >60 mL/min   Anion gap 11 5 - 15  CBC  Result Value Ref Range   WBC 5.8 4.0 - 10.5 K/uL   RBC 5.00 3.87 - 5.11 MIL/uL   Hemoglobin 13.3 12.0 - 15.0 g/dL   HCT 04.5 40.9 - 81.1 %   MCV 79.8 78.0 - 100.0 fL   MCH 26.6 26.0 - 34.0 pg   MCHC 33.3 30.0 - 36.0 g/dL   RDW 91.4 78.2 - 95.6 %   Platelets 169 150 - 400 K/uL  Urinalysis, Routine w reflex microscopic  Result Value Ref Range   Color, Urine YELLOW YELLOW   APPearance CLOUDY (A) CLEAR   Specific Gravity, Urine 1.014 1.005 - 1.030   pH 5.0 5.0 - 8.0    Glucose, UA NEGATIVE NEGATIVE mg/dL   Hgb urine dipstick MODERATE (A) NEGATIVE   Bilirubin Urine NEGATIVE NEGATIVE   Ketones, ur NEGATIVE NEGATIVE mg/dL   Protein, ur  100 (A) NEGATIVE mg/dL   Nitrite POSITIVE (A) NEGATIVE   Leukocytes, UA LARGE (A) NEGATIVE   RBC / HPF TOO NUMEROUS TO COUNT 0 - 5 RBC/hpf   WBC, UA TOO NUMEROUS TO COUNT 0 - 5 WBC/hpf   Bacteria, UA MANY (A) NONE SEEN   Squamous Epithelial / LPF NONE SEEN NONE SEEN   WBC Clumps PRESENT    Mucous PRESENT    Hyaline Casts, UA PRESENT    Non Squamous Epithelial 0-5 (A) NONE SEEN   No results found. EKG  EKG Interpretation None       Radiology No results found.  Procedures Procedures (including critical care time)  Medications Ordered in ED Medications - No data to display   Initial Impression / Assessment and Plan / ED Course  I have reviewed the triage vital signs and the nursing notes.  Pertinent labs & imaging results that were available during my care of the patient were reviewed by me and considered in my medical decision making (see chart for details).   8:55 PM patient continues to feel well. No nausea. Remains asymptomatic.  Patient agreeable to home course of antibiotics. Tylenol Plan prescription Keflex urine sent for culture. Follow-up Dr. Tiburcio Pea. As outpatient she is given return precautions for vomiting, pain or feels worse.    Final Clinical Impressions(s) / ED Diagnoses  Diagnosis acute pyelonephritis Final diagnoses:  None    New Prescriptions New Prescriptions   No medications on file     Doug Sou, MD 02/26/17 2107    Doug Sou, MD 02/26/17 2118

## 2017-02-26 NOTE — ED Triage Notes (Signed)
Per EMS. Pt from home. Began to have dysuria this am. Hx of chronic UTIs, so pt tried to increase water intake. Noticed fever of 101F at noon. Last dose antipyretic  acetominophen 30 minutes ago. Has also began to have R lower back pain and threw up upon arrival.

## 2017-02-26 NOTE — ED Notes (Signed)
Pt states she had uncontrollable shaking. She states she had pain in her back but she is not currently hurting. Pt states she is not currently nauseous. Pt states a hx of high BP, UTI's, and a heart murmur, and osteoporosis.

## 2017-02-26 NOTE — Discharge Instructions (Signed)
Make sure that you drink at least six 8 ounce glasses of water or Gatorade each day in order to stay well-hydrated. Take Tylenol 650 mg every 4 hours for aches or for temperature higher than 100.4 while awake. Take the antibiotic as prescribed. Call Dr. Tiburcio Pea tomorrow to arrange to be rechecked in his office within a week. Return if you have persistent vomiting, developed lightheadedness or faint feeling, or feel worse for any reason. Your urine has been sent for culture. We will call you if the antibiotic needs to be changed

## 2017-03-01 LAB — URINE CULTURE
Culture: >=100000 — AB
Special Requests: NORMAL

## 2017-03-02 ENCOUNTER — Telehealth: Payer: Self-pay

## 2017-03-02 NOTE — Telephone Encounter (Signed)
Post ED Visit - Positive Culture Follow-up  Culture report reviewed by antimicrobial stewardship pharmacist:   Enzo Bi, Pharm.D.  Celedonio Miyamoto, Pharm.D., BCPS AQ-ID  Garvin Fila, Pharm.D., BCPS  Georgina Pillion, [address].D., BCPS  [place], [address].D., BCPS, AAHIVP  Estella Husk, Pharm.D., BCPS, AAHIVP  Lysle Pearl, PharmD, BCPS  Casilda Carls, PharmD, BCPS  Pollyann Samples, PharmD, BCPS [facility] D Positive urine culture Treated with Cephalexin, organism sensitive to the same and no further patient follow-up is required at this time.  Jerry Caras 03/02/2017, 9:12 AM

## 2017-04-22 NOTE — Progress Notes (Signed)
Cardiology Office Note   Date:  04/23/2017   ID:  [name], DOB 1941/07/20, MRN [id number]  PCP:  Johny Blamer, MD  Cardiologist:   Donato Schultz, MD     History of Present Illness: [name] is a 76 y.o. female who presents for follow up. All of a sudden felt faint while standing in the kitchen after coming down the stairs to get some water. Few seconds out. Felt very sweaty, heart pounded. 3rd time she fainted. They have all occurred in a standing position. She has not been driving since. She lives with her daughter.  First episode happened when trying to rush to bathroom with incontinence.   After last visit, went to ER on 11/17/16. Woke up to urinate and felt racing heart and dull pain in left arm. At that time was wearing event monitor. No adverse rhythm.   Has a history of hyperlipidemia, hypertension, anxiety. Never smoked.  Overall she's been doing quite well without any further issues. She still has complaints of fatigue. No syncope at this point, no chest pain, no bleeding.  Past Medical History:  Diagnosis Date  . Heart murmur   . Hyperlipidemia   . Hypertension   . Osteoporosis   . Seasonal allergies     Past Surgical History:  Procedure Laterality Date  . FRACTURE SURGERY       Current Outpatient Prescriptions  Medication Sig Dispense Refill  . aspirin 81 MG tablet Take 162 mg by mouth daily.     Marland Kitchen atorvastatin (LIPITOR) 20 MG tablet Take 40 mg by mouth daily.     . calcium-vitamin D (OSCAL) 250-125 MG-UNIT per tablet Take 1 tablet by mouth daily.    . celecoxib (CELEBREX) 100 MG capsule Take 1 capsule (100 mg total) by mouth 2 (two) times daily after a meal. 30 capsule 0  . ibuprofen (ADVIL,MOTRIN) 200 MG tablet Take 200 mg by mouth every 6 (six) hours as needed for moderate pain.     Marland Kitchen irbesartan (AVAPRO) 150 MG tablet Take 150 mg by mouth at bedtime.    . Multiple Vitamin (MULTIVITAMIN) tablet Take 1 tablet by mouth daily.    Marland Kitchen oxybutynin  (DITROPAN-XL) 10 MG 24 hr tablet Take 10 mg by mouth daily.      No current facility-administered medications for this visit.     Allergies:   Aspirin high doses stomach irritated.    Social History:  The patient  reports that she has never smoked. She has never used smokeless tobacco. She reports that she does not drink alcohol or use drugs.   Family History:  The patient's family history includes Alzheimer's disease in her brother; CAD in her father; Cancer in her brother; Cancer - Colon in her brother; Diabetes in her brother, mother, sister, sister, and sister.    ROS:  Please see the history of present illness.   Otherwise, review of systems are positive for none.   All other systems are reviewed and negative.    PHYSICAL EXAM: VS:  BP 120/68   Pulse 64   Ht 5\' 2"  (1.575 m)   Wt 151 lb 9.6 oz (68.8 kg)   LMP  (LMP Unknown)   BMI 27.73 kg/m  , BMI Body mass index is 27.73 kg/m. GEN: Well nourished, well developed, in no acute distress  HEENT: normal  Neck: no JVD, mild radiation of heart murmur to carotids, no masses Cardiac: Bradycardic regular; 2/6 systolic murmur, no rubs, or gallops,no edema  Respiratory:  clear to auscultation bilaterally, normal work of breathing GI: soft, nontender, nondistended, + BS MS: no deformity or atrophy  Skin: warm and dry, no rash Neuro:  Strength and sensation are intact Psych: euthymic mood, full affect   EKG:  EKG ordered today shows 10/15/16-sinus rhythm, 73, possible old inferior infarct pattern. No significant change-49 bpm, STTW changes .   Recent Labs: 02/26/2017: ALT 22; BUN 13; Creatinine, Ser 0.91; Hemoglobin 13.3; Platelets 169; Potassium 3.7; Sodium 139    Lipid Panel    Component Value Date/Time   CHOL  01/12/2008 0450    172        ATP III CLASSIFICATION:  <200     mg/dL   Desirable  161-096  mg/dL   Borderline High  >=045    mg/dL   High   TRIG 409 81/19/1478 0450   HDL 41 01/12/2008 0450   CHOLHDL 4.2  01/12/2008 0450   VLDL 20 01/12/2008 0450   LDLCALC (H) 01/12/2008 0450    111        Total Cholesterol/HDL:CHD Risk Coronary Heart Disease Risk Table                     Men   Women  1/2 Average Risk   3.4   3.3      Wt Readings from Last 3 Encounters:  04/23/17 151 lb 9.6 oz (68.8 kg)  02/26/17 147 lb (66.7 kg)  11/17/16 147 lb (66.7 kg)      Other studies Reviewed: Additional studies/ records that were reviewed today include: Prior emergency room blood work, EKG, evaluation. Review of the above records demonstrates: As above  Nuclear stress test 03/08/15-low risk, no ischemia  Event monitor 10/24/16:  No adverse arrhythmias  No atrial fibrillation, no pauses  Rare PAC's  Mild bradycardia noted during sleep (within normal)  ECHO 10/16/16: - Left ventricle: The cavity size was normal. There was mild focal   basal hypertrophy of the septum. Systolic function was vigorous.   The estimated ejection fraction was in the range of 65% to 70%.   Wall motion was normal; there were no regional wall motion   abnormalities. Doppler parameters are consistent with abnormal   left ventricular relaxation (grade 1 diastolic dysfunction).   Indeterminate mean left atrial filling pressure. - Aortic valve: There was trivial regurgitation. - Pulmonary arteries: Systolic pressure was mildly increased. PA   peak pressure: 35 mm Hg (S).  ASSESSMENT AND PLAN:  Syncope  - Previous episode when coming down the stairs. Fell straight backwards. Event monitor reassuring. No further. No signs of pulses on monitor. This may have been a transient drop in blood pressure. Possible vagal reaction.   Heart murmur-this is been worked up in the past. She does have hyperdynamic contraction. Aortic valve sclerosis was noted. More recent ECHO confirms this. This is likely contribute into her heart murmur as well as I do hear radiation to the carotids as well. No evidence of outflow tract obstruction by  gradient.  Essential hypertension-medications reviewed. Elevated currently but we have to be careful not to over treat given her episodes of syncope. If her blood pressure gets too low, I would consider decreasing her Avapro.  Fatigue   - blood work reviewed from Dr. Tiburcio Pea, all reassuring including TSH, hemoglobin. This is been a long-standing issue. I recommended getting back to the [facility], exercise, motion.   Current medicines are reviewed at length with the patient today.  The patient does  not have concerns regarding medicines.  We will see her back on as-needed basis.  Signed, Donato SchultzMark Joby Hershkowitz, MD  04/23/2017 9:26 AM    [facility] [address], [place], [place]  [address] Phone: [phone]; Fax: [phone]

## 2017-04-23 ENCOUNTER — Ambulatory Visit (INDEPENDENT_AMBULATORY_CARE_PROVIDER_SITE_OTHER): Payer: Medicare Other | Admitting: Cardiology

## 2017-04-23 ENCOUNTER — Encounter: Payer: Self-pay | Admitting: Cardiology

## 2017-04-23 VITALS — BP 120/68 | HR 64 | Ht 62.0 in | Wt 151.6 lb

## 2017-04-23 DIAGNOSIS — R011 Cardiac murmur, unspecified: Secondary | ICD-10-CM | POA: Diagnosis not present

## 2017-04-23 DIAGNOSIS — I10 Essential (primary) hypertension: Secondary | ICD-10-CM

## 2017-04-23 DIAGNOSIS — R55 Syncope and collapse: Secondary | ICD-10-CM | POA: Diagnosis not present

## 2017-04-23 NOTE — Patient Instructions (Signed)
Medication Instructions:  Your physician recommends that you continue on your current medications as directed. Please refer to the Current Medication list given to you today.   Labwork: none  Testing/Procedures: none  Follow-Up: Your physician recommends that you schedule a follow-up appointment as needed with Dr. Skains    Any Other Special Instructions Will Be Listed Below (If Applicable).     If you need a refill on your cardiac medications before your next appointment, please call your pharmacy.   

## 2017-07-30 ENCOUNTER — Emergency Department (HOSPITAL_COMMUNITY)
Admission: EM | Admit: 2017-07-30 | Discharge: 2017-07-30 | Disposition: A | Discharge status: Home / Self Care | Payer: Medicare Other | Attending: Emergency Medicine | Admitting: Emergency Medicine

## 2017-07-30 ENCOUNTER — Encounter (HOSPITAL_COMMUNITY): Payer: Self-pay

## 2017-07-30 DIAGNOSIS — R300 Dysuria: Secondary | ICD-10-CM | POA: Diagnosis present

## 2017-07-30 DIAGNOSIS — Z5321 Procedure and treatment not carried out due to patient leaving prior to being seen by health care provider: Secondary | ICD-10-CM | POA: Diagnosis not present

## 2017-07-30 LAB — URINALYSIS, ROUTINE W REFLEX MICROSCOPIC
BILIRUBIN URINE: NEGATIVE
Glucose, UA: >=500 mg/dL — AB
Ketones, ur: NEGATIVE mg/dL
NITRITE: NEGATIVE
PH: 5 (ref 5.0–8.0)
Protein, ur: 100 mg/dL — AB
SPECIFIC GRAVITY, URINE: 1.014 (ref 1.005–1.030)
SQUAMOUS EPITHELIAL / LPF: NONE SEEN

## 2017-07-30 MED ORDER — ACETAMINOPHEN 325 MG PO TABS
650.0000 mg | ORAL_TABLET | Freq: Once | ORAL | Status: AC | PRN
  Administered 2017-07-30: 650 mg via ORAL
  Filled 2017-07-30: qty 2

## 2017-07-30 NOTE — ED Triage Notes (Signed)
Patient c/o dysuria, urinary frequency since last night.

## 2017-07-30 NOTE — ED Notes (Signed)
Per Dr Anitra LauthPlunkett, informed patient she needs to seek treatment for possible UTI-patient states she has appointment at PCP for tomorrow-patient stated she had to leave ED due to sone needing to go to work

## 2017-07-31 ENCOUNTER — Emergency Department (HOSPITAL_COMMUNITY)
Admission: EM | Admit: 2017-07-31 | Discharge: 2017-07-31 | Disposition: A | Discharge status: Home / Self Care | Payer: Medicare Other | Attending: Emergency Medicine | Admitting: Emergency Medicine

## 2017-07-31 ENCOUNTER — Encounter (HOSPITAL_COMMUNITY): Payer: Self-pay | Admitting: Emergency Medicine

## 2017-07-31 DIAGNOSIS — N3001 Acute cystitis with hematuria: Secondary | ICD-10-CM | POA: Diagnosis not present

## 2017-07-31 DIAGNOSIS — Z7982 Long term (current) use of aspirin: Secondary | ICD-10-CM | POA: Insufficient documentation

## 2017-07-31 DIAGNOSIS — Z79899 Other long term (current) drug therapy: Secondary | ICD-10-CM | POA: Diagnosis not present

## 2017-07-31 DIAGNOSIS — R300 Dysuria: Secondary | ICD-10-CM | POA: Diagnosis present

## 2017-07-31 DIAGNOSIS — R509 Fever, unspecified: Secondary | ICD-10-CM | POA: Diagnosis not present

## 2017-07-31 DIAGNOSIS — I10 Essential (primary) hypertension: Secondary | ICD-10-CM | POA: Diagnosis not present

## 2017-07-31 LAB — CBC WITH DIFFERENTIAL/PLATELET
BASOS PCT: 0 %
Basophils Absolute: 0 10*3/uL (ref 0.0–0.1)
Eosinophils Absolute: 0 10*3/uL (ref 0.0–0.7)
Eosinophils Relative: 0 %
HEMATOCRIT: 34.6 % — AB (ref 36.0–46.0)
Hemoglobin: 11.3 g/dL — ABNORMAL LOW (ref 12.0–15.0)
Lymphocytes Relative: 3 %
Lymphs Abs: 0.5 10*3/uL — ABNORMAL LOW (ref 0.7–4.0)
MCH: 26.8 pg (ref 26.0–34.0)
MCHC: 32.7 g/dL (ref 30.0–36.0)
MCV: 82.2 fL (ref 78.0–100.0)
MONO ABS: 0.9 10*3/uL (ref 0.1–1.0)
MONOS PCT: 6 %
Neutro Abs: 13.4 10*3/uL — ABNORMAL HIGH (ref 1.7–7.7)
Neutrophils Relative %: 91 %
PLATELETS: 149 10*3/uL — AB (ref 150–400)
RBC: 4.21 MIL/uL (ref 3.87–5.11)
RDW: 15.2 % (ref 11.5–15.5)
WBC: 14.7 10*3/uL — ABNORMAL HIGH (ref 4.0–10.5)

## 2017-07-31 LAB — BASIC METABOLIC PANEL
Anion gap: 9 (ref 5–15)
BUN: 13 mg/dL (ref 6–20)
CALCIUM: 9 mg/dL (ref 8.9–10.3)
CO2: 23 mmol/L (ref 22–32)
CREATININE: 0.97 mg/dL (ref 0.44–1.00)
Chloride: 106 mmol/L (ref 101–111)
GFR calc Af Amer: >60 mL/min (ref >60)
GFR, EST NON AFRICAN AMERICAN: 55 mL/min — AB (ref >60)
GLUCOSE: 112 mg/dL — AB (ref 65–99)
Potassium: 3.1 mmol/L — ABNORMAL LOW (ref 3.5–5.1)
Sodium: 138 mmol/L (ref 135–145)

## 2017-07-31 MED ORDER — CEPHALEXIN 500 MG PO CAPS
500.0000 mg | ORAL_CAPSULE | Freq: Once | ORAL | Status: AC
  Administered 2017-07-31: 500 mg via ORAL
  Filled 2017-07-31: qty 1

## 2017-07-31 MED ORDER — CEPHALEXIN 500 MG PO CAPS: 500.0000 mg | ORAL_CAPSULE | Freq: Three times a day (TID) | ORAL | 0 refills | Status: DC

## 2017-07-31 NOTE — ED Notes (Signed)
Bed: WA20 Expected date:  Expected time:  Means of arrival:  Comments: 76 yr old UTI

## 2017-07-31 NOTE — ED Triage Notes (Signed)
Pt from home with UTI and fever complaint. Pt states odor and cloudiness to urine x1 week. Per EMS, hot to touch. Patient has had generzalized weakness and polyuria today. [facility] but left due to her son having to leave for work. Urine was collected at that time.

## 2017-07-31 NOTE — ED Provider Notes (Signed)
[facility]-EMERGENCY DEPT Provider Note   CSN: [id number] Arrival date & time: 07/31/17  0028     History   Chief Complaint Chief Complaint  Patient presents with  . UTI symptoms  . Fever    HPI [name] is a 76 y.o. female.  The history is provided by the patient and a relative.  Dysuria   This is a new problem. The current episode started 2 days ago. The problem occurs every urination. The problem has been gradually worsening. The quality of the pain is described as burning. The pain is moderate. The maximum temperature recorded prior to her arrival was 100 to 100.9 F. Associated symptoms include chills, nausea, vomiting, frequency and flank pain.  Patient presents with dysuria and urinary frequency over past 2 days She has had nausea/vomiting She had fever up to 100.7 She went to the ED on 9/4 for this, waited too long and went home She returns to ED for evaluation This is similar to prior episodes of UTI   Past Medical History:  Diagnosis Date  . Heart murmur   . Hyperlipidemia   . Hypertension   . Osteoporosis   . Seasonal allergies     Patient Active Problem List   Diagnosis Date Noted  . HYPERCHOLESTEROLEMIA 01/23/2007  . HYPERTENSION, BENIGN SYSTEMIC 01/23/2007  . 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 OSTEOPOROSIS, UNSPECIFIED 01/23/2007  . INCONTINENCE, URGE 01/23/2007    Past Surgical History:  Procedure Laterality Date  . FRACTURE SURGERY      OB History    No data available       Home Medications    Prior to Admission medications   Medication Sig Start Date End Date Taking? Authorizing Provider  acetaminophen (TYLENOL) 500 MG tablet Take 1,000 mg by mouth every 6 (six) hours as needed for fever.   Yes [provider]  amLODipine (NORVASC) 10 MG tablet Take 10 mg by mouth daily.   Yes [provider]  aspirin 81 MG tablet Take 162 mg by mouth daily.    Yes [provider]  atorvastatin (LIPITOR) 20 MG tablet Take 40 mg by  mouth daily.    Yes [provider]  calcium-vitamin D (OSCAL) 250-125 MG-UNIT per tablet Take 1 tablet by mouth daily.   Yes [provider]  Chlorphen-Pseudoephed-APAP (CORICIDIN D PO) Take 1 tablet by mouth daily as needed (allergies).   Yes [provider]  ibuprofen (ADVIL,MOTRIN) 200 MG tablet Take 200 mg by mouth every 6 (six) hours as needed for moderate pain.    Yes [provider]  irbesartan (AVAPRO) 150 MG tablet Take 150 mg by mouth at bedtime.   Yes [provider]  mirabegron ER (MYRBETRIQ) 25 MG TB24 tablet Take 25 mg by mouth daily.   Yes [provider]  Multiple Vitamin (MULTIVITAMIN) tablet Take 1 tablet by mouth daily.   Yes [provider]  cephALEXin (KEFLEX) 500 MG capsule Take 1 capsule (500 mg total) by mouth 3 (three) times daily. 07/31/17   Zadie RhineWickline, Jovanie Verge, MD    Family History Family History  Problem Relation Age of Onset  . Diabetes Mother   . CAD Father   . Diabetes Sister   . Diabetes Brother   . Diabetes Sister   . Diabetes Sister   . Cancer - Colon Brother   . Cancer Brother   . Alzheimer's disease Brother     Social History Social History  Substance Use Topics  . Smoking status: Never Smoker  .  Smokeless tobacco: Never Used  . Alcohol use No     Allergies   Aspirin   Review of Systems Review of Systems  Constitutional: Positive for chills and fever.  Gastrointestinal: Positive for nausea and vomiting.  Genitourinary: Positive for dysuria, flank pain and frequency.  All other systems reviewed and are negative.    Physical Exam Updated Vital Signs BP (!) 105/55   Pulse 66   Temp 98 F (36.7 C) (Oral)   Resp 15   LMP  (LMP Unknown)   SpO2 97%   Physical Exam CONSTITUTIONAL: Elderly, no acute distress HEAD: Normocephalic/atraumatic EYES: EOMI/PERRL ENMT: Mucous membranes moist NECK: supple no meningeal signs SPINE/BACK:entire spine nontender CV: S1/S2 noted, no  murmurs/rubs/gallops noted LUNGS: Lungs are clear to auscultation bilaterally, no apparent distress ABDOMEN: soft, nontender, no rebound or guarding, bowel sounds noted throughout abdomen GU:no cva tenderness NEURO: Pt is awake/alert/appropriate, moves all extremitiesx4.   EXTREMITIES: pulses normal/equal, full ROM SKIN: warm, color normal PSYCH: no abnormalities of mood noted, alert and oriented to situation   ED Treatments / Results  Labs (all labs ordered are listed, but only abnormal results are displayed) Labs Reviewed  BASIC METABOLIC PANEL - Abnormal; Notable for the following:       Result Value   Potassium 3.1 (*)    Glucose, Bld 112 (*)    GFR calc non Af Amer 55 (*)    All other components within normal limits  CBC WITH DIFFERENTIAL/PLATELET - Abnormal; Notable for the following:    WBC 14.7 (*)    Hemoglobin 11.3 (*)    HCT 34.6 (*)    Platelets 149 (*)    Neutro Abs 13.4 (*)    Lymphs Abs 0.5 (*)    All other components within normal limits  URINE CULTURE    EKG  EKG Interpretation None       Radiology No results found.  Procedures Procedures (including critical care time)  Medications Ordered in ED Medications  cephALEXin (KEFLEX) capsule 500 mg (500 mg Oral Given 07/31/17 0414)     Initial Impression / Assessment and Plan / ED Course  I have reviewed the triage vital signs and the nursing notes.  Pertinent labs   results that were available during my care of the patient were reviewed by me and considered in my medical decision making (see chart for details).     Pt noted to have UTI on previous ED visit on 9/4 that was not treated Will start keflex Pt feels much improved She is not vomiting She is not septic appearing  Will d/c home Urine culture ordered Discussed strict ER return precautions   Final Clinical Impressions(s) / ED Diagnoses   Final diagnoses:  Acute cystitis with hematuria    New Prescriptions Discharge Medication  List as of 07/31/2017  4:06 AM    START taking these medications   Details  cephALEXin (KEFLEX) 500 MG capsule Take 1 capsule (500 mg total) by mouth 3 (three) times daily., Starting Wed 07/31/2017, Print         Zadie Rhine, MD 07/31/17 [phone]

## 2017-08-02 LAB — URINE CULTURE: Culture: >=100000 — AB

## 2017-08-03 ENCOUNTER — Telehealth: Payer: Self-pay | Admitting: Emergency Medicine

## 2017-08-03 NOTE — Telephone Encounter (Signed)
Post ED Visit - Positive Culture Follow-up  Culture report reviewed by antimicrobial stewardship pharmacist:  []  Enzo BiNathan Batchelder, Pharm.D. []  Celedonio MiyamotoJeremy Frens, Pharm.D., BCPS AQ-ID []  Garvin FilaMike Maccia, Pharm.D., BCPS []  Georgina PillionElizabeth Martin, Pharm.D., BCPS []  MistonMinh Pham, [address]Pharm.D., BCPS, AAHIVP []  Estella HuskMichelle Turner, Pharm.D., BCPS, AAHIVP [x]  Lysle Pearlachel Rumbarger, PharmD, BCPS []  Casilda Carlsaylor Stone, PharmD, BCPS []  Pollyann SamplesAndy Johnston, PharmD, BCPS  Positive urine culture Treated with cephalexin, organism sensitive to the same and no further patient follow-up is required at this time.  Berle MullMiller, Quindell Shere 08/03/2017, 11:52 AM

## 2019-08-21 ENCOUNTER — Other Ambulatory Visit: Payer: Self-pay | Admitting: Family Medicine

## 2019-08-21 DIAGNOSIS — R413 Other amnesia: Secondary | ICD-10-CM

## 2019-09-05 ENCOUNTER — Ambulatory Visit
Admission: RE | Admit: 2019-09-05 | Discharge: 2019-09-05 | Disposition: A | Discharge status: Home / Self Care | Payer: Medicare Other | Source: Ambulatory Visit | Attending: Family Medicine | Admitting: Family Medicine

## 2019-09-05 ENCOUNTER — Other Ambulatory Visit: Payer: Self-pay

## 2019-09-05 DIAGNOSIS — R413 Other amnesia: Secondary | ICD-10-CM

## 2019-09-30 ENCOUNTER — Ambulatory Visit: Payer: Medicare Other | Admitting: Neurology

## 2019-09-30 ENCOUNTER — Encounter: Payer: Self-pay | Admitting: Neurology

## 2019-09-30 ENCOUNTER — Other Ambulatory Visit: Payer: Self-pay

## 2019-09-30 VITALS — BP 155/71 | HR 56 | Temp 97.9°F | Ht 62.0 in | Wt 145.0 lb

## 2019-09-30 DIAGNOSIS — R413 Other amnesia: Secondary | ICD-10-CM | POA: Diagnosis not present

## 2019-09-30 NOTE — Progress Notes (Signed)
Subjective:    Patient ID: [name] is a 78 y.o. female.  HPI     [name], MD, PhD [facility] [address], [address] [address] [place], [place] [address]  Dear Dr. Tiburcio Pea, I saw your patient, [name], upon your kind request in my neurologic clinic today for initial consultation of her memory loss.  The patient is accompanied by her son today. As you know, [name] is a 63 year old right-handed woman with an underlying medical history of hypertension, hyperlipidemia, history of heart murmur, osteoporosis, seasonal allergies, anxiety, tinnitus, history of thrombophlebitis, and coronary artery disease, who reports short-term memory loss for the past 1 year.  She reports forgetfulness and difficulty processing numbers and remembering names.  Her son has noticed issues in the past 2 months.  He reports that prior to 2 months ago she was quite independent, and he did not have to get too much involved in her care, she has been living with him for the past 5 years or so.  She recently had an instance of getting confused while driving and got lost.  She has since then not driven in the past 2 or 3 months.  I reviewed your office note from 08/12/2019.  She has tried donepezil but could not tolerate it. She is now on memantine 5 mg bid.  She reports a family history of dementia affecting her brother and sister.  Her older brother is 2 years older and has significant memory loss, her oldest sister is about 63 years old and has memory loss.  She had a total of 6 siblings, 2 siblings passed away.  She does not believe either parents had dementia or her grandparents.  Her anxiety level has increased in the past couple of months.  Her son reports that side effects from the Aricept included nausea.  Currently, she tolerates the Namenda 5 mg twice daily. She had blood work in your office on 08/16/2019 and I reviewed the results: Cholesterol was 225, triglycerides 94, LDL 142, RPR  was nonreactive, TSH 2.09, CMP showed creatinine of 0.85, BUN of 12, CBC with differential showed fairly normal findings, B12 was 433, you also ordered a brain MRI.  She had a brain MRI without contrast on 09/06/2019 and I reviewed the results: IMPRESSION: Normal aging brain. She is widowed, lives with her son [name] and daughter-in-law.  [name] is the second youngest of 5 children.  She has a daughter in [place], a son in [place], a daughter in [place] and youngest son lives in [place].  She retired from [employer] as a second language teacher.  Her MMSE in your office in September was 25, she had a normal clock drawing result at the time.  She is a non-smoker and does not drink alcohol and drinks no caffeine on a day-to-day basis.  Her Past Medical History Is Significant For: Past Medical History:  Diagnosis Date  . Heart murmur   . Hyperlipidemia   . Hypertension   . Osteoporosis   . Seasonal allergies     Her Past Surgical History Is Significant For: Past Surgical History:  Procedure Laterality Date  . CATARACT EXTRACTION    . FRACTURE SURGERY    . ORIF ANKLE FRACTURE    . TUBAL LIGATION    . WRIST SURGERY      Her Family History Is Significant For: Family History  Problem Relation Age of Onset  . Diabetes Mother   . CAD Father   . Diabetes Sister   .  Diabetes Brother   . Diabetes Sister   . Diabetes Sister   . Cancer - Colon Brother   . Cancer Brother   . Alzheimer's disease Brother     Her Social History Is Significant For: Social History   Socioeconomic History  . Marital status: Widowed    Spouse name: Not on file  . Number of children: Not on file  . Years of education: Not on file  . Highest education level: Not on file  Occupational History  . Not on file  Social Needs  . Financial resource strain: Not on file  . Food insecurity    Worry: Not on file    Inability: Not on file  . Transportation needs    Medical: Not on file    Non-medical: Not on file  Tobacco Use  .  Smoking status: Never Smoker  . Smokeless tobacco: Never Used  Substance and Sexual Activity  . Alcohol use: No  . Drug use: No  . Sexual activity: Not on file  Lifestyle  . Physical activity    Days per week: Not on file    Minutes per session: Not on file  . Stress: Not on file  Relationships  . Social Herbalist on phone: Not on file    Gets together: Not on file    Attends religious service: Not on file    Active member of club or organization: Not on file    Attends meetings of clubs or organizations: Not on file    Relationship status: Not on file  Other Topics Concern  . Not on file  Social History Narrative  . Not on file    Her Allergies Are:  Allergies  Allergen Reactions  . Aspirin Nausea And Vomiting    High doses  :   Her Current Medications Are:  Outpatient Encounter Medications as of 09/30/2019  Medication Sig  . acetaminophen (TYLENOL) 500 MG tablet Take 1,000 mg by mouth every 6 (six) hours as needed for fever.  Marland Kitchen amLODipine (NORVASC) 10 MG tablet Take 10 mg by mouth daily.  Marland Kitchen aspirin 81 MG tablet Take 162 mg by mouth daily.   Marland Kitchen atorvastatin (LIPITOR) 40 MG tablet Take 40 mg by mouth daily.  . calcium-vitamin D (OSCAL) 250-125 MG-UNIT per tablet Take 1 tablet by mouth daily.  . Chlorphen-Pseudoephed-APAP (CORICIDIN D PO) Take 1 tablet by mouth daily as needed (allergies).  Marland Kitchen ibuprofen (ADVIL,MOTRIN) 200 MG tablet Take 200 mg by mouth every 6 (six) hours as needed for moderate pain.   Marland Kitchen irbesartan (AVAPRO) 150 MG tablet Take 150 mg by mouth at bedtime.  . memantine (NAMENDA) 5 MG tablet Take 5 mg by mouth 2 (two) times daily.  . Multiple Vitamin (MULTIVITAMIN) tablet Take 1 tablet by mouth daily.  . solifenacin (VESICARE) 5 MG tablet Take 5 mg by mouth daily.  . [DISCONTINUED] atorvastatin (LIPITOR) 20 MG tablet Take 40 mg by mouth daily.   . [DISCONTINUED] cephALEXin (KEFLEX) 500 MG capsule Take 1 capsule (500 mg total) by mouth 3 (three) times  daily.  . [DISCONTINUED] mirabegron ER (MYRBETRIQ) 25 MG TB24 tablet Take 25 mg by mouth daily.   No facility-administered encounter medications on file as of 09/30/2019.   :   Review of Systems:  Out of a complete 14 point review of systems, all are reviewed and negative with the exception of these symptoms as listed below:  Review of Systems  Neurological:  Pt presents today to discuss her memory. Pt's memory has been worsening but seems to be better on memantine 5mg .    Objective:  Neurological Exam  Physical Exam Physical Examination:   Vitals:   09/30/19 1415  BP: (!) 155/71  Pulse: (!) 56  Temp: 97.9 F (36.6 C)    General Examination: The patient is a very pleasant 78 y.o. female in no acute distress. She appears well-developed and well-nourished and well groomed.   HEENT: Normocephalic, atraumatic, pupils are equal, round and reactive to light,Extraocular tracking is well preserved, hearing is grossly intact.  She wears corrective eyeglasses.  She has not had an eye examination in over 2 years.  Airway examination reveals reasonably benign findings, tongue protrudes centrally in palate elevates symmetrically, mild mouth dryness is noted, no carotid bruits.  Speech is clear without dysarthria, hypophonia or voice tremor.   Chest: Clear to auscultation without wheezing, rhonchi or crackles noted.  Heart: S1+S2+0, regular and normal without murmurs, rubs or gallops noted.   Abdomen: Soft, non-tender and non-distended with normal bowel sounds appreciated on auscultation.  Extremities: There is no pitting edema in the distal lower extremities bilaterally. Pedal pulses are intact.  Skin: Warm and dry without trophic changes noted.  Musculoskeletal: exam reveals no obvious joint deformities, tenderness or joint swelling or erythema, Some ankle discomfort on the right side, she had surgery to the ankle, she also has some discomfort in the right knee.   Neurologically:   Mental status: The patient is awake, alert and oriented in all 4 spheres. Her immediate and remote memory, attention, language skills and fund of knowledge are Mildly impaired. There is no evidence of aphasia, agnosia, apraxia or anomia. Speech is clear with normal prosody and enunciation. Thought process is linear. Mood is normal and affect is normal.  Cranial nerves II - XII are as described above under HEENT exam. In addition: shoulder shrug is normal with equal shoulder height noted.  On 09/30/2019: MMSE: 21/30, CDT: 1/4, AFT: 13/min.  Motor exam: Normal bulk, strength and tone is noted. There is no drift, tremor or rebound. Romberg is negative. Reflexes are 2+ throughout. Babinski: Toes are flexor bilaterally. Fine motor skills and coordination: intact with normal finger taps, normal hand movements, normal rapid alternating patting, normal foot taps and normal foot agility.  Cerebellar testing: No dysmetria or intention tremor on finger to nose testing. Heel to shin is unremarkable bilaterally. There is no truncal or gait ataxia.  Sensory exam: intact to light touch. Gait, station and balance: She stands easily. No veering to one side is noted. No leaning to one side is noted. Posture is age-appropriate and stance is narrow based. Gait shows normal stride length and normal pace. No problems turning are noted.  Assessment and Plan:  Assessment and Plan:  In summary, Laurelyn SickleBrenda S Ziebarth is a very pleasant 78 y.o.-year old female with an underlying medical history of hypertension, hyperlipidemia, history of heart murmur, osteoporosis, seasonal allergies, anxiety, tinnitus, history of thrombophlebitis, and coronary artery disease, who Presents for evaluation of her memory loss.  She reports a brother and a sister with history of memory loss/dementia.  Her brain MRI was quite benign recently.  She had recent appropriate blood work through your office.  She does have an abnormal MMSE today but it was much  better less than 2 months ago in your office.  She has been able to tolerate generic Namenda 5 mg twice daily and I recommended that she continue with it.  We talked about the importance of healthy lifestyle, physical activity, staying well-hydrated and well rested.We could consider a more in-depth and formal evaluation with a neuropsychologist for cognitive testing.  We can certainly consider this at the next visit.  She has an appointment next month with you for follow-up.  You could consider increasing the Namenda at the time to 10 mg twice daily.  I suggested follow-up with one of our nurse practitioners in 6 months, sooner if needed.  She has some vascular risk factors.  Family history of dementia would be more supportive of Alzheimer's dementia.  We will continue to follow her clinically. She does endorse some residual anxiety.  If she has significant anxiety and/or depression, can also affect her memory function, I explained to them.  She is advised to follow-up with you in that regard as well. She is advised not to drive.  I answered all their questions today and the patient and her son were in agreement. Thank you very much for allowing me to participate in the care of this nice patient. If I can be of any further assistance to you please do not hesitate to call me at [phone].  Sincerely,   [name], MD, PhD

## 2019-09-30 NOTE — Patient Instructions (Signed)
I would recommend you Continue with your memory medication, Dr. Kenton Kingfisher may consider increasing your Namenda to 10 mg twice daily at the next visit with him next month.  You have had a brain MRI and recent lab testing.  We can certainly consider a more formal memory evaluation with the help of a neuropsychologist, we can consider this in the near future. If you have any residual anxiety or depressive symptoms, please talk to Dr. Kenton Kingfisher about it at your next appointment.  Please follow-up with [place] in 6 months, you can see one of our nurse practitioners. Please also make an Appointment for your eye exam as it may be overdue.

## 2020-03-30 ENCOUNTER — Ambulatory Visit: Payer: Medicare Other | Admitting: Neurology

## 2020-03-30 ENCOUNTER — Telehealth: Payer: Self-pay

## 2020-03-30 NOTE — Telephone Encounter (Signed)
Pt no showed 03/30/2020 f/u appt with Dr. Frances Furbish.

## 2021-03-10 DIAGNOSIS — I2510 Atherosclerotic heart disease of native coronary artery without angina pectoris: Secondary | ICD-10-CM | POA: Diagnosis not present

## 2021-03-10 DIAGNOSIS — I10 Essential (primary) hypertension: Secondary | ICD-10-CM | POA: Diagnosis not present

## 2021-03-10 DIAGNOSIS — M810 Age-related osteoporosis without current pathological fracture: Secondary | ICD-10-CM | POA: Diagnosis not present

## 2021-03-10 DIAGNOSIS — E7800 Pure hypercholesterolemia, unspecified: Secondary | ICD-10-CM | POA: Diagnosis not present

## 2021-04-10 DIAGNOSIS — M810 Age-related osteoporosis without current pathological fracture: Secondary | ICD-10-CM | POA: Diagnosis not present

## 2021-04-10 DIAGNOSIS — I2510 Atherosclerotic heart disease of native coronary artery without angina pectoris: Secondary | ICD-10-CM | POA: Diagnosis not present

## 2021-04-10 DIAGNOSIS — E7800 Pure hypercholesterolemia, unspecified: Secondary | ICD-10-CM | POA: Diagnosis not present

## 2021-04-10 DIAGNOSIS — I10 Essential (primary) hypertension: Secondary | ICD-10-CM | POA: Diagnosis not present

## 2021-05-12 DIAGNOSIS — I10 Essential (primary) hypertension: Secondary | ICD-10-CM | POA: Diagnosis not present

## 2021-05-12 DIAGNOSIS — M810 Age-related osteoporosis without current pathological fracture: Secondary | ICD-10-CM | POA: Diagnosis not present

## 2021-05-12 DIAGNOSIS — I2510 Atherosclerotic heart disease of native coronary artery without angina pectoris: Secondary | ICD-10-CM | POA: Diagnosis not present

## 2021-05-12 DIAGNOSIS — E7800 Pure hypercholesterolemia, unspecified: Secondary | ICD-10-CM | POA: Diagnosis not present

## 2021-06-23 DIAGNOSIS — I2510 Atherosclerotic heart disease of native coronary artery without angina pectoris: Secondary | ICD-10-CM | POA: Diagnosis not present

## 2021-06-23 DIAGNOSIS — I10 Essential (primary) hypertension: Secondary | ICD-10-CM | POA: Diagnosis not present

## 2021-06-23 DIAGNOSIS — M810 Age-related osteoporosis without current pathological fracture: Secondary | ICD-10-CM | POA: Diagnosis not present

## 2021-06-23 DIAGNOSIS — E7800 Pure hypercholesterolemia, unspecified: Secondary | ICD-10-CM | POA: Diagnosis not present

## 2021-07-10 DIAGNOSIS — I10 Essential (primary) hypertension: Secondary | ICD-10-CM | POA: Diagnosis not present

## 2021-07-10 DIAGNOSIS — M810 Age-related osteoporosis without current pathological fracture: Secondary | ICD-10-CM | POA: Diagnosis not present

## 2021-07-10 DIAGNOSIS — E7800 Pure hypercholesterolemia, unspecified: Secondary | ICD-10-CM | POA: Diagnosis not present

## 2021-07-10 DIAGNOSIS — I2510 Atherosclerotic heart disease of native coronary artery without angina pectoris: Secondary | ICD-10-CM | POA: Diagnosis not present

## 2021-08-07 DIAGNOSIS — N3281 Overactive bladder: Secondary | ICD-10-CM | POA: Diagnosis not present

## 2021-08-07 DIAGNOSIS — I10 Essential (primary) hypertension: Secondary | ICD-10-CM | POA: Diagnosis not present

## 2021-08-07 DIAGNOSIS — M810 Age-related osteoporosis without current pathological fracture: Secondary | ICD-10-CM | POA: Diagnosis not present

## 2021-08-07 DIAGNOSIS — E7800 Pure hypercholesterolemia, unspecified: Secondary | ICD-10-CM | POA: Diagnosis not present

## 2021-08-07 DIAGNOSIS — N390 Urinary tract infection, site not specified: Secondary | ICD-10-CM | POA: Diagnosis not present

## 2021-08-07 DIAGNOSIS — R413 Other amnesia: Secondary | ICD-10-CM | POA: Diagnosis not present

## 2021-08-10 DIAGNOSIS — I10 Essential (primary) hypertension: Secondary | ICD-10-CM | POA: Diagnosis not present

## 2021-08-10 DIAGNOSIS — E7800 Pure hypercholesterolemia, unspecified: Secondary | ICD-10-CM | POA: Diagnosis not present

## 2021-08-10 DIAGNOSIS — M810 Age-related osteoporosis without current pathological fracture: Secondary | ICD-10-CM | POA: Diagnosis not present

## 2021-08-10 DIAGNOSIS — I2510 Atherosclerotic heart disease of native coronary artery without angina pectoris: Secondary | ICD-10-CM | POA: Diagnosis not present

## 2021-09-06 DIAGNOSIS — E7800 Pure hypercholesterolemia, unspecified: Secondary | ICD-10-CM | POA: Diagnosis not present

## 2021-09-06 DIAGNOSIS — I10 Essential (primary) hypertension: Secondary | ICD-10-CM | POA: Diagnosis not present

## 2021-09-06 DIAGNOSIS — I2510 Atherosclerotic heart disease of native coronary artery without angina pectoris: Secondary | ICD-10-CM | POA: Diagnosis not present

## 2021-09-06 DIAGNOSIS — M810 Age-related osteoporosis without current pathological fracture: Secondary | ICD-10-CM | POA: Diagnosis not present

## 2021-09-11 DIAGNOSIS — Z1231 Encounter for screening mammogram for malignant neoplasm of breast: Secondary | ICD-10-CM | POA: Diagnosis not present

## 2021-09-11 DIAGNOSIS — M810 Age-related osteoporosis without current pathological fracture: Secondary | ICD-10-CM | POA: Diagnosis not present

## 2021-09-15 ENCOUNTER — Ambulatory Visit (HOSPITAL_COMMUNITY)
Admission: EM | Admit: 2021-09-15 | Discharge: 2021-09-15 | Disposition: A | Discharge status: Home / Self Care | Payer: Medicare Other | Attending: Internal Medicine | Admitting: Internal Medicine

## 2021-09-15 ENCOUNTER — Encounter (HOSPITAL_COMMUNITY): Payer: Self-pay | Admitting: Emergency Medicine

## 2021-09-15 ENCOUNTER — Other Ambulatory Visit: Payer: Self-pay

## 2021-09-15 DIAGNOSIS — N3001 Acute cystitis with hematuria: Secondary | ICD-10-CM | POA: Insufficient documentation

## 2021-09-15 LAB — POCT URINALYSIS DIPSTICK, ED / UC
Bilirubin Urine: NEGATIVE
Glucose, UA: NEGATIVE mg/dL
Ketones, ur: NEGATIVE mg/dL
Nitrite: POSITIVE — AB
Protein, ur: NEGATIVE mg/dL
Specific Gravity, Urine: 1.015 (ref 1.005–1.030)
Urobilinogen, UA: 0.2 mg/dL (ref 0.0–1.0)
pH: 5.5 (ref 5.0–8.0)

## 2021-09-15 MED ORDER — CEPHALEXIN 500 MG PO CAPS: 500.0000 mg | ORAL_CAPSULE | Freq: Four times a day (QID) | ORAL | 0 refills | Status: DC

## 2021-09-15 NOTE — ED Provider Notes (Signed)
[facility]    CSN: [id number] Arrival date & time: 09/15/21  8250      History   Chief Complaint Chief Complaint  Patient presents with   Hallucinations    HPI [name] is a 80 y.o. female presenting with urinary symptoms. Medical history noncontributory. Notes urinary frequency. Also with hallucinations: seeing spots and people that aren't there x2 days. Denies hematuria, dysuria, urgency, back pain, n/v/d/abd pain, fevers/chills, abdnormal vaginal discharge. Last UTI 1 year ago per son. Here today with son who helps provide history.  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      Home Medications    Prior to Admission medications   Medication Sig Start Date End Date Taking? Authorizing Provider  cephALEXin (KEFLEX) 500 MG capsule Take 1 capsule (500 mg total) by mouth 4 (four) times daily. 09/15/21  Yes Rhys Martini, PA-C  acetaminophen (TYLENOL) 500 MG tablet Take 1,000 mg by mouth every 6 (six) hours as needed for fever.    [provider]  amLODipine (NORVASC) 10 MG tablet Take 10 mg by mouth daily.    [provider]  aspirin 81 MG tablet Take 162 mg by mouth daily.     [provider]  atorvastatin (LIPITOR) 40 MG tablet Take 40 mg by mouth daily.    [provider]  calcium-vitamin D (OSCAL) 250-125 MG-UNIT per tablet Take 1 tablet by mouth daily.    [provider]   Chlorphen-Pseudoephed-APAP (CORICIDIN D PO) Take 1 tablet by mouth daily as needed (allergies).    [provider]  ibuprofen (ADVIL,MOTRIN) 200 MG tablet Take 200 mg by mouth every 6 (six) hours as needed for moderate pain.     [provider]  irbesartan (AVAPRO) 150 MG tablet Take 150 mg by mouth at bedtime.    [provider]  memantine (NAMENDA) 5 MG tablet Take 5 mg by mouth 2 (two) times daily.    [provider]  Multiple Vitamin (MULTIVITAMIN) tablet Take 1 tablet by mouth daily.    [provider]  solifenacin (VESICARE) 5 MG tablet Take 5 mg by mouth daily.    [provider]    Family History Family History  Problem Relation Age of Onset   Diabetes Mother    CAD Father    Diabetes Sister    Diabetes Brother    Diabetes Sister    Diabetes Sister    Cancer - Colon Brother    Cancer Brother    Alzheimer's disease Brother     Social History Social History   Tobacco Use   Smoking status: Never   Smokeless tobacco: Never  Vaping Use   Vaping Use: Never used  Substance Use Topics   Alcohol use: No   Drug use: No     Allergies   Aspirin  Review of Systems Review of Systems  Constitutional:  Negative for appetite change, chills, diaphoresis and fever.  Respiratory:  Negative for shortness of breath.   Cardiovascular:  Negative for chest pain.  Gastrointestinal:  Negative for abdominal pain, blood in stool, constipation, diarrhea, nausea and vomiting.  Genitourinary:  Positive for frequency. Negative for decreased urine volume, difficulty urinating, dysuria, flank pain, genital sores, hematuria and urgency.  Musculoskeletal:  Negative for back pain.  Neurological:  Negative for dizziness, weakness and light-headedness.  All other systems reviewed and are negative.   Physical Exam Triage Vital Signs ED Triage Vitals  Enc Vitals Group     BP      Pulse      Resp      Temp      Temp src      SpO2       Weight      Height      Head Circumference      Peak Flow      Pain Score      Pain Loc      Pain Edu?      Excl. in GC?    No data found.  Updated Vital Signs BP (!) 148/68   Pulse 66   Temp 98.2 F (36.8 C) (Oral)   Resp 18   LMP  (LMP Unknown)   SpO2 98%   Visual Acuity Right Eye Distance:   Left Eye Distance:   Bilateral Distance:    Right Eye Near:   Left Eye Near:    Bilateral Near:     Physical Exam Vitals reviewed.  Constitutional:      General: She is not in acute distress.    Appearance: Normal appearance. She is not ill-appearing.  HENT:     Head: Normocephalic and atraumatic.     Mouth/Throat:     Mouth: Mucous membranes are moist.     Comments: Moist mucous membranes Eyes:     Extraocular Movements: Extraocular movements intact.     Pupils: Pupils are equal, round, and reactive to light.  Cardiovascular:     Rate and Rhythm: Normal rate and regular rhythm.     Heart sounds: Normal heart sounds.  Pulmonary:     Effort: Pulmonary effort is normal.     Breath sounds: Normal breath sounds. No wheezing, rhonchi or rales.  Abdominal:     General: Bowel sounds are normal. There is no distension.     Palpations: Abdomen is soft. There is no mass.     Tenderness: There is no abdominal tenderness. There is no right CVA tenderness, left CVA tenderness, guarding or rebound.  Skin:    General: Skin is warm.     Capillary Refill: Capillary refill takes less than 2 seconds.     Comments: Good skin turgor  Neurological:     General: No focal deficit present.     Mental Status: She is alert and oriented to person, place, and time.  Psychiatric:        Mood and Affect: Mood normal.        Behavior: Behavior normal.     UC Treatments / Results  Labs (all labs ordered are listed, but only abnormal results are displayed) Labs Reviewed  POCT URINALYSIS DIPSTICK, ED / UC - Abnormal; Notable for the following components:      Result Value   Hgb urine  dipstick SMALL (*)    Nitrite POSITIVE (*)    Leukocytes,Ua TRACE (*)  All other components within normal limits  URINE CULTURE    EKG   Radiology No results found.  Procedures Procedures (including critical care time)  Medications Ordered in UC Medications - No data to display  Initial Impression / Assessment and Plan / UC Course  I have reviewed the triage vital signs and the nursing notes.  Pertinent labs & imaging results that were available during my care of the patient were reviewed by me and considered in my medical decision making (see chart for details).     This patient is a very pleasant 80 y.o. year old female presenting with acute cystitis. Afebrile, nontachycardic, no reproducible abd pain or CVAT. Last UTI approx 1 year ago.  UA with small blood, positive nitrite, trace leuk. Culture sent.   Keflex as below.   ED return precautions discussed. Patient and son verbalizes understanding and agreement.      Final Clinical Impressions(s) / UC Diagnoses   Final diagnoses:  Acute cystitis with hematuria     Discharge Instructions      -Start the antibiotic: Keflex, 4x daily x5 days. You can take this with food if you have a sensitive stomach. -Drink plenty of fluids -See additional medical attention if symptoms getting worse instead of better- abd pain, new back pain, new fevers/chills, etc.      ED Prescriptions     Medication Sig Dispense Auth. Provider   cephALEXin (KEFLEX) 500 MG capsule Take 1 capsule (500 mg total) by mouth 4 (four) times daily. 20 capsule Rhys Martini, PA-C      PDMP not reviewed this encounter.   Rhys Martini, PA-C 09/15/21 1123

## 2021-09-15 NOTE — Discharge Instructions (Addendum)
-  Start the antibiotic: Keflex, 4x daily x5 days. You can take this with food if you have a sensitive stomach. -Drink plenty of fluids -See additional medical attention if symptoms getting worse instead of better- abd pain, new back pain, new fevers/chills, etc.

## 2021-09-15 NOTE — ED Triage Notes (Signed)
Pt presents with c/o hallucinations. Pt's son reports pt has been having hallucinations the past couple days with seeing spots and people that aren't there. Pt reports urinary frequency, however denies burning with urination.

## 2021-09-17 LAB — URINE CULTURE: Culture: >=100000 — AB

## 2021-10-05 DIAGNOSIS — E7800 Pure hypercholesterolemia, unspecified: Secondary | ICD-10-CM | POA: Diagnosis not present

## 2021-10-05 DIAGNOSIS — I10 Essential (primary) hypertension: Secondary | ICD-10-CM | POA: Diagnosis not present

## 2021-10-05 DIAGNOSIS — I2510 Atherosclerotic heart disease of native coronary artery without angina pectoris: Secondary | ICD-10-CM | POA: Diagnosis not present

## 2021-10-05 DIAGNOSIS — M810 Age-related osteoporosis without current pathological fracture: Secondary | ICD-10-CM | POA: Diagnosis not present

## 2021-10-13 DIAGNOSIS — M810 Age-related osteoporosis without current pathological fracture: Secondary | ICD-10-CM | POA: Diagnosis not present

## 2021-10-13 DIAGNOSIS — Z23 Encounter for immunization: Secondary | ICD-10-CM | POA: Diagnosis not present

## 2021-11-06 DIAGNOSIS — I2510 Atherosclerotic heart disease of native coronary artery without angina pectoris: Secondary | ICD-10-CM | POA: Diagnosis not present

## 2021-11-06 DIAGNOSIS — E7800 Pure hypercholesterolemia, unspecified: Secondary | ICD-10-CM | POA: Diagnosis not present

## 2021-11-06 DIAGNOSIS — I10 Essential (primary) hypertension: Secondary | ICD-10-CM | POA: Diagnosis not present

## 2021-11-06 DIAGNOSIS — M810 Age-related osteoporosis without current pathological fracture: Secondary | ICD-10-CM | POA: Diagnosis not present

## 2022-01-03 DIAGNOSIS — E7800 Pure hypercholesterolemia, unspecified: Secondary | ICD-10-CM | POA: Diagnosis not present

## 2022-01-03 DIAGNOSIS — I10 Essential (primary) hypertension: Secondary | ICD-10-CM | POA: Diagnosis not present

## 2022-02-01 DIAGNOSIS — E7800 Pure hypercholesterolemia, unspecified: Secondary | ICD-10-CM | POA: Diagnosis not present

## 2022-02-01 DIAGNOSIS — I10 Essential (primary) hypertension: Secondary | ICD-10-CM | POA: Diagnosis not present

## 2022-02-05 DIAGNOSIS — E7800 Pure hypercholesterolemia, unspecified: Secondary | ICD-10-CM | POA: Diagnosis not present

## 2022-02-05 DIAGNOSIS — N3281 Overactive bladder: Secondary | ICD-10-CM | POA: Diagnosis not present

## 2022-02-05 DIAGNOSIS — F5101 Primary insomnia: Secondary | ICD-10-CM | POA: Diagnosis not present

## 2022-02-05 DIAGNOSIS — H9313 Tinnitus, bilateral: Secondary | ICD-10-CM | POA: Diagnosis not present

## 2022-02-05 DIAGNOSIS — I2510 Atherosclerotic heart disease of native coronary artery without angina pectoris: Secondary | ICD-10-CM | POA: Diagnosis not present

## 2022-02-05 DIAGNOSIS — I10 Essential (primary) hypertension: Secondary | ICD-10-CM | POA: Diagnosis not present

## 2022-02-05 DIAGNOSIS — M810 Age-related osteoporosis without current pathological fracture: Secondary | ICD-10-CM | POA: Diagnosis not present

## 2022-02-05 DIAGNOSIS — N390 Urinary tract infection, site not specified: Secondary | ICD-10-CM | POA: Diagnosis not present

## 2022-02-05 DIAGNOSIS — Z0000 Encounter for general adult medical examination without abnormal findings: Secondary | ICD-10-CM | POA: Diagnosis not present

## 2022-03-01 DIAGNOSIS — N390 Urinary tract infection, site not specified: Secondary | ICD-10-CM | POA: Diagnosis not present

## 2022-03-15 DIAGNOSIS — N390 Urinary tract infection, site not specified: Secondary | ICD-10-CM | POA: Diagnosis not present

## 2022-04-02 DIAGNOSIS — I10 Essential (primary) hypertension: Secondary | ICD-10-CM | POA: Diagnosis not present

## 2022-04-02 DIAGNOSIS — E7800 Pure hypercholesterolemia, unspecified: Secondary | ICD-10-CM | POA: Diagnosis not present

## 2022-04-09 ENCOUNTER — Ambulatory Visit (HOSPITAL_COMMUNITY)
Admission: EM | Admit: 2022-04-09 | Discharge: 2022-04-09 | Disposition: A | Discharge status: Home / Self Care | Payer: Medicare Other | Attending: Student | Admitting: Student

## 2022-04-09 ENCOUNTER — Encounter (HOSPITAL_COMMUNITY): Payer: Self-pay

## 2022-04-09 DIAGNOSIS — R21 Rash and other nonspecific skin eruption: Secondary | ICD-10-CM | POA: Diagnosis not present

## 2022-04-09 DIAGNOSIS — L210 Seborrhea capitis: Secondary | ICD-10-CM

## 2022-04-09 MED ORDER — TRIAMCINOLONE ACETONIDE 0.1 % EX CREA: 1.0000 "application " | TOPICAL_CREAM | Freq: Two times a day (BID) | CUTANEOUS | 0 refills | Status: AC

## 2022-04-09 NOTE — ED Triage Notes (Signed)
Pt c/o itchy painful rash to rt side and back of neck since Friday. States using a body gel but never has an reaction before.  ?

## 2022-04-09 NOTE — Discharge Instructions (Addendum)
-  Triamcinolone cream applied to the affected area twice daily  ?-Selsun blue dandruff shampoo (purchased over-the-counter) when you wash your hair ?-Use only unscented sensitive soap ?-Benedryl at night  ?

## 2022-04-09 NOTE — ED Provider Notes (Signed)
?[facility] ? ? ? ?CSN: [id number] ?Arrival date & time: 04/09/22  1105 ? ? ?  ? ?History   ?Chief Complaint ?Chief Complaint  ?Patient presents with  ? Rash  ? ? ?HPI ?MILLIANA REDDOCH is a 81 y.o. female presenting with itchy rash x2 weeks. History noncontributory. Here today with son who helps provide history. Describes itchy rash that starts behind her R ear and extends down her neck. Symptoms developed following starting new fragranced bodywash, which she only uses in the shower. Son has been applying ketoconazole cream which has not provided relief. Denies facial involvement. Denies other new products or outdoor exposure.  ? ?HPI ? ?Past Medical History:  ?Diagnosis Date  ? Heart murmur   ? Hyperlipidemia   ? Hypertension   ? Osteoporosis   ? Seasonal allergies   ? ? ?Patient Active Problem List  ? Diagnosis Date Noted  ? HYPERCHOLESTEROLEMIA 01/23/2007  ? HYPERTENSION, BENIGN SYSTEMIC 01/23/2007  ? 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 OSTEOPOROSIS, UNSPECIFIED 01/23/2007  ? INCONTINENCE, URGE 01/23/2007  ? ? ?Past Surgical History:  ?Procedure Laterality Date  ? CATARACT EXTRACTION    ? FRACTURE SURGERY    ? ORIF ANKLE FRACTURE    ? TUBAL LIGATION    ? WRIST SURGERY    ? ? ?OB History   ?No obstetric history on file. ?  ? ? ? ?Home Medications   ? ?Prior to Admission medications   ?Medication Sig Start Date End Date Taking? Authorizing Provider  ?triamcinolone cream (KENALOG) 0.1 % Apply 1 application. topically 2 (two) times daily for 7 days. 04/09/22 04/16/22 Yes Rhys Martini, PA-C  ?acetaminophen (TYLENOL) 500 MG tablet Take 1,000 mg by mouth every 6 (six) hours as needed for fever.    [provider]  ?amLODipine (NORVASC) 10 MG tablet Take 10 mg by mouth daily.    [provider]  ?aspirin 81 MG tablet Take 162 mg by mouth daily.     [provider]  ?atorvastatin (LIPITOR) 40 MG tablet Take 40 mg by mouth daily.    [provider]  ?calcium-vitamin D (OSCAL) 250-125  MG-UNIT per tablet Take 1 tablet by mouth daily.    [provider]  ?cephALEXin (KEFLEX) 500 MG capsule Take 1 capsule (500 mg total) by mouth 4 (four) times daily. 09/15/21   Rhys Martini, PA-C  ?Chlorphen-Pseudoephed-APAP (CORICIDIN D PO) Take 1 tablet by mouth daily as needed (allergies).    [provider]  ?ibuprofen (ADVIL,MOTRIN) 200 MG tablet Take 200 mg by mouth every 6 (six) hours as needed for moderate pain.     [provider]  ?irbesartan (AVAPRO) 150 MG tablet Take 150 mg by mouth at bedtime.    [provider]  ?memantine (NAMENDA) 5 MG tablet Take 5 mg by mouth 2 (two) times daily.    [provider]  ?Multiple Vitamin (MULTIVITAMIN) tablet Take 1 tablet by mouth daily.    [provider]  ?solifenacin (VESICARE) 5 MG tablet Take 5 mg by mouth daily.    [provider]  ? ? ?Family History ?Family History  ?Problem Relation Age of Onset  ? Diabetes Mother   ? CAD Father   ? Diabetes Sister   ? Diabetes Brother   ? Diabetes Sister   ? Diabetes Sister   ? Cancer - Colon Brother   ? Cancer Brother   ? Alzheimer's disease Brother   ? ? ?Social History ?Social History  ? ?Tobacco Use  ?  Smoking status: Never  ? Smokeless tobacco: Never  ?Vaping Use  ? Vaping Use: Never used  ?Substance Use Topics  ? Alcohol use: No  ? Drug use: No  ? ? ? ?Allergies   ?Aspirin ? ? ?Review of Systems ?Review of Systems  ?Skin:  Positive for rash.  ?All other systems reviewed and are negative. ? ? ?Physical Exam ?Triage Vital Signs ?ED Triage Vitals [04/09/22 1234]  ?Enc Vitals Group  ?   BP 138/81  ?   Pulse Rate 63  ?   Resp 18  ?   Temp 97.8 ?F (36.6 ?C)  ?   Temp Source Oral  ?   SpO2 98 %  ?   Weight   ?   Height   ?   Head Circumference   ?   Peak Flow   ?   Pain Score 0  ?   Pain Loc   ?   Pain Edu?   ?   Excl. in GC?   ? ?No data found. ? ?Updated Vital Signs ?BP 138/81 (BP Location: Left Arm)   Pulse 63   Temp 97.8 ?F (36.6 ?C) (Oral)   Resp 18    LMP  (LMP Unknown)   SpO2 98%  ? ?Visual Acuity ?Right Eye Distance:   ?Left Eye Distance:   ?Bilateral Distance:   ? ?Right Eye Near:   ?Left Eye Near:    ?Bilateral Near:    ? ?Physical Exam ?Vitals reviewed.  ?Constitutional:   ?   General: She is not in acute distress. ?   Appearance: Normal appearance. She is not ill-appearing.  ?HENT:  ?   Head: Normocephalic and atraumatic.  ?Pulmonary:  ?   Effort: Pulmonary effort is normal.  ?Skin: ?   Comments: Dry scaly skin R posterior ear and neck, without color change or warmth. Dandruff present throughout scalp.no lesions on face or ears. No facial, lip, tongue swelling.  ?Neurological:  ?   General: No focal deficit present.  ?   Mental Status: She is alert and oriented to person, place, and time.  ?Psychiatric:     ?   Mood and Affect: Mood normal.     ?   Behavior: Behavior normal.     ?   Thought Content: Thought content normal.     ?   Judgment: Judgment normal.  ? ? ? ?UC Treatments / Results  ?Labs ?(all labs ordered are listed, but only abnormal results are displayed) ?Labs Reviewed - No data to display ? ?EKG ? ? ?Radiology ?No results found. ? ?Procedures ?Procedures (including critical care time) ? ?Medications Ordered in UC ?Medications - No data to display ? ?Initial Impression / Assessment and Plan / UC Course  ?I have reviewed the triage vital signs and the nursing notes. ? ?Pertinent labs & imaging results that were available during my care of the patient were reviewed by me and considered in my medical decision making (see chart for details). ? ?  ? ?This patient is a very pleasant 81 y.o. year old female presenting with dandruff and skin irritation following new bodywash. Stop the bodywash, and start dandruff shampoo. Also sent triamcinolone ointment. Stop kenalog as she does not have a fungal infection. ED return precautions discussed. Son verbalizes understanding and agreement.  ?.  ? ?Final Clinical Impressions(s) / UC Diagnoses  ? ?Final  diagnoses:  ?Rash and nonspecific skin eruption  ?Dandruff  ? ? ? ?Discharge Instructions   ? ?  ?-  Triamcinolone cream applied to the affected area twice daily  ?-Selsun blue dandruff shampoo (purchased over-the-counter) when you wash your hair ?-Use only unscented sensitive soap ?-Benedryl at night  ? ? ?ED Prescriptions   ? ? Medication Sig Dispense Auth. Provider  ? triamcinolone cream (KENALOG) 0.1 % Apply 1 application. topically 2 (two) times daily for 7 days. 30 g Rhys Martini, PA-C  ? ?  ? ?PDMP not reviewed this encounter. ?  ?Rhys Martini, PA-C ?04/09/22 1258 ? ?

## 2022-04-26 DIAGNOSIS — K644 Residual hemorrhoidal skin tags: Secondary | ICD-10-CM | POA: Diagnosis not present

## 2022-04-26 DIAGNOSIS — R21 Rash and other nonspecific skin eruption: Secondary | ICD-10-CM | POA: Diagnosis not present

## 2022-04-26 DIAGNOSIS — K5900 Constipation, unspecified: Secondary | ICD-10-CM | POA: Diagnosis not present

## 2022-05-02 DIAGNOSIS — I10 Essential (primary) hypertension: Secondary | ICD-10-CM | POA: Diagnosis not present

## 2022-05-02 DIAGNOSIS — E7800 Pure hypercholesterolemia, unspecified: Secondary | ICD-10-CM | POA: Diagnosis not present

## 2022-07-02 DIAGNOSIS — E7800 Pure hypercholesterolemia, unspecified: Secondary | ICD-10-CM | POA: Diagnosis not present

## 2022-07-02 DIAGNOSIS — I10 Essential (primary) hypertension: Secondary | ICD-10-CM | POA: Diagnosis not present

## 2022-07-05 DIAGNOSIS — N3000 Acute cystitis without hematuria: Secondary | ICD-10-CM | POA: Diagnosis not present

## 2022-07-05 DIAGNOSIS — N390 Urinary tract infection, site not specified: Secondary | ICD-10-CM | POA: Diagnosis not present

## 2022-07-11 DIAGNOSIS — N3000 Acute cystitis without hematuria: Secondary | ICD-10-CM | POA: Diagnosis not present

## 2022-07-11 DIAGNOSIS — R944 Abnormal results of kidney function studies: Secondary | ICD-10-CM | POA: Diagnosis not present

## 2022-07-13 DIAGNOSIS — R311 Benign essential microscopic hematuria: Secondary | ICD-10-CM | POA: Diagnosis not present

## 2022-07-13 DIAGNOSIS — N3021 Other chronic cystitis with hematuria: Secondary | ICD-10-CM | POA: Diagnosis not present

## 2022-08-20 DIAGNOSIS — E7800 Pure hypercholesterolemia, unspecified: Secondary | ICD-10-CM | POA: Diagnosis not present

## 2022-08-20 DIAGNOSIS — R42 Dizziness and giddiness: Secondary | ICD-10-CM | POA: Diagnosis not present

## 2022-08-20 DIAGNOSIS — M810 Age-related osteoporosis without current pathological fracture: Secondary | ICD-10-CM | POA: Diagnosis not present

## 2022-08-20 DIAGNOSIS — I10 Essential (primary) hypertension: Secondary | ICD-10-CM | POA: Diagnosis not present

## 2022-09-11 DIAGNOSIS — N390 Urinary tract infection, site not specified: Secondary | ICD-10-CM | POA: Diagnosis not present

## 2022-09-13 ENCOUNTER — Other Ambulatory Visit: Payer: Self-pay

## 2022-09-13 ENCOUNTER — Emergency Department (HOSPITAL_COMMUNITY): Payer: Medicare Other

## 2022-09-13 ENCOUNTER — Emergency Department (HOSPITAL_COMMUNITY)
Admission: EM | Admit: 2022-09-13 | Discharge: 2022-09-13 | Disposition: A | Discharge status: Home / Self Care | Payer: Medicare Other | Attending: Emergency Medicine | Admitting: Emergency Medicine

## 2022-09-13 ENCOUNTER — Encounter (HOSPITAL_COMMUNITY): Payer: Self-pay

## 2022-09-13 DIAGNOSIS — N390 Urinary tract infection, site not specified: Secondary | ICD-10-CM | POA: Insufficient documentation

## 2022-09-13 DIAGNOSIS — Y92512 Supermarket, store or market as the place of occurrence of the external cause: Secondary | ICD-10-CM | POA: Insufficient documentation

## 2022-09-13 DIAGNOSIS — S20211A Contusion of right front wall of thorax, initial encounter: Secondary | ICD-10-CM | POA: Insufficient documentation

## 2022-09-13 DIAGNOSIS — W01198A Fall on same level from slipping, tripping and stumbling with subsequent striking against other object, initial encounter: Secondary | ICD-10-CM | POA: Insufficient documentation

## 2022-09-13 DIAGNOSIS — W19XXXA Unspecified fall, initial encounter: Secondary | ICD-10-CM

## 2022-09-13 DIAGNOSIS — I700 Atherosclerosis of aorta: Secondary | ICD-10-CM | POA: Insufficient documentation

## 2022-09-13 DIAGNOSIS — R109 Unspecified abdominal pain: Secondary | ICD-10-CM | POA: Insufficient documentation

## 2022-09-13 DIAGNOSIS — I10 Essential (primary) hypertension: Secondary | ICD-10-CM | POA: Diagnosis not present

## 2022-09-13 DIAGNOSIS — S299XXA Unspecified injury of thorax, initial encounter: Secondary | ICD-10-CM | POA: Diagnosis not present

## 2022-09-13 DIAGNOSIS — R0781 Pleurodynia: Secondary | ICD-10-CM | POA: Diagnosis not present

## 2022-09-13 DIAGNOSIS — I2510 Atherosclerotic heart disease of native coronary artery without angina pectoris: Secondary | ICD-10-CM | POA: Insufficient documentation

## 2022-09-13 DIAGNOSIS — Z043 Encounter for examination and observation following other accident: Secondary | ICD-10-CM | POA: Diagnosis not present

## 2022-09-13 DIAGNOSIS — N281 Cyst of kidney, acquired: Secondary | ICD-10-CM | POA: Diagnosis not present

## 2022-09-13 DIAGNOSIS — N289 Disorder of kidney and ureter, unspecified: Secondary | ICD-10-CM | POA: Diagnosis not present

## 2022-09-13 LAB — I-STAT CHEM 8, ED
BUN: 26 mg/dL — ABNORMAL HIGH (ref 8–23)
Calcium, Ion: 1.26 mmol/L (ref 1.15–1.40)
Chloride: 107 mmol/L (ref 98–111)
Creatinine, Ser: 1 mg/dL (ref 0.44–1.00)
Glucose, Bld: 100 mg/dL — ABNORMAL HIGH (ref 70–99)
HCT: 45 % (ref 36.0–46.0)
Hemoglobin: 15.3 g/dL — ABNORMAL HIGH (ref 12.0–15.0)
Potassium: 4.5 mmol/L (ref 3.5–5.1)
Sodium: 142 mmol/L (ref 135–145)
TCO2: 24 mmol/L (ref 22–32)

## 2022-09-13 MED ORDER — IOHEXOL 300 MG/ML  SOLN
80.0000 mL | Freq: Once | INTRAMUSCULAR | Status: AC | PRN
  Administered 2022-09-13: 80 mL via INTRAVENOUS

## 2022-09-13 MED ORDER — ACETAMINOPHEN 325 MG PO TABS
650.0000 mg | ORAL_TABLET | Freq: Once | ORAL | Status: AC
  Administered 2022-09-13: 650 mg via ORAL
  Filled 2022-09-13: qty 2

## 2022-09-13 NOTE — ED Triage Notes (Signed)
Pt reports mechanical fall in  store hitting her right side on floor. Pain worse with inhalation. Reports current on abt for UTI.  Denies blood thinner usage and hitting head.

## 2022-09-13 NOTE — ED Provider Notes (Signed)
[facility]EMERGENCY DEPT Provider Note   CSN: [id number] Arrival date & time: 09/13/22  1093     History  Chief Complaint  Patient presents with   [name]    [name] is a 81 y.o. female.  Patient with a history of dementia.  Son at bedside provides most of the history.  Son reports patient fell in a convenience store about 3 AM in [place] on the way back to [place].  The fall was not witnessed but the son was close by and heard the patient fall.  She believes she has been generally weak since she was diagnosed with UTI 2 days ago.  Denies any preceding dizziness or lightheadedness.  Believes she stumbled.  Did not hit her head.  Complains of pain to her right ribs and abdomen.  No blood thinner use.  No neck or back pain.  No chest pain, shortness of breath, nausea or vomiting.  She has been on antibiotics for a UTI that was diagnosed in [place] 2 days ago.  She denies any dizziness or lightheadedness.  No syncope  The history is provided by the patient and a relative.  Fall       Home Medications Prior to Admission medications   Medication Sig Start Date End Date Taking? Authorizing Provider  acetaminophen (TYLENOL) 500 MG tablet Take 1,000 mg by mouth every 6 (six) hours as needed for fever.    [provider]  amLODipine (NORVASC) 10 MG tablet Take 10 mg by mouth daily.    [provider]  aspirin 81 MG tablet Take 162 mg by mouth daily.     [provider]  atorvastatin (LIPITOR) 40 MG tablet Take 40 mg by mouth daily.    [provider]  calcium-vitamin D (OSCAL) 250-125 MG-UNIT per tablet Take 1 tablet by mouth daily.    [provider]  cephALEXin (KEFLEX) 500 MG capsule Take 1 capsule (500 mg total) by mouth 4 (four) times daily. 09/15/21   Rhys Martini, PA-C  Chlorphen-Pseudoephed-APAP (CORICIDIN D PO) Take 1 tablet by mouth daily as needed (allergies).    [provider]   ibuprofen (ADVIL,MOTRIN) 200 MG tablet Take 200 mg by mouth every 6 (six) hours as needed for moderate pain.     [provider]  irbesartan (AVAPRO) 150 MG tablet Take 150 mg by mouth at bedtime.    [provider]  memantine (NAMENDA) 5 MG tablet Take 5 mg by mouth 2 (two) times daily.    [provider]  Multiple Vitamin (MULTIVITAMIN) tablet Take 1 tablet by mouth daily.    [provider]  solifenacin (VESICARE) 5 MG tablet Take 5 mg by mouth daily.    [provider]      Allergies    Aspirin    Review of Systems   Review of Systems  Unable to perform ROS: Dementia    Physical Exam Updated Vital Signs BP (!) 143/67 (BP Location: Left Arm)   Pulse 61   Temp 98.5 F (36.9 C) (Oral)   Resp 15   Ht 5\' 2"  (1.575 m)   Wt 68 kg   LMP  (LMP Unknown)   SpO2 96%   BMI 27.44 kg/m  Physical Exam Vitals and nursing note reviewed.  Constitutional:      General: She is not in acute distress.    Appearance: She is well-developed.  HENT:     Head: Normocephalic and atraumatic.  Mouth/Throat:     Pharynx: No oropharyngeal exudate.  Eyes:     Conjunctiva/sclera: Conjunctivae normal.     Pupils: Pupils are equal, round, and reactive to light.  Neck:     Comments: No C-spine tenderness Cardiovascular:     Rate and Rhythm: Normal rate and regular rhythm.     Heart sounds: Normal heart sounds. No murmur heard. Pulmonary:     Effort: Pulmonary effort is normal. No respiratory distress.     Breath sounds: Normal breath sounds.     Comments: Right lateral and anterior rib pain, no crepitus or ecchymosis. Chest:     Chest wall: Tenderness present.  Abdominal:     Palpations: Abdomen is soft.     Tenderness: There is abdominal tenderness. There is no guarding or rebound.     Comments: TTP upper abdomen. No guarding or rebound. No ecchymosis.  Musculoskeletal:        General: No tenderness. Normal range of motion.     Cervical back:  Normal range of motion and neck supple.     Comments: No T or L-spine tenderness  Skin:    General: Skin is warm.  Neurological:     Mental Status: She is alert and oriented to person, place, and time.     Cranial Nerves: No cranial nerve deficit.     Motor: No abnormal muscle tone.     Coordination: Coordination normal.     Comments:  5/5 strength throughout. CN 2-12 intact.Equal grip strength.   Psychiatric:        Behavior: Behavior normal.     ED Results / Procedures / Treatments   Labs (all labs ordered are listed, but only abnormal results are displayed) Labs Reviewed  I-STAT CHEM 8, ED - Abnormal; Notable for the following components:      Result Value   BUN 26 (*)    Glucose, Bld 100 (*)    Hemoglobin 15.3 (*)    All other components within normal limits    EKG EKG Interpretation  Date/Time:  Thursday September 13 2022 12:10:33 EDT Ventricular Rate:  58 PR Interval:  200 QRS Duration: 111 QT Interval:  437 QTC Calculation: 430 R Axis:   32 Text Interpretation: Sinus rhythm Borderline repolarization abnormality No significant change was found Confirmed by Glynn Octave [phone]) on 09/13/2022 12:13:46 PM  Radiology CT CHEST ABDOMEN PELVIS W CONTRAST  Result Date: 09/13/2022 CLINICAL DATA:  81 year old female with history of trauma from a fall. EXAM: CT CHEST, ABDOMEN, AND PELVIS WITH CONTRAST TECHNIQUE: Multidetector CT imaging of the chest, abdomen and pelvis was performed following the standard protocol during bolus administration of intravenous contrast. RADIATION DOSE REDUCTION: This exam was performed according to the departmental dose-optimization program which includes automated exposure control, adjustment of the mA and/or kV according to patient size and/or use of iterative reconstruction technique. CONTRAST:  67mL OMNIPAQUE IOHEXOL 300 MG/ML  SOLN COMPARISON:  No priors. FINDINGS: CT CHEST FINDINGS Cardiovascular: No abnormal high attenuation fluid within  the mediastinum to suggest posttraumatic mediastinal hematoma. No evidence of posttraumatic aortic dissection/transection. Heart size is normal. There is no significant pericardial fluid, thickening or pericardial calcification. There is aortic atherosclerosis, as well as atherosclerosis of the great vessels of the mediastinum and the coronary arteries, including calcified atherosclerotic plaque in the left main, left anterior descending, left circumflex and right coronary arteries. Thickening and calcification of the aortic valve. Mediastinum/Nodes: No pathologically enlarged mediastinal or hilar lymph nodes. Densely calcified mediastinal and left hilar  lymph nodes are incidentally noted. Esophagus is unremarkable in appearance. No axillary lymphadenopathy. Lungs/Pleura: No pneumothorax. No acute consolidative airspace disease. No pleural effusions. No suspicious appearing pulmonary nodules or masses are noted. Musculoskeletal: There are no acute displaced fractures or aggressive appearing lytic or blastic lesions noted in the visualized portions of the skeleton. CT ABDOMEN PELVIS FINDINGS Hepatobiliary: No evidence of significant acute traumatic injury to the liver. Small low-attenuation lesions in segment 2 and segment 8 of the liver, too small to definitively characterize, but statistically likely to represent tiny cysts or biliary hamartomas. No other suspicious appearing hepatic lesions. No intra or extrahepatic biliary ductal dilatation. Gallbladder is normal in appearance. Pancreas: No evidence of significant acute traumatic injury to the pancreas. No pancreatic mass. No pancreatic ductal dilatation. No pancreatic or peripancreatic fluid collections or inflammatory changes. Spleen: No evidence of significant acute traumatic injury to the spleen. Calcified granulomas throughout the spleen. Adrenals/Urinary Tract: No evidence of significant acute traumatic injury to either kidney or adrenal gland. Exophytic  low-attenuation lesion measuring 4.2 cm in the upper pole of the right kidney is compatible with a simple cyst (no imaging follow-up recommended). Subcentimeter low-attenuation lesion in the upper pole of the left kidney, too small to definitively characterize, but statistically likely tiny cysts (no imaging follow-up recommended). No hydroureteronephrosis. Urinary bladder is intact and normal in appearance. Stomach/Bowel: No definitive evidence to suggest significant acute traumatic injury to the hollow viscera. The appearance of the stomach is normal. There is no pathologic dilatation of small bowel or colon. Normal appendix. Vascular/Lymphatic: Atherosclerosis in the abdominal and pelvic vasculature, without evidence of aneurysm or dissection. No lymphadenopathy noted in the abdomen or pelvis. Reproductive: Multiple small calcifications within the uterus, presumably tiny calcified fibroids. Ovaries are unremarkable in appearance. Other: No high attenuation fluid collection in the peritoneal cavity or retroperitoneum to suggest significant posttraumatic hemorrhage. Musculoskeletal: There are no acute displaced fractures or aggressive appearing lytic or blastic lesions noted in the visualized portions of the skeleton. IMPRESSION: 1. No evidence of significant acute traumatic injury to the chest, abdomen or pelvis. 2. Aortic atherosclerosis, in addition to left main and three-vessel coronary artery disease. 3. There are calcifications of the aortic valve. Echocardiographic correlation for evaluation of potential valvular dysfunction may be warranted if clinically indicated. 4. Additional incidental findings, as above. Electronically Signed   By: Vinnie Langton M.D.   On: 09/13/2022 11:22   DG Ribs Unilateral W/Chest Right  Result Date: 09/13/2022 CLINICAL DATA:  Right rib pain after fall. EXAM: RIGHT RIBS AND CHEST - 3+ VIEW COMPARISON:  November 17, 2016. FINDINGS: No fracture or other bone lesions are seen  involving the ribs. There is no evidence of pneumothorax or pleural effusion. Both lungs are clear. Heart size and mediastinal contours are within normal limits. IMPRESSION: Negative. Electronically Signed   By: Marijo Conception M.D.   On: 09/13/2022 09:20    Procedures Procedures    Medications Ordered in ED Medications  acetaminophen (TYLENOL) tablet 650 mg (has no administration in time range)    ED Course/ Medical Decision Making/ A&P                           Medical Decision Making Amount and/or Complexity of Data Reviewed Independent Historian: caregiver Labs: ordered. Decision-making details documented in ED Course. Radiology: ordered and independent interpretation performed. Decision-making details documented in ED Course. ECG/medicine tests: ordered and independent interpretation performed. Decision-making details documented in  ED Course.  Risk OTC drugs. Prescription drug management.  Right rib pain after falling overnight.  Denies head injury.  No blood thinner use.  No hypoxia or increased work of breathing. EKG is sinus rhythm.  No ST changes.  X-ray is negative for fracture or pneumothorax.  Results reviewed and interpreted by me.  CT scan shows no displaced rib fracture, pneumothorax, intrathoracic or intra-abdominal injury.  Results reviewed interpreted by me.  Family made aware of incidental coronary calcifications and aortic calcifications.   Pain has resolved on recheck.  Patient tolerating p.o. and ambulatory.  Denies any pain currently.  Recommend supportive care at home and PCP follow-up.  Return precautions discussed.        Final Clinical Impression(s) / ED Diagnoses Final diagnoses:  Fall, initial encounter  Contusion of rib on right side, initial encounter    Rx / DC Orders ED Discharge Orders     None         Baby Gieger, Jeannett Senior, MD 09/13/22 [phone]

## 2022-09-13 NOTE — Discharge Instructions (Signed)
Your testing is reassuring and shows no significant injury from your fall.  No fractures.  Your CT scan did show the existing calcifications in your coronary arteries as well as some thickening of your aortic valve which you should follow-up with your primary doctor about for a echocardiogram and possible stress test.  Tylenol as needed for pain.  Return to the ED with new or worsening symptoms.

## 2022-09-24 DIAGNOSIS — R2681 Unsteadiness on feet: Secondary | ICD-10-CM | POA: Diagnosis not present

## 2022-09-24 DIAGNOSIS — N390 Urinary tract infection, site not specified: Secondary | ICD-10-CM | POA: Diagnosis not present

## 2022-10-12 DIAGNOSIS — N3021 Other chronic cystitis with hematuria: Secondary | ICD-10-CM | POA: Diagnosis not present

## 2023-02-07 DIAGNOSIS — N3021 Other chronic cystitis with hematuria: Secondary | ICD-10-CM | POA: Diagnosis not present

## 2023-02-07 DIAGNOSIS — R8271 Bacteriuria: Secondary | ICD-10-CM | POA: Diagnosis not present

## 2023-02-18 DIAGNOSIS — I10 Essential (primary) hypertension: Secondary | ICD-10-CM | POA: Diagnosis not present

## 2023-02-18 DIAGNOSIS — G301 Alzheimer's disease with late onset: Secondary | ICD-10-CM | POA: Diagnosis not present

## 2023-02-18 DIAGNOSIS — E7800 Pure hypercholesterolemia, unspecified: Secondary | ICD-10-CM | POA: Diagnosis not present

## 2023-02-18 DIAGNOSIS — M810 Age-related osteoporosis without current pathological fracture: Secondary | ICD-10-CM | POA: Diagnosis not present

## 2023-02-18 DIAGNOSIS — Z0000 Encounter for general adult medical examination without abnormal findings: Secondary | ICD-10-CM | POA: Diagnosis not present

## 2023-02-18 DIAGNOSIS — F03B11 Unspecified dementia, moderate, with agitation: Secondary | ICD-10-CM | POA: Diagnosis not present

## 2023-02-18 DIAGNOSIS — N390 Urinary tract infection, site not specified: Secondary | ICD-10-CM | POA: Diagnosis not present

## 2023-03-25 DIAGNOSIS — Z1231 Encounter for screening mammogram for malignant neoplasm of breast: Secondary | ICD-10-CM | POA: Diagnosis not present

## 2023-07-24 ENCOUNTER — Other Ambulatory Visit (HOSPITAL_COMMUNITY): Payer: Self-pay

## 2023-07-24 ENCOUNTER — Other Ambulatory Visit: Payer: Self-pay

## 2023-07-24 ENCOUNTER — Other Ambulatory Visit (HOSPITAL_BASED_OUTPATIENT_CLINIC_OR_DEPARTMENT_OTHER): Payer: Self-pay

## 2023-07-24 MED ORDER — AMLODIPINE BESYLATE 2.5 MG PO TABS
2.5000 mg | ORAL_TABLET | Freq: Every morning | ORAL | 1 refills | Status: DC
  Filled 2023-07-24 – 2023-07-30 (×3): qty 30, 30d supply, fill #0

## 2023-07-24 MED ORDER — NITROFURANTOIN MACROCRYSTAL 50 MG PO CAPS
50.0000 mg | ORAL_CAPSULE | Freq: Every evening | ORAL | 3 refills | Status: DC
  Filled 2023-07-24 – 2023-07-30 (×3): qty 90, 90d supply, fill #0
  Filled 2023-10-09: qty 30, 30d supply, fill #1
  Filled 2023-11-07: qty 30, 30d supply, fill #2
  Filled ????-??-??: fill #2

## 2023-07-24 MED ORDER — MIRTAZAPINE 7.5 MG PO TABS
7.5000 mg | ORAL_TABLET | Freq: Every day | ORAL | 1 refills | Status: DC
  Filled 2023-07-24 – 2023-07-30 (×3): qty 30, 30d supply, fill #0

## 2023-07-24 MED ORDER — IRBESARTAN 150 MG PO TABS
150.0000 mg | ORAL_TABLET | Freq: Every evening | ORAL | 1 refills | Status: DC
  Filled 2023-07-24 – 2023-07-30 (×3): qty 30, 30d supply, fill #0

## 2023-07-24 MED ORDER — ATORVASTATIN CALCIUM 40 MG PO TABS
40.0000 mg | ORAL_TABLET | Freq: Every evening | ORAL | 1 refills | Status: DC
  Filled 2023-07-24 – 2023-07-30 (×3): qty 30, 30d supply, fill #0

## 2023-07-24 MED ORDER — MEMANTINE HCL 10 MG PO TABS
10.0000 mg | ORAL_TABLET | Freq: Two times a day (BID) | ORAL | 0 refills | Status: DC
  Filled 2023-07-24 – 2023-07-30 (×3): qty 60, 30d supply, fill #0

## 2023-07-24 MED ORDER — SOLIFENACIN SUCCINATE 5 MG PO TABS
5.0000 mg | ORAL_TABLET | Freq: Every evening | ORAL | 1 refills | Status: DC
  Filled 2023-07-24 – 2023-07-30 (×3): qty 30, 30d supply, fill #0

## 2023-07-24 MED ORDER — QUETIAPINE FUMARATE 25 MG PO TABS
25.0000 mg | ORAL_TABLET | Freq: Every day | ORAL | 1 refills | Status: DC
  Filled 2023-07-24 – 2023-07-30 (×3): qty 30, 30d supply, fill #0

## 2023-07-30 ENCOUNTER — Encounter: Payer: Self-pay | Admitting: Pharmacist

## 2023-07-30 ENCOUNTER — Other Ambulatory Visit: Payer: Self-pay

## 2023-07-30 ENCOUNTER — Other Ambulatory Visit (HOSPITAL_COMMUNITY): Payer: Self-pay

## 2023-08-03 ENCOUNTER — Other Ambulatory Visit: Payer: Self-pay

## 2023-08-21 ENCOUNTER — Other Ambulatory Visit: Payer: Self-pay

## 2023-08-22 ENCOUNTER — Other Ambulatory Visit (HOSPITAL_COMMUNITY): Payer: Self-pay

## 2023-08-22 MED ORDER — QUETIAPINE FUMARATE 25 MG PO TABS
25.0000 mg | ORAL_TABLET | Freq: Every evening | ORAL | 0 refills | Status: DC | PRN
  Filled 2023-08-22: qty 90, 90d supply, fill #0
  Filled 2023-09-16: qty 30, 30d supply, fill #1
  Filled 2023-10-09: qty 30, 30d supply, fill #2

## 2023-08-22 MED ORDER — MIRTAZAPINE 7.5 MG PO TABS
7.5000 mg | ORAL_TABLET | Freq: Every evening | ORAL | 0 refills | Status: DC | PRN
  Filled 2023-08-22: qty 90, 90d supply, fill #0
  Filled 2023-09-16: qty 30, 30d supply, fill #1
  Filled 2023-10-09: qty 30, 30d supply, fill #2

## 2023-08-22 MED ORDER — IRBESARTAN 150 MG PO TABS
150.0000 mg | ORAL_TABLET | Freq: Every evening | ORAL | 0 refills | Status: DC
  Filled 2023-08-22: qty 90, 90d supply, fill #0
  Filled 2023-09-16: qty 30, 30d supply, fill #1
  Filled 2023-10-09: qty 30, 30d supply, fill #2

## 2023-08-22 MED ORDER — AMLODIPINE BESYLATE 2.5 MG PO TABS
2.5000 mg | ORAL_TABLET | Freq: Every morning | ORAL | 0 refills | Status: DC
  Filled 2023-08-22: qty 90, 90d supply, fill #0
  Filled 2023-09-16: qty 30, 30d supply, fill #1
  Filled 2023-10-09: qty 30, 30d supply, fill #2

## 2023-08-22 MED ORDER — SOLIFENACIN SUCCINATE 5 MG PO TABS
5.0000 mg | ORAL_TABLET | Freq: Every evening | ORAL | 0 refills | Status: DC
  Filled 2023-08-22: qty 90, 90d supply, fill #0
  Filled 2023-09-16: qty 30, 30d supply, fill #1
  Filled 2023-10-09: qty 30, 30d supply, fill #2

## 2023-08-22 MED ORDER — MEMANTINE HCL 10 MG PO TABS
10.0000 mg | ORAL_TABLET | Freq: Two times a day (BID) | ORAL | 0 refills | Status: DC
  Filled 2023-08-22: qty 180, 90d supply, fill #0
  Filled 2023-09-16: qty 60, 30d supply, fill #1
  Filled 2023-10-09: qty 60, 30d supply, fill #2

## 2023-08-22 MED ORDER — ATORVASTATIN CALCIUM 40 MG PO TABS
40.0000 mg | ORAL_TABLET | Freq: Every evening | ORAL | 0 refills | Status: DC
  Filled 2023-08-22: qty 90, 90d supply, fill #0
  Filled 2023-09-16: qty 30, 30d supply, fill #1
  Filled 2023-10-09: qty 30, 30d supply, fill #2

## 2023-08-23 ENCOUNTER — Other Ambulatory Visit (HOSPITAL_COMMUNITY): Payer: Self-pay

## 2023-08-23 ENCOUNTER — Other Ambulatory Visit: Payer: Self-pay

## 2023-08-27 ENCOUNTER — Other Ambulatory Visit (HOSPITAL_COMMUNITY): Payer: Self-pay

## 2023-08-28 DIAGNOSIS — M810 Age-related osteoporosis without current pathological fracture: Secondary | ICD-10-CM | POA: Diagnosis not present

## 2023-09-16 ENCOUNTER — Other Ambulatory Visit (HOSPITAL_COMMUNITY): Payer: Self-pay

## 2023-09-16 ENCOUNTER — Other Ambulatory Visit: Payer: Self-pay

## 2023-10-01 DIAGNOSIS — L309 Dermatitis, unspecified: Secondary | ICD-10-CM | POA: Diagnosis not present

## 2023-10-01 DIAGNOSIS — E7800 Pure hypercholesterolemia, unspecified: Secondary | ICD-10-CM | POA: Diagnosis not present

## 2023-10-01 DIAGNOSIS — G301 Alzheimer's disease with late onset: Secondary | ICD-10-CM | POA: Diagnosis not present

## 2023-10-01 DIAGNOSIS — I10 Essential (primary) hypertension: Secondary | ICD-10-CM | POA: Diagnosis not present

## 2023-10-07 DIAGNOSIS — M8588 Other specified disorders of bone density and structure, other site: Secondary | ICD-10-CM | POA: Diagnosis not present

## 2023-10-09 ENCOUNTER — Other Ambulatory Visit (HOSPITAL_COMMUNITY): Payer: Self-pay

## 2023-10-09 ENCOUNTER — Other Ambulatory Visit: Payer: Self-pay

## 2023-10-10 ENCOUNTER — Other Ambulatory Visit: Payer: Self-pay

## 2023-10-15 ENCOUNTER — Other Ambulatory Visit: Payer: Self-pay

## 2023-10-22 ENCOUNTER — Other Ambulatory Visit: Payer: Self-pay

## 2023-11-06 ENCOUNTER — Other Ambulatory Visit (HOSPITAL_COMMUNITY): Payer: Self-pay

## 2023-11-06 ENCOUNTER — Other Ambulatory Visit: Payer: Self-pay

## 2023-11-06 MED ORDER — MEMANTINE HCL 10 MG PO TABS
10.0000 mg | ORAL_TABLET | Freq: Two times a day (BID) | ORAL | 1 refills | Status: DC
  Filled 2023-11-06 – 2023-11-07 (×2): qty 180, 90d supply, fill #0

## 2023-11-06 MED ORDER — AMLODIPINE BESYLATE 2.5 MG PO TABS
2.5000 mg | ORAL_TABLET | Freq: Every morning | ORAL | 1 refills | Status: DC
  Filled 2023-11-06 – 2023-11-07 (×2): qty 90, 90d supply, fill #0

## 2023-11-06 MED ORDER — IRBESARTAN 150 MG PO TABS
150.0000 mg | ORAL_TABLET | Freq: Every evening | ORAL | 1 refills | Status: DC
  Filled 2023-11-06 – 2023-11-07 (×2): qty 90, 90d supply, fill #0

## 2023-11-06 MED ORDER — ATORVASTATIN CALCIUM 40 MG PO TABS
40.0000 mg | ORAL_TABLET | Freq: Every evening | ORAL | 3 refills | Status: DC
  Filled 2023-11-06 – 2023-11-07 (×2): qty 90, 90d supply, fill #0

## 2023-11-06 MED ORDER — QUETIAPINE FUMARATE 25 MG PO TABS
25.0000 mg | ORAL_TABLET | Freq: Every evening | ORAL | 1 refills | Status: DC | PRN
  Filled 2023-11-06 – 2023-11-07 (×2): qty 90, 90d supply, fill #0

## 2023-11-06 MED ORDER — MIRTAZAPINE 7.5 MG PO TABS
7.5000 mg | ORAL_TABLET | Freq: Every evening | ORAL | 1 refills | Status: DC | PRN
  Filled 2023-11-06 – 2023-11-07 (×2): qty 90, 90d supply, fill #0

## 2023-11-07 ENCOUNTER — Other Ambulatory Visit: Payer: Self-pay

## 2023-11-22 DIAGNOSIS — L98499 Non-pressure chronic ulcer of skin of other sites with unspecified severity: Secondary | ICD-10-CM | POA: Diagnosis not present

## 2023-11-26 ENCOUNTER — Emergency Department (HOSPITAL_COMMUNITY)
Admission: EM | Admit: 2023-11-26 | Discharge: 2023-11-27 | Disposition: A | Discharge status: Home / Self Care | Payer: Medicare Other | Attending: Emergency Medicine | Admitting: Emergency Medicine

## 2023-11-26 ENCOUNTER — Other Ambulatory Visit: Payer: Self-pay

## 2023-11-26 ENCOUNTER — Encounter (HOSPITAL_COMMUNITY): Payer: Self-pay

## 2023-11-26 DIAGNOSIS — Z79899 Other long term (current) drug therapy: Secondary | ICD-10-CM | POA: Insufficient documentation

## 2023-11-26 DIAGNOSIS — L89159 Pressure ulcer of sacral region, unspecified stage: Secondary | ICD-10-CM | POA: Diagnosis not present

## 2023-11-26 DIAGNOSIS — I10 Essential (primary) hypertension: Secondary | ICD-10-CM | POA: Insufficient documentation

## 2023-11-26 DIAGNOSIS — M549 Dorsalgia, unspecified: Secondary | ICD-10-CM | POA: Diagnosis present

## 2023-11-26 DIAGNOSIS — Z743 Need for continuous supervision: Secondary | ICD-10-CM | POA: Diagnosis not present

## 2023-11-26 DIAGNOSIS — F0390 Unspecified dementia without behavioral disturbance: Secondary | ICD-10-CM | POA: Diagnosis not present

## 2023-11-26 DIAGNOSIS — D259 Leiomyoma of uterus, unspecified: Secondary | ICD-10-CM | POA: Diagnosis not present

## 2023-11-26 DIAGNOSIS — Z7982 Long term (current) use of aspirin: Secondary | ICD-10-CM | POA: Diagnosis not present

## 2023-11-26 DIAGNOSIS — L98421 Non-pressure chronic ulcer of back limited to breakdown of skin: Secondary | ICD-10-CM | POA: Diagnosis not present

## 2023-11-26 DIAGNOSIS — L8995 Pressure ulcer of unspecified site, unstageable: Secondary | ICD-10-CM | POA: Diagnosis not present

## 2023-11-26 DIAGNOSIS — R6889 Other general symptoms and signs: Secondary | ICD-10-CM | POA: Diagnosis not present

## 2023-11-26 NOTE — ED Triage Notes (Addendum)
 Pt BIB EMS from home with pressure ulcers on her sacrum and bottom x 4 months. Family has been putting cream on it and has been asking her primary for wound care but it has not been set up.  Pt has dementia.

## 2023-11-26 NOTE — ED Provider Triage Note (Signed)
 Emergency Medicine Provider Triage Evaluation Note  [name] , a 82 y.o. female  was evaluated in triage.  Pt complains of pressure ulcer on her sacrum and bottom for the past 4 months.  Family has been putting cream on it and has been asking her primary for wound care, but she has not been able to get this set up outpatient.  Patient does have dementia.  She lives with family.  Reported to be ambulatory with walker.   Review of Systems  Positive: As above Negative: As above  Physical Exam  Ht 5' 2 (1.575 m)   Wt 68 kg   LMP  (LMP Unknown)   BMI 27.42 kg/m  Gen:   Awake, no distress   Resp:  Normal effort  MSK:   Moves extremities without difficulty  Other:    Medical Decision Making  Medically screening exam initiated at 6:07 PM.  Appropriate orders placed.  SHANDRICKA MONROY was informed that the remainder of the evaluation will be completed by another provider, this initial triage assessment does not replace that evaluation, and the importance of remaining in the ED until their evaluation is complete.     Gretta Gerard [place], [place] 11/26/23 [phone]

## 2023-11-27 ENCOUNTER — Emergency Department (HOSPITAL_COMMUNITY): Payer: Medicare Other

## 2023-11-27 LAB — CBC WITH DIFFERENTIAL/PLATELET
Abs Immature Granulocytes: 0.04 10*3/uL (ref 0.00–0.07)
Basophils Absolute: 0.1 10*3/uL (ref 0.0–0.1)
Basophils Relative: 1 %
Eosinophils Absolute: 0.1 10*3/uL (ref 0.0–0.5)
Eosinophils Relative: 1 %
HCT: 46.7 % — ABNORMAL HIGH (ref 36.0–46.0)
Hemoglobin: 14.7 g/dL (ref 12.0–15.0)
Immature Granulocytes: 0 %
Lymphocytes Relative: 14 %
Lymphs Abs: 1.4 10*3/uL (ref 0.7–4.0)
MCH: 27.5 pg (ref 26.0–34.0)
MCHC: 31.5 g/dL (ref 30.0–36.0)
MCV: 87.5 fL (ref 80.0–100.0)
Monocytes Absolute: 0.7 10*3/uL (ref 0.1–1.0)
Monocytes Relative: 7 %
Neutro Abs: 7.8 10*3/uL — ABNORMAL HIGH (ref 1.7–7.7)
Neutrophils Relative %: 77 %
Platelets: 184 10*3/uL (ref 150–400)
RBC: 5.34 MIL/uL — ABNORMAL HIGH (ref 3.87–5.11)
RDW: 15.5 % (ref 11.5–15.5)
WBC: 10 10*3/uL (ref 4.0–10.5)
nRBC: 0 % (ref 0.0–0.2)

## 2023-11-27 LAB — BASIC METABOLIC PANEL
Anion gap: 8 (ref 5–15)
BUN: 11 mg/dL (ref 8–23)
CO2: 24 mmol/L (ref 22–32)
Calcium: 9.4 mg/dL (ref 8.9–10.3)
Chloride: 110 mmol/L (ref 98–111)
Creatinine, Ser: 0.64 mg/dL (ref 0.44–1.00)
GFR, Estimated: >60 mL/min (ref >60)
Glucose, Bld: 102 mg/dL — ABNORMAL HIGH (ref 70–99)
Potassium: 3.9 mmol/L (ref 3.5–5.1)
Sodium: 142 mmol/L (ref 135–145)

## 2023-11-27 MED ORDER — CEPHALEXIN 500 MG PO CAPS: 500.0000 mg | ORAL_CAPSULE | Freq: Four times a day (QID) | ORAL | 0 refills | Status: DC

## 2023-11-27 NOTE — Discharge Instructions (Addendum)
 Please check wound regularly and return to emergency department worsening symptoms such as fever, spreading redness, or fluctuance.

## 2023-11-27 NOTE — ED Notes (Signed)
 Vitals deferred, pt's family requested that pt not be woken up at this time.

## 2023-11-27 NOTE — ED Provider Notes (Signed)
 [place] [facility] AT [facility] Provider Note   CSN: [id number] Arrival date & time: 11/26/23  1756     History  Chief Complaint  Patient presents with   Wound Check    [name] is a 83 y.o. female.  HPI 83 year old female history of hypertension, dementia, presents today from home with her son who is caregiver with complaints of pressure ulcer and pain.  Son states that she does not get up as much as she used to does been a lot of time sitting and laying down.  She has a wound on her sacral area that has been present for several months.  However, she has begun having more pain and discharge from it over the past few days.  He presented today due to pain.  He denies fever, chills, change in activity, change in appetite urine or stool output.    Home Medications Prior to Admission medications   Medication Sig Start Date End Date Taking? Authorizing Provider  cephALEXin  (KEFLEX ) 500 MG capsule Take 1 capsule (500 mg total) by mouth 4 (four) times daily. 11/27/23  Yes Levander Houston, MD  acetaminophen  (TYLENOL ) 500 MG tablet Take 1,000 mg by mouth every 6 (six) hours as needed for fever.    [provider]  amLODipine  (NORVASC ) 10 MG tablet Take 10 mg by mouth daily.    [provider]  amLODipine  (NORVASC ) 2.5 MG tablet Take 1 tablet (2.5 mg total) by mouth in the morning. 11/06/23     aspirin  81 MG tablet Take 162 mg by mouth daily.     [provider]  atorvastatin  (LIPITOR) 40 MG tablet Take 40 mg by mouth daily.    [provider]  atorvastatin  (LIPITOR) 40 MG tablet Take 1 tablet (40 mg total) by mouth every evening. 11/06/23     calcium -vitamin D (OSCAL) 250-125 MG-UNIT per tablet Take 1 tablet by mouth daily.    [provider]  Chlorphen-Pseudoephed-APAP (CORICIDIN D PO) Take 1 tablet by mouth daily as needed (allergies).    [provider]  ibuprofen (ADVIL,MOTRIN) 200 MG tablet Take 200 mg by  mouth every 6 (six) hours as needed for moderate pain.     [provider]  irbesartan  (AVAPRO ) 150 MG tablet Take 150 mg by mouth at bedtime.    [provider]  irbesartan  (AVAPRO ) 150 MG tablet Take 1 tablet (150 mg total) by mouth every evening. 11/06/23     memantine  (NAMENDA ) 10 MG tablet Take 1 tablet (10 mg total) by mouth 2 (two) times daily. 11/06/23     memantine  (NAMENDA ) 5 MG tablet Take 5 mg by mouth 2 (two) times daily.    [provider]  mirtazapine  (REMERON ) 7.5 MG tablet Take 1 tablet (7.5 mg total) by mouth at bedtime as needed for sleep 11/06/23     Multiple Vitamin (MULTIVITAMIN) tablet Take 1 tablet by mouth daily.    [provider]  nitrofurantoin  (MACRODANTIN ) 50 MG capsule Take 1 capsule (50 mg total) by mouth every evening for urinary tract infection prevention. 03/04/23   Arloa Elsie SAUNDERS, MD  QUEtiapine  (SEROQUEL ) 25 MG tablet Take 1 tablet (25 mg total) by mouth at bedtime as needed for sleep/confusion. 11/06/23     solifenacin  (VESICARE ) 5 MG tablet Take 5 mg by mouth daily.    [provider]  solifenacin  (VESICARE ) 5 MG tablet Take 1 tablet (5 mg total) by mouth every evening. 08/22/23  Allergies    Aspirin     Review of Systems   Review of Systems  Physical Exam Updated Vital Signs BP 133/88   Pulse 64   Temp 98.7 F (37.1 C) (Oral)   Resp 18   Ht 1.575 m (5' 2) Comment: Simultaneous filing. User may not have seen previous data.  Wt 68 kg Comment: Simultaneous filing. User may not have seen previous data.  LMP  (LMP Unknown)   SpO2 97%   BMI 27.44 kg/m  Physical Exam Vitals reviewed.  HENT:     Head: Normocephalic.     Right Ear: External ear normal.     Left Ear: External ear normal.     Nose: Nose normal.     Mouth/Throat:     Pharynx: Oropharynx is clear.  Eyes:     Pupils: Pupils are equal, round, and reactive to light.  Cardiovascular:     Rate and Rhythm: Normal rate and regular  rhythm.     Pulses: Normal pulses.  Pulmonary:     Effort: Pulmonary effort is normal.     Breath sounds: Normal breath sounds.  Abdominal:     General: Abdomen is flat. Bowel sounds are normal.     Palpations: Abdomen is soft.  Genitourinary:    Comments: Sacral wound with skin denuded which is tender to palpation no obvious fluctuance or discharge noted Musculoskeletal:        General: Normal range of motion.     Cervical back: Normal range of motion.  Skin:    General: Skin is warm and dry.     Capillary Refill: Capillary refill takes less than 2 seconds.  Neurological:     General: No focal deficit present.     Mental Status: She is alert.     Comments: Patient oriented to person but not place or time No focal deficits are noted     ED Results / Procedures / Treatments   Labs (all labs ordered are listed, but only abnormal results are displayed) Labs Reviewed  CBC WITH DIFFERENTIAL/PLATELET - Abnormal; Notable for the following components:      Result Value   RBC 5.34 (*)    HCT 46.7 (*)    Neutro Abs 7.8 (*)    All other components within normal limits  BASIC METABOLIC PANEL - Abnormal; Notable for the following components:   Glucose, Bld 102 (*)    All other components within normal limits    EKG None  Radiology CT PELVIS WO CONTRAST Result Date: 11/27/2023 CLINICAL DATA:  Perianal abscess or fistula possible. History of sacral pressure ulcers. EXAM: CT PELVIS WITHOUT CONTRAST TECHNIQUE: Multidetector CT imaging of the pelvis was performed following the standard protocol without intravenous contrast. RADIATION DOSE REDUCTION: This exam was performed according to the departmental dose-optimization program which includes automated exposure control, adjustment of the mA and/or kV according to patient size and/or use of iterative reconstruction technique. COMPARISON:  09/13/2022 FINDINGS: Urinary Tract:  No abnormality visualized. Bowel:  Visualized small bowel, colon and  appendix are normal. Vascular/Lymphatic: Calcified plaque over the abdominal aorta which is normal in caliber. Remaining vascular structures are unremarkable. No adenopathy. Reproductive: Calcified uterine fibroids present. Adnexal regions are unremarkable. Other: No free fluid or focal inflammatory change in the abdomen/pelvis. No evidence of perianal/perirectal abscess. Musculoskeletal: Mild increased density in the subcutaneous fat over the midline coccygeal region slightly worse. No air in the soft tissues. No definite subcutaneous abscess. No definite underlying bone destruction to suggest osteomyelitis.  Subtle stranding of the subcutaneous fat over the left inferior gluteal fold unchanged. Minimal degenerate changes of the spine and hips. IMPRESSION: 1. Mild increased density in the subcutaneous fat over the midline coccygeal region slightly worse. No air in the soft tissues. No definite subcutaneous abscess. No definite underlying bone destruction to suggest osteomyelitis. 2. No evidence of perianal/perirectal abscess. 3. Subtle stranding of the subcutaneous fat over the left inferior gluteal fold unchanged. 4. Calcified uterine fibroids. 5. Aortic atherosclerosis. Aortic Atherosclerosis (ICD10-I70.0). Electronically Signed   By: Toribio Agreste M.D.   On: 11/27/2023 08:50    Procedures Procedures    Medications Ordered in ED Medications - No data to display  ED Course/ Medical Decision Making/ A&P                                 Medical Decision Making Amount and/or Complexity of Data Reviewed Radiology: ordered.   Patient with wound to sacral area.  Differential diagnosis includes but is not limited to abscess, cellulitis, necrotizing fasciitis.  Patient evaluated with labs and imaging.  CBC without evidence of leukocytosis.  Basic metabolic panel within normal limits CT of pelvis shows mild increased density in the subcutaneous fat of her midline coccygeal reason and slightly worse.  No  some air in the soft tissue no definite subcutaneous abscess. Patient hemodynamically stable here and afebrile.  She appears to be able to take p.o.  Plan Keflex .  Discussed return precautions, need for follow-up, and comfort measures with son.  He voices understanding.       Final Clinical Impression(s) / ED Diagnoses Final diagnoses:  Skin ulcer of sacrum, limited to breakdown of skin (HCC)    Rx / DC Orders ED Discharge Orders          Ordered    cephALEXin  (KEFLEX ) 500 MG capsule  4 times daily        11/27/23 0907              Levander Houston, MD 11/27/23 [phone]

## 2023-11-28 DIAGNOSIS — L98421 Non-pressure chronic ulcer of back limited to breakdown of skin: Secondary | ICD-10-CM | POA: Diagnosis not present

## 2023-12-02 ENCOUNTER — Other Ambulatory Visit (HOSPITAL_COMMUNITY): Payer: Self-pay

## 2023-12-03 ENCOUNTER — Other Ambulatory Visit (HOSPITAL_COMMUNITY): Payer: Self-pay

## 2023-12-03 MED ORDER — SOLIFENACIN SUCCINATE 5 MG PO TABS
5.0000 mg | ORAL_TABLET | Freq: Every evening | ORAL | 1 refills | Status: DC
  Filled 2023-12-03: qty 90, 90d supply, fill #0
  Filled 2023-12-04: qty 53, 53d supply, fill #0
  Filled 2023-12-04 (×2): qty 90, 90d supply, fill #0
  Filled 2023-12-04: qty 37, 37d supply, fill #0

## 2023-12-04 ENCOUNTER — Other Ambulatory Visit: Payer: Self-pay

## 2023-12-04 ENCOUNTER — Other Ambulatory Visit (HOSPITAL_COMMUNITY): Payer: Self-pay

## 2023-12-04 DIAGNOSIS — I10 Essential (primary) hypertension: Secondary | ICD-10-CM | POA: Diagnosis not present

## 2023-12-04 DIAGNOSIS — M810 Age-related osteoporosis without current pathological fracture: Secondary | ICD-10-CM | POA: Diagnosis not present

## 2023-12-04 DIAGNOSIS — L89322 Pressure ulcer of left buttock, stage 2: Secondary | ICD-10-CM | POA: Diagnosis not present

## 2023-12-04 DIAGNOSIS — K5900 Constipation, unspecified: Secondary | ICD-10-CM | POA: Diagnosis not present

## 2023-12-04 DIAGNOSIS — I2510 Atherosclerotic heart disease of native coronary artery without angina pectoris: Secondary | ICD-10-CM | POA: Diagnosis not present

## 2023-12-04 DIAGNOSIS — F5101 Primary insomnia: Secondary | ICD-10-CM | POA: Diagnosis not present

## 2023-12-04 DIAGNOSIS — G301 Alzheimer's disease with late onset: Secondary | ICD-10-CM | POA: Diagnosis not present

## 2023-12-04 DIAGNOSIS — I700 Atherosclerosis of aorta: Secondary | ICD-10-CM | POA: Diagnosis not present

## 2023-12-13 DIAGNOSIS — I700 Atherosclerosis of aorta: Secondary | ICD-10-CM | POA: Diagnosis not present

## 2023-12-13 DIAGNOSIS — G301 Alzheimer's disease with late onset: Secondary | ICD-10-CM | POA: Diagnosis not present

## 2023-12-13 DIAGNOSIS — L89322 Pressure ulcer of left buttock, stage 2: Secondary | ICD-10-CM | POA: Diagnosis not present

## 2023-12-15 ENCOUNTER — Inpatient Hospital Stay (HOSPITAL_COMMUNITY)
Admission: EM | Admit: 2023-12-15 | Discharge: 2023-12-18 | DRG: 264 | Disposition: A | Discharge status: Home Health | Payer: Medicare Other | Attending: Surgery | Admitting: Surgery

## 2023-12-15 ENCOUNTER — Encounter (HOSPITAL_COMMUNITY): Payer: Self-pay

## 2023-12-15 ENCOUNTER — Other Ambulatory Visit: Payer: Self-pay

## 2023-12-15 DIAGNOSIS — L89300 Pressure ulcer of unspecified buttock, unstageable: Secondary | ICD-10-CM | POA: Diagnosis not present

## 2023-12-15 DIAGNOSIS — E7800 Pure hypercholesterolemia, unspecified: Secondary | ICD-10-CM | POA: Diagnosis not present

## 2023-12-15 DIAGNOSIS — L89329 Pressure ulcer of left buttock, unspecified stage: Secondary | ICD-10-CM | POA: Diagnosis present

## 2023-12-15 DIAGNOSIS — Z79899 Other long term (current) drug therapy: Secondary | ICD-10-CM | POA: Diagnosis not present

## 2023-12-15 DIAGNOSIS — L89309 Pressure ulcer of unspecified buttock, unspecified stage: Secondary | ICD-10-CM | POA: Diagnosis present

## 2023-12-15 DIAGNOSIS — Z886 Allergy status to analgesic agent status: Secondary | ICD-10-CM

## 2023-12-15 DIAGNOSIS — Z833 Family history of diabetes mellitus: Secondary | ICD-10-CM

## 2023-12-15 DIAGNOSIS — Z8249 Family history of ischemic heart disease and other diseases of the circulatory system: Secondary | ICD-10-CM

## 2023-12-15 DIAGNOSIS — Z7982 Long term (current) use of aspirin: Secondary | ICD-10-CM

## 2023-12-15 DIAGNOSIS — I96 Gangrene, not elsewhere classified: Secondary | ICD-10-CM | POA: Diagnosis not present

## 2023-12-15 DIAGNOSIS — F0390 Unspecified dementia without behavioral disturbance: Secondary | ICD-10-CM | POA: Diagnosis present

## 2023-12-15 DIAGNOSIS — G4700 Insomnia, unspecified: Secondary | ICD-10-CM | POA: Diagnosis present

## 2023-12-15 DIAGNOSIS — I10 Essential (primary) hypertension: Secondary | ICD-10-CM | POA: Diagnosis present

## 2023-12-15 DIAGNOSIS — L98422 Non-pressure chronic ulcer of back with fat layer exposed: Principal | ICD-10-CM

## 2023-12-15 DIAGNOSIS — Z820 Family history of epilepsy and other diseases of the nervous system: Secondary | ICD-10-CM

## 2023-12-15 DIAGNOSIS — E785 Hyperlipidemia, unspecified: Secondary | ICD-10-CM | POA: Diagnosis not present

## 2023-12-15 DIAGNOSIS — L89159 Pressure ulcer of sacral region, unspecified stage: Secondary | ICD-10-CM | POA: Diagnosis not present

## 2023-12-15 LAB — BASIC METABOLIC PANEL
Anion gap: 12 (ref 5–15)
BUN: 9 mg/dL (ref 8–23)
CO2: 22 mmol/L (ref 22–32)
Calcium: 9.2 mg/dL (ref 8.9–10.3)
Chloride: 106 mmol/L (ref 98–111)
Creatinine, Ser: 0.71 mg/dL (ref 0.44–1.00)
GFR, Estimated: >60 mL/min (ref >60)
Glucose, Bld: 127 mg/dL — ABNORMAL HIGH (ref 70–99)
Potassium: 4 mmol/L (ref 3.5–5.1)
Sodium: 140 mmol/L (ref 135–145)

## 2023-12-15 LAB — CBC WITH DIFFERENTIAL/PLATELET
Abs Immature Granulocytes: 0.08 10*3/uL — ABNORMAL HIGH (ref 0.00–0.07)
Basophils Absolute: 0.1 10*3/uL (ref 0.0–0.1)
Basophils Relative: 1 %
Eosinophils Absolute: 0.2 10*3/uL (ref 0.0–0.5)
Eosinophils Relative: 1 %
HCT: 43 % (ref 36.0–46.0)
Hemoglobin: 13.6 g/dL (ref 12.0–15.0)
Immature Granulocytes: 1 %
Lymphocytes Relative: 11 %
Lymphs Abs: 1.2 10*3/uL (ref 0.7–4.0)
MCH: 27.9 pg (ref 26.0–34.0)
MCHC: 31.6 g/dL (ref 30.0–36.0)
MCV: 88.1 fL (ref 80.0–100.0)
Monocytes Absolute: 0.6 10*3/uL (ref 0.1–1.0)
Monocytes Relative: 6 %
Neutro Abs: 9.2 10*3/uL — ABNORMAL HIGH (ref 1.7–7.7)
Neutrophils Relative %: 80 %
Platelets: 236 10*3/uL (ref 150–400)
RBC: 4.88 MIL/uL (ref 3.87–5.11)
RDW: 15.2 % (ref 11.5–15.5)
WBC: 11.4 10*3/uL — ABNORMAL HIGH (ref 4.0–10.5)
nRBC: 0 % (ref 0.0–0.2)

## 2023-12-15 LAB — I-STAT CG4 LACTIC ACID, ED: Lactic Acid, Venous: 1.7 mmol/L (ref 0.5–1.9)

## 2023-12-15 MED ORDER — ONDANSETRON HCL 4 MG PO TABS: 4.0000 mg | ORAL_TABLET | Freq: Four times a day (QID) | ORAL | Status: DC | PRN

## 2023-12-15 MED ORDER — ACETAMINOPHEN 325 MG PO TABS
650.0000 mg | ORAL_TABLET | Freq: Four times a day (QID) | ORAL | Status: DC | PRN
  Administered 2023-12-17: 650 mg via ORAL
  Filled 2023-12-15: qty 2

## 2023-12-15 MED ORDER — METRONIDAZOLE 500 MG/100ML IV SOLN
500.0000 mg | Freq: Two times a day (BID) | INTRAVENOUS | Status: DC
  Administered 2023-12-16 – 2023-12-18 (×5): 500 mg via INTRAVENOUS
  Filled 2023-12-15 (×5): qty 100

## 2023-12-15 MED ORDER — ACETAMINOPHEN 650 MG RE SUPP: 650.0000 mg | Freq: Four times a day (QID) | RECTAL | Status: DC | PRN

## 2023-12-15 MED ORDER — METRONIDAZOLE 500 MG/100ML IV SOLN
500.0000 mg | Freq: Once | INTRAVENOUS | Status: AC
  Administered 2023-12-15: 500 mg via INTRAVENOUS
  Filled 2023-12-15: qty 100

## 2023-12-15 MED ORDER — QUETIAPINE FUMARATE 25 MG PO TABS: 25.0000 mg | ORAL_TABLET | Freq: Every day | ORAL | Status: DC

## 2023-12-15 MED ORDER — ENOXAPARIN SODIUM 40 MG/0.4ML IJ SOSY: 40.0000 mg | PREFILLED_SYRINGE | INTRAMUSCULAR | Status: DC

## 2023-12-15 MED ORDER — QUETIAPINE FUMARATE 25 MG PO TABS
25.0000 mg | ORAL_TABLET | ORAL | Status: AC
  Administered 2023-12-15: 25 mg via ORAL
  Filled 2023-12-15: qty 1

## 2023-12-15 MED ORDER — ONDANSETRON HCL 4 MG/2ML IJ SOLN: 4.0000 mg | Freq: Four times a day (QID) | INTRAMUSCULAR | Status: DC | PRN

## 2023-12-15 MED ORDER — SODIUM CHLORIDE 0.9 % IV SOLN
2.0000 g | Freq: Once | INTRAVENOUS | Status: AC
  Administered 2023-12-15: 2 g via INTRAVENOUS
  Filled 2023-12-15: qty 12.5

## 2023-12-15 MED ORDER — SODIUM CHLORIDE 0.9 % IV SOLN
1.0000 g | INTRAVENOUS | Status: DC
  Administered 2023-12-16 – 2023-12-18 (×3): 1 g via INTRAVENOUS
  Filled 2023-12-15 (×3): qty 10

## 2023-12-15 NOTE — ED Notes (Signed)
Pt difficult stick, NT was unable to collect 2nd set of Blood cultures. Dr. Adela Lank informed and will go ahead and administered antibiotics at this time.

## 2023-12-15 NOTE — H&P (Signed)
History and Physical    Patient: [name] ZOX:[id number] DOB: Sep 06, 1941 DOA: 12/15/2023 DOS: the patient was seen and examined on 12/15/2023 PCP: Noberto Retort, MD  Patient coming from: Home  Chief Complaint:  Chief Complaint  Patient presents with   Wound Check   HPI: [name] is a 83 y.o. female with medical history significant of HTN, HLD.  Pt with ischeal / buttock decubitus ulcer.  Pt currently on wait list at [facility] wound [facility] clinics.  Unfortunately wait list care alone not working well for wound and it is getting worse.  Now with purlent discharge and overlying eschar.   Review of Systems: As mentioned in the history of present illness. All other systems reviewed and are negative. Past Medical History:  Diagnosis Date   Heart murmur    Hyperlipidemia    Hypertension    Osteoporosis    Seasonal allergies    Past Surgical History:  Procedure Laterality Date   CATARACT EXTRACTION     FRACTURE SURGERY     ORIF ANKLE FRACTURE     TUBAL LIGATION     WRIST SURGERY     Social History:  reports that she has never smoked. She has never used smokeless tobacco. She reports that she does not drink alcohol and does not use drugs.  Allergies  Allergen Reactions   Aspirin Nausea And Vomiting    High doses    Family History  Problem Relation Age of Onset   Diabetes Mother    CAD Father    Diabetes Sister    Diabetes Brother    Diabetes Sister    Diabetes Sister    Cancer - Colon Brother    Cancer Brother    Alzheimer's disease Brother     Prior to Admission medications   Medication Sig Start Date End Date Taking? Authorizing Provider  acetaminophen (TYLENOL) 500 MG tablet Take 1,000 mg by mouth every 6 (six) hours as needed for fever.    [provider]  amLODipine (NORVASC) 10 MG tablet Take 10 mg by mouth daily.    [provider]  amLODipine (NORVASC) 2.5 MG tablet Take 1 tablet (2.5 mg total) by mouth in the morning.  11/06/23     aspirin 81 MG tablet Take 162 mg by mouth daily.     [provider]  atorvastatin (LIPITOR) 40 MG tablet Take 40 mg by mouth daily.    [provider]  atorvastatin (LIPITOR) 40 MG tablet Take 1 tablet (40 mg total) by mouth every evening. 11/06/23     calcium-vitamin D (OSCAL) 250-125 MG-UNIT per tablet Take 1 tablet by mouth daily.    [provider]  cephALEXin (KEFLEX) 500 MG capsule Take 1 capsule (500 mg total) by mouth 4 (four) times daily. 11/27/23   Margarita Grizzle, MD  Chlorphen-Pseudoephed-APAP (CORICIDIN D PO) Take 1 tablet by mouth daily as needed (allergies).    [provider]  ibuprofen (ADVIL,MOTRIN) 200 MG tablet Take 200 mg by mouth every 6 (six) hours as needed for moderate pain.     [provider]  irbesartan (AVAPRO) 150 MG tablet Take 150 mg by mouth at bedtime.    [provider]  irbesartan (AVAPRO) 150 MG tablet Take 1 tablet (150 mg total) by mouth every evening. 11/06/23     memantine (NAMENDA) 10 MG tablet Take 1 tablet (10 mg total) by mouth 2 (two) times daily. 11/06/23     memantine (NAMENDA) 5 MG  tablet Take 5 mg by mouth 2 (two) times daily.    [provider]  mirtazapine (REMERON) 7.5 MG tablet Take 1 tablet (7.5 mg total) by mouth at bedtime as needed for sleep 11/06/23     Multiple Vitamin (MULTIVITAMIN) tablet Take 1 tablet by mouth daily.    [provider]  nitrofurantoin (MACRODANTIN) 50 MG capsule Take 1 capsule (50 mg total) by mouth every evening for urinary tract infection prevention. 03/04/23   Noberto Retort, MD  QUEtiapine (SEROQUEL) 25 MG tablet Take 1 tablet (25 mg total) by mouth at bedtime as needed for sleep/confusion. 11/06/23     solifenacin (VESICARE) 5 MG tablet Take 5 mg by mouth daily.    [provider]  solifenacin (VESICARE) 5 MG tablet Take 1 tablet (5 mg total) by mouth every evening. 12/03/23       Physical Exam: Vitals:   12/15/23 1426  12/15/23 1431 12/15/23 1900 12/15/23 1905  BP: 117/73  (!) 132/51   Pulse: 77  94   Resp: 16  17   Temp: 97.6 F (36.4 C)   97.7 F (36.5 C)  SpO2: 94%  98%   Weight:  66.7 kg    Height:  5\' 2"  (1.575 m)     Constitutional: NAD, calm, comfortable Respiratory: clear to auscultation bilaterally, no wheezing, no crackles. Normal respiratory effort. No accessory muscle use.  Cardiovascular: Regular rate and rhythm, no murmurs / rubs / gallops. No extremity edema. 2+ pedal pulses. No carotid bruits.  Abdomen: no tenderness, no masses palpated. No hepatosplenomegaly. Bowel sounds positive.  Skin:     Neurologic: CN 2-12 grossly intact. Sensation intact, DTR normal. Strength 5/5 in all 4.  Psychiatric: Normal judgment and insight. Alert and oriented x 3. Normal mood.   Data Reviewed:    Labs on Admission: I have personally reviewed following labs and imaging studies  CBC: Recent Labs  Lab 12/15/23 1844  WBC 11.4*  NEUTROABS 9.2*  HGB 13.6  HCT 43.0  MCV 88.1  PLT 236   Basic Metabolic Panel: Recent Labs  Lab 12/15/23 1844  NA 140  K 4.0  CL 106  CO2 22  GLUCOSE 127*  BUN 9  CREATININE 0.71  CALCIUM 9.2   GFR: Estimated Creatinine Clearance: 48.5 mL/min (by C-G formula based on SCr of 0.71 mg/dL). Liver Function Tests: No results for input(s): "AST", "ALT", "ALKPHOS", "BILITOT", "PROT", "ALBUMIN" in the last 168 hours. No results for input(s): "LIPASE", "AMYLASE" in the last 168 hours. No results for input(s): "AMMONIA" in the last 168 hours. Coagulation Profile: No results for input(s): "INR", "PROTIME" in the last 168 hours. Cardiac Enzymes: No results for input(s): "CKTOTAL", "CKMB", "CKMBINDEX", "TROPONINI" in the last 168 hours. BNP (last 3 results) No results for input(s): "PROBNP" in the last 8760 hours. HbA1C: No results for input(s): "HGBA1C" in the last 72 hours. CBG: No results for input(s): "GLUCAP" in the last 168 hours. Lipid Profile: No  results for input(s): "CHOL", "HDL", "LDLCALC", "TRIG", "CHOLHDL", "LDLDIRECT" in the last 72 hours. Thyroid Function Tests: No results for input(s): "TSH", "T4TOTAL", "FREET4", "T3FREE", "THYROIDAB" in the last 72 hours. Anemia Panel: No results for input(s): "VITAMINB12", "FOLATE", "FERRITIN", "TIBC", "IRON", "RETICCTPCT" in the last 72 hours. Urine analysis:    Component Value Date/Time   COLORURINE AMBER (A) 07/30/2017 1406   APPEARANCEUR TURBID (A) 07/30/2017 1406   LABSPEC 1.015 09/15/2021 1053   PHURINE 5.5 09/15/2021 1053   GLUCOSEU NEGATIVE 09/15/2021 1053   HGBUR  SMALL (A) 09/15/2021 1053   BILIRUBINUR NEGATIVE 09/15/2021 1053   KETONESUR NEGATIVE 09/15/2021 1053   PROTEINUR NEGATIVE 09/15/2021 1053   UROBILINOGEN 0.2 09/15/2021 1053   NITRITE POSITIVE (A) 09/15/2021 1053   LEUKOCYTESUR TRACE (A) 09/15/2021 1053    Radiological Exams on Admission: No results found.  EKG: Independently reviewed.   Assessment and Plan: * Decubitus ulcer of buttock Rocephin / flagyl Wound care consult Gen surg consult: NPO after MN Suspect pt will need debridement of overlying eschar.  HYPERTENSION, BENIGN SYSTEMIC Cont home BP meds once med rec competed  HYPERCHOLESTEROLEMIA Cont statin once med rec completed.      Advance Care Planning:   Code Status: Full Code  Consults: Dr. Dossie Der  Family Communication: Son at bedside  Severity of Illness: The appropriate patient status for this patient is OBSERVATION. Observation status is judged to be reasonable and necessary in order to provide the required intensity of service to ensure the patient's safety. The patient's presenting symptoms, physical exam findings, and initial radiographic and laboratory data in the context of their medical condition is felt to place them at decreased risk for further clinical deterioration. Furthermore, it is anticipated that the patient will be medically stable for discharge from the  hospital within 2 midnights of admission.   Author: [name] Bow., DO 12/15/2023 10:07 PM  For on call review [email or web address].

## 2023-12-15 NOTE — H&P (View-Only) (Signed)
Consulting Physician: Hyman Hopes Carolyna Yerian  Referring Provider: Dr. Julian Reil  Chief Complaint: Worsening decubitus wound left buttock  Reason for Consult: Decubitus wound left buttock   Subjective   HPI: [name] is an 83 y.o. female who is here for a worsening decubitus wound of the left buttock.  She has been to the ER for this wound before.  She has been trying to get to outpatient wound care without success.  Her son is at bedside and helps with the history.  He believes the wound has gotten much worse recently and has a foul odor so he brought her back to the emergency room.  Past Medical History:  Diagnosis Date   Heart murmur    Hyperlipidemia    Hypertension    Osteoporosis    Seasonal allergies     Past Surgical History:  Procedure Laterality Date   CATARACT EXTRACTION     FRACTURE SURGERY     ORIF ANKLE FRACTURE     TUBAL LIGATION     WRIST SURGERY      Family History  Problem Relation Age of Onset   Diabetes Mother    CAD Father    Diabetes Sister    Diabetes Brother    Diabetes Sister    Diabetes Sister    Cancer - Colon Brother    Cancer Brother    Alzheimer's disease Brother     Social:  reports that she has never smoked. She has never used smokeless tobacco. She reports that she does not drink alcohol and does not use drugs.  Allergies:  Allergies  Allergen Reactions   Aspirin Nausea And Vomiting    High doses    Medications: Current Outpatient Medications  Medication Instructions   acetaminophen (TYLENOL) 1,000 mg, Oral, Every 6 hours PRN   amLODipine (NORVASC) 10 mg, Oral, Daily   amLODipine (NORVASC) 2.5 mg, Oral, Every morning   aspirin 162 mg, Oral, Daily   atorvastatin (LIPITOR) 40 mg, Oral, Daily   atorvastatin (LIPITOR) 40 mg, Oral, Every evening   calcium-vitamin D (OSCAL) 250-125 MG-UNIT per tablet 1 tablet, Oral, Daily   cephALEXin (KEFLEX) 500 mg, Oral, 4 times daily   Chlorphen-Pseudoephed-APAP (CORICIDIN D PO) 1  tablet, Oral, Daily PRN   ibuprofen (ADVIL) 200 mg, Oral, Every 6 hours PRN   irbesartan (AVAPRO) 150 mg, Oral, Daily at bedtime   irbesartan (AVAPRO) 150 mg, Oral, Every evening   memantine (NAMENDA) 5 mg, Oral, 2 times daily   memantine (NAMENDA) 10 mg, Oral, 2 times daily   mirtazapine (REMERON) 7.5 MG tablet Take 1 tablet (7.5 mg total) by mouth at bedtime as needed for sleep   Multiple Vitamin (MULTIVITAMIN) tablet 1 tablet, Oral, Daily   nitrofurantoin (MACRODANTIN) 50 MG capsule Take 1 capsule (50 mg total) by mouth every evening for urinary tract infection prevention.   QUEtiapine (SEROQUEL) 25 MG tablet Take 1 tablet (25 mg total) by mouth at bedtime as needed for sleep/confusion.   solifenacin (VESICARE) 5 mg, Oral, Daily   solifenacin (VESICARE) 5 mg, Oral, Every evening    ROS - all of the below systems have been reviewed with the patient and positives are indicated with bold text General: chills, fever or night sweats Eyes: blurry vision or double vision ENT: epistaxis or sore throat Allergy/Immunology: itchy/watery eyes or nasal congestion Hematologic/Lymphatic: bleeding problems, blood clots or swollen lymph nodes Endocrine: temperature intolerance or unexpected weight changes Breast: new or changing breast lumps or nipple discharge Resp: cough,  shortness of breath, or wheezing CV: chest pain or dyspnea on exertion GI: as per HPI GU: dysuria, trouble voiding, or hematuria MSK: joint pain or joint stiffness Neuro: TIA or stroke symptoms Derm: pruritus and skin lesion changes Psych: anxiety and depression  Objective   PE Blood pressure (!) 132/51, pulse 94, temperature 97.7 F (36.5 C), resp. rate 17, height 5\' 2"  (1.575 m), weight 66.7 kg, SpO2 98%. Constitutional: NAD; conversant; no deformities Eyes: Moist conjunctiva; no lid lag; anicteric; PERRL Neck: Trachea midline; no thyromegaly Lungs: Normal respiratory effort; no tactile fremitus CV: RRR; no palpable  thrills; no pitting edema GI: Abd Soft, nontender; no palpable hepatosplenomegaly MSK: Normal range of motion of extremities; no clubbing/cyanosis Psychiatric: Appropriate affect; alert and oriented x3 Lymphatic: No palpable cervical or axillary lymphadenopathy Buttock: Thick eschar overlying wound just to the left of the gluteal cleft.  Results for orders placed or performed during the hospital encounter of 12/15/23 (from the past 24 hours)  Basic metabolic panel     Status: Abnormal   Collection Time: 12/15/23  6:44 PM  Result Value Ref Range   Sodium 140 135 - 145 mmol/L   Potassium 4.0 3.5 - 5.1 mmol/L   Chloride 106 98 - 111 mmol/L   CO2 22 22 - 32 mmol/L   Glucose, Bld 127 (H) 70 - 99 mg/dL   BUN 9 8 - 23 mg/dL   Creatinine, Ser 1.32 0.44 - 1.00 mg/dL   Calcium 9.2 8.9 - 44.0 mg/dL   GFR, Estimated >10 >27 mL/min   Anion gap 12 5 - 15  CBC with Differential     Status: Abnormal   Collection Time: 12/15/23  6:44 PM  Result Value Ref Range   WBC 11.4 (H) 4.0 - 10.5 K/uL   RBC 4.88 3.87 - 5.11 MIL/uL   Hemoglobin 13.6 12.0 - 15.0 g/dL   HCT 25.3 66.4 - 40.3 %   MCV 88.1 80.0 - 100.0 fL   MCH 27.9 26.0 - 34.0 pg   MCHC 31.6 30.0 - 36.0 g/dL   RDW 47.4 25.9 - 56.3 %   Platelets 236 150 - 400 K/uL   nRBC 0.0 0.0 - 0.2 %   Neutrophils Relative % 80 %   Neutro Abs 9.2 (H) 1.7 - 7.7 K/uL   Lymphocytes Relative 11 %   Lymphs Abs 1.2 0.7 - 4.0 K/uL   Monocytes Relative 6 %   Monocytes Absolute 0.6 0.1 - 1.0 K/uL   Eosinophils Relative 1 %   Eosinophils Absolute 0.2 0.0 - 0.5 K/uL   Basophils Relative 1 %   Basophils Absolute 0.1 0.0 - 0.1 K/uL   Immature Granulocytes 1 %   Abs Immature Granulocytes 0.08 (H) 0.00 - 0.07 K/uL  I-Stat CG4 Lactic Acid     Status: None   Collection Time: 12/15/23  7:16 PM  Result Value Ref Range   Lactic Acid, Venous 1.7 0.5 - 1.9 mmol/L    Imaging Orders  No imaging studies ordered today     Assessment and Plan   [name] is an  83 y.o. female with a buttock wound.  The eschar will likely need debrided.  She is in 10/10 pain in bed so this will likely need to be done with sedation.  I will review the case with my partner, Dr. Andrey Campanile who takes over the service in the morning to see if we can add her on to the OR schedule.    ICD-10-CM   1.  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 Use [email or web address] to contact on call provider  New Patient Billing: [id number] - Straightforward / Low MDM

## 2023-12-15 NOTE — Assessment & Plan Note (Signed)
Cont home BP meds once med rec competed

## 2023-12-15 NOTE — Consult Note (Signed)
Consulting Physician: Hyman Hopes Carolyna Yerian  Referring Provider: Dr. Julian Reil  Chief Complaint: Worsening decubitus wound left buttock  Reason for Consult: Decubitus wound left buttock   Subjective   HPI: [name] is an 83 y.o. female who is here for a worsening decubitus wound of the left buttock.  She has been to the ER for this wound before.  She has been trying to get to outpatient wound care without success.  Her son is at bedside and helps with the history.  He believes the wound has gotten much worse recently and has a foul odor so he brought her back to the emergency room.  Past Medical History:  Diagnosis Date   Heart murmur    Hyperlipidemia    Hypertension    Osteoporosis    Seasonal allergies     Past Surgical History:  Procedure Laterality Date   CATARACT EXTRACTION     FRACTURE SURGERY     ORIF ANKLE FRACTURE     TUBAL LIGATION     WRIST SURGERY      Family History  Problem Relation Age of Onset   Diabetes Mother    CAD Father    Diabetes Sister    Diabetes Brother    Diabetes Sister    Diabetes Sister    Cancer - Colon Brother    Cancer Brother    Alzheimer's disease Brother     Social:  reports that she has never smoked. She has never used smokeless tobacco. She reports that she does not drink alcohol and does not use drugs.  Allergies:  Allergies  Allergen Reactions   Aspirin Nausea And Vomiting    High doses    Medications: Current Outpatient Medications  Medication Instructions   acetaminophen (TYLENOL) 1,000 mg, Oral, Every 6 hours PRN   amLODipine (NORVASC) 10 mg, Oral, Daily   amLODipine (NORVASC) 2.5 mg, Oral, Every morning   aspirin 162 mg, Oral, Daily   atorvastatin (LIPITOR) 40 mg, Oral, Daily   atorvastatin (LIPITOR) 40 mg, Oral, Every evening   calcium-vitamin D (OSCAL) 250-125 MG-UNIT per tablet 1 tablet, Oral, Daily   cephALEXin (KEFLEX) 500 mg, Oral, 4 times daily   Chlorphen-Pseudoephed-APAP (CORICIDIN D PO) 1  tablet, Oral, Daily PRN   ibuprofen (ADVIL) 200 mg, Oral, Every 6 hours PRN   irbesartan (AVAPRO) 150 mg, Oral, Daily at bedtime   irbesartan (AVAPRO) 150 mg, Oral, Every evening   memantine (NAMENDA) 5 mg, Oral, 2 times daily   memantine (NAMENDA) 10 mg, Oral, 2 times daily   mirtazapine (REMERON) 7.5 MG tablet Take 1 tablet (7.5 mg total) by mouth at bedtime as needed for sleep   Multiple Vitamin (MULTIVITAMIN) tablet 1 tablet, Oral, Daily   nitrofurantoin (MACRODANTIN) 50 MG capsule Take 1 capsule (50 mg total) by mouth every evening for urinary tract infection prevention.   QUEtiapine (SEROQUEL) 25 MG tablet Take 1 tablet (25 mg total) by mouth at bedtime as needed for sleep/confusion.   solifenacin (VESICARE) 5 mg, Oral, Daily   solifenacin (VESICARE) 5 mg, Oral, Every evening    ROS - all of the below systems have been reviewed with the patient and positives are indicated with bold text General: chills, fever or night sweats Eyes: blurry vision or double vision ENT: epistaxis or sore throat Allergy/Immunology: itchy/watery eyes or nasal congestion Hematologic/Lymphatic: bleeding problems, blood clots or swollen lymph nodes Endocrine: temperature intolerance or unexpected weight changes Breast: new or changing breast lumps or nipple discharge Resp: cough,  shortness of breath, or wheezing CV: chest pain or dyspnea on exertion GI: as per HPI GU: dysuria, trouble voiding, or hematuria MSK: joint pain or joint stiffness Neuro: TIA or stroke symptoms Derm: pruritus and skin lesion changes Psych: anxiety and depression  Objective   PE Blood pressure (!) 132/51, pulse 94, temperature 97.7 F (36.5 C), resp. rate 17, height 5\' 2"  (1.575 m), weight 66.7 kg, SpO2 98%. Constitutional: NAD; conversant; no deformities Eyes: Moist conjunctiva; no lid lag; anicteric; PERRL Neck: Trachea midline; no thyromegaly Lungs: Normal respiratory effort; no tactile fremitus CV: RRR; no palpable  thrills; no pitting edema GI: Abd Soft, nontender; no palpable hepatosplenomegaly MSK: Normal range of motion of extremities; no clubbing/cyanosis Psychiatric: Appropriate affect; alert and oriented x3 Lymphatic: No palpable cervical or axillary lymphadenopathy Buttock: Thick eschar overlying wound just to the left of the gluteal cleft.  Results for orders placed or performed during the hospital encounter of 12/15/23 (from the past 24 hours)  Basic metabolic panel     Status: Abnormal   Collection Time: 12/15/23  6:44 PM  Result Value Ref Range   Sodium 140 135 - 145 mmol/L   Potassium 4.0 3.5 - 5.1 mmol/L   Chloride 106 98 - 111 mmol/L   CO2 22 22 - 32 mmol/L   Glucose, Bld 127 (H) 70 - 99 mg/dL   BUN 9 8 - 23 mg/dL   Creatinine, Ser 1.32 0.44 - 1.00 mg/dL   Calcium 9.2 8.9 - 44.0 mg/dL   GFR, Estimated >10 >27 mL/min   Anion gap 12 5 - 15  CBC with Differential     Status: Abnormal   Collection Time: 12/15/23  6:44 PM  Result Value Ref Range   WBC 11.4 (H) 4.0 - 10.5 K/uL   RBC 4.88 3.87 - 5.11 MIL/uL   Hemoglobin 13.6 12.0 - 15.0 g/dL   HCT 25.3 66.4 - 40.3 %   MCV 88.1 80.0 - 100.0 fL   MCH 27.9 26.0 - 34.0 pg   MCHC 31.6 30.0 - 36.0 g/dL   RDW 47.4 25.9 - 56.3 %   Platelets 236 150 - 400 K/uL   nRBC 0.0 0.0 - 0.2 %   Neutrophils Relative % 80 %   Neutro Abs 9.2 (H) 1.7 - 7.7 K/uL   Lymphocytes Relative 11 %   Lymphs Abs 1.2 0.7 - 4.0 K/uL   Monocytes Relative 6 %   Monocytes Absolute 0.6 0.1 - 1.0 K/uL   Eosinophils Relative 1 %   Eosinophils Absolute 0.2 0.0 - 0.5 K/uL   Basophils Relative 1 %   Basophils Absolute 0.1 0.0 - 0.1 K/uL   Immature Granulocytes 1 %   Abs Immature Granulocytes 0.08 (H) 0.00 - 0.07 K/uL  I-Stat CG4 Lactic Acid     Status: None   Collection Time: 12/15/23  7:16 PM  Result Value Ref Range   Lactic Acid, Venous 1.7 0.5 - 1.9 mmol/L    Imaging Orders  No imaging studies ordered today     Assessment and Plan   [name] is an  83 y.o. female with a buttock wound.  The eschar will likely need debrided.  She is in 10/10 pain in bed so this will likely need to be done with sedation.  I will review the case with my partner, Dr. Andrey Campanile who takes over the service in the morning to see if we can add her on to the OR schedule.    ICD-10-CM   1.  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 Use [email or web address] to contact on call provider  New Patient Billing: [id number] - Straightforward / Low MDM

## 2023-12-15 NOTE — ED Triage Notes (Signed)
Pt presents to ED for follow up of sacral ulcer wound. Pt was seen in ED for same on 12/31. Pt was set up for once a week wound care at home and pt has had one visit so far. Pt brought back in by son because he states the wound is not getting any better. Pt has dementia at baseline. Denies any new confusion or agitation. or worsening symptoms. Pt is on the waiting list for [facility] wound clinic and at [facility] wound clinic.

## 2023-12-15 NOTE — ED Provider Notes (Signed)
[facility] EMERGENCY DEPARTMENT AT [facility] Provider Note   CSN: [id number] Arrival date & time: 12/15/23  1417     History  Chief Complaint  Patient presents with   Wound Check    [name] is a 83 y.o. female.  83 yo F with a chief complaints of sacral wound.  The patient was actually seen in the emergency department for this previously.  She has had a wound nurse come out to visit her.  She has dementia does not provide much history.  This son provides the majority of the history.  He feels like the wound is gotten much worse.  No noted fevers but is gotten a bit more tender and larger and started to have felt discharge.  He was not sure that the person came out was able to help much with the wound care.  They have been trying to get into a wound clinic but have been struggling.   Wound Check       Home Medications Prior to Admission medications   Medication Sig Start Date End Date Taking? Authorizing Provider  acetaminophen (TYLENOL) 500 MG tablet Take 1,000 mg by mouth every 6 (six) hours as needed for fever.    [provider]  amLODipine (NORVASC) 10 MG tablet Take 10 mg by mouth daily.    [provider]  amLODipine (NORVASC) 2.5 MG tablet Take 1 tablet (2.5 mg total) by mouth in the morning. 11/06/23     aspirin 81 MG tablet Take 162 mg by mouth daily.     [provider]  atorvastatin (LIPITOR) 40 MG tablet Take 40 mg by mouth daily.    [provider]  atorvastatin (LIPITOR) 40 MG tablet Take 1 tablet (40 mg total) by mouth every evening. 11/06/23     calcium-vitamin D (OSCAL) 250-125 MG-UNIT per tablet Take 1 tablet by mouth daily.    [provider]  cephALEXin (KEFLEX) 500 MG capsule Take 1 capsule (500 mg total) by mouth 4 (four) times daily. 11/27/23   Margarita Grizzle, MD  Chlorphen-Pseudoephed-APAP (CORICIDIN D PO) Take 1 tablet by mouth daily as needed (allergies).    [provider]  ibuprofen  (ADVIL,MOTRIN) 200 MG tablet Take 200 mg by mouth every 6 (six) hours as needed for moderate pain.     [provider]  irbesartan (AVAPRO) 150 MG tablet Take 150 mg by mouth at bedtime.    [provider]  irbesartan (AVAPRO) 150 MG tablet Take 1 tablet (150 mg total) by mouth every evening. 11/06/23     memantine (NAMENDA) 10 MG tablet Take 1 tablet (10 mg total) by mouth 2 (two) times daily. 11/06/23     memantine (NAMENDA) 5 MG tablet Take 5 mg by mouth 2 (two) times daily.    [provider]  mirtazapine (REMERON) 7.5 MG tablet Take 1 tablet (7.5 mg total) by mouth at bedtime as needed for sleep 11/06/23     Multiple Vitamin (MULTIVITAMIN) tablet Take 1 tablet by mouth daily.    [provider]  nitrofurantoin (MACRODANTIN) 50 MG capsule Take 1 capsule (50 mg total) by mouth every evening for urinary tract infection prevention. 03/04/23   Noberto Retort, MD  QUEtiapine (SEROQUEL) 25 MG tablet Take 1 tablet (25 mg total) by mouth at bedtime as needed for sleep/confusion. 11/06/23     solifenacin (VESICARE) 5 MG tablet Take 5 mg by mouth daily.    [provider]  solifenacin (VESICARE)  5 MG tablet Take 1 tablet (5 mg total) by mouth every evening. 12/03/23         Allergies    Aspirin    Review of Systems   Review of Systems  Physical Exam Updated Vital Signs BP (!) 132/51   Pulse 94   Temp 97.7 F (36.5 C)   Resp 17   Ht 5\' 2"  (1.575 m)   Wt 66.7 kg   LMP  (LMP Unknown)   SpO2 98%   BMI 26.89 kg/m  Physical Exam Vitals and nursing note reviewed.  Constitutional:      General: She is not in acute distress.    Appearance: She is well-developed. She is not diaphoretic.  HENT:     Head: Normocephalic and atraumatic.  Eyes:     Pupils: Pupils are equal, round, and reactive to light.  Cardiovascular:     Rate and Rhythm: Normal rate and regular rhythm.     Heart sounds: No murmur heard.    No friction rub. No gallop.  Pulmonary:      Effort: Pulmonary effort is normal.     Breath sounds: No wheezing or rales.  Abdominal:     General: There is no distension.     Palpations: Abdomen is soft.     Tenderness: There is no abdominal tenderness.  Musculoskeletal:        General: No tenderness.     Cervical back: Normal range of motion and neck supple.  Skin:    General: Skin is warm and dry.     Comments: Patient with a sacral wound just left of the gluteal cleft some dark discoloration to the overlying skin with some granulation tissue and some foul-smelling drainage.  I do not appreciate any obvious fluctuance.  There is some mild induration.  Warm to the touch.  Neurological:     Mental Status: She is alert and oriented to person, place, and time.  Psychiatric:        Behavior: Behavior normal.        ED Results / Procedures / Treatments   Labs (all labs ordered are listed, but only abnormal results are displayed) Labs Reviewed  BASIC METABOLIC PANEL - Abnormal; Notable for the following components:      Result Value   Glucose, Bld 127 (*)    All other components within normal limits  CBC WITH DIFFERENTIAL/PLATELET - Abnormal; Notable for the following components:   WBC 11.4 (*)    Neutro Abs 9.2 (*)    Abs Immature Granulocytes 0.08 (*)    All other components within normal limits  CULTURE, BLOOD (ROUTINE X 2)  CULTURE, BLOOD (ROUTINE X 2)  I-STAT CG4 LACTIC ACID, ED  I-STAT CG4 LACTIC ACID, ED    EKG None  Radiology No results found.  Procedures Procedures    Medications Ordered in ED Medications  ceFEPIme (MAXIPIME) 2 g in sodium chloride 0.9 % 100 mL IVPB (0 g Intravenous Stopped 12/15/23 2002)  metroNIDAZOLE (FLAGYL) IVPB 500 mg (500 mg Intravenous New Bag/Given 12/15/23 2013)  QUEtiapine (SEROQUEL) tablet 25 mg (25 mg Oral Given 12/15/23 2008)    ED Course/ Medical Decision Making/ A&P                                 Medical Decision Making Risk Prescription drug  management.   83 yo F with a chief complaints of a sacral wound.  This  is been going on for a little while now.  They have tried to get into the wound care center without appointment.  They have gotten some home health help but did not think that they were that helpful.  The son brought her in today because he felt he was getting worse.  Patient does have some foul-smelling drainage, warm to the touch.  Concern for surrounding cellulitis.  Will start on antibiotics.  Will discuss with medicine.  I discussed case with the hospitalist, Dr. Julian Reil.  He was not sure the patient would benefit from IV antibiotics and admission.  He recommended surgical consult.  Discussed with gen surgery will see patient in the hospital.   The patients results and plan were reviewed and discussed.   Any x-rays performed were independently reviewed by myself.   Differential diagnosis were considered with the presenting HPI.  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

## 2023-12-15 NOTE — Assessment & Plan Note (Addendum)
Rocephin / flagyl Wound care consult Gen surg consult: NPO after MN Suspect pt will need debridement of overlying eschar. PRN tylenol for pain to start I offered to order IV narcotics (ie morphine, fentanyl, dilaudid), but son wanted to hold off on ordering those for the moment, and pt denying pain right now.  Told son / RN they just needed to ask and Id be happy to order these if needed.

## 2023-12-15 NOTE — Assessment & Plan Note (Signed)
Cont statin once med rec completed.

## 2023-12-16 ENCOUNTER — Encounter (HOSPITAL_COMMUNITY): Payer: Self-pay | Admitting: Internal Medicine

## 2023-12-16 ENCOUNTER — Encounter (HOSPITAL_COMMUNITY)
Admission: EM | Disposition: A | Discharge status: Home Health | Payer: Self-pay | Source: Home / Self Care | Attending: Family Medicine

## 2023-12-16 ENCOUNTER — Other Ambulatory Visit: Payer: Self-pay

## 2023-12-16 ENCOUNTER — Observation Stay (HOSPITAL_COMMUNITY): Payer: Medicare Other | Admitting: Certified Registered"

## 2023-12-16 DIAGNOSIS — I10 Essential (primary) hypertension: Secondary | ICD-10-CM | POA: Diagnosis not present

## 2023-12-16 DIAGNOSIS — G4700 Insomnia, unspecified: Secondary | ICD-10-CM | POA: Diagnosis present

## 2023-12-16 DIAGNOSIS — Z820 Family history of epilepsy and other diseases of the nervous system: Secondary | ICD-10-CM | POA: Diagnosis not present

## 2023-12-16 DIAGNOSIS — Z8249 Family history of ischemic heart disease and other diseases of the circulatory system: Secondary | ICD-10-CM | POA: Diagnosis not present

## 2023-12-16 DIAGNOSIS — L89309 Pressure ulcer of unspecified buttock, unspecified stage: Secondary | ICD-10-CM | POA: Diagnosis present

## 2023-12-16 DIAGNOSIS — E7800 Pure hypercholesterolemia, unspecified: Secondary | ICD-10-CM

## 2023-12-16 DIAGNOSIS — Z7982 Long term (current) use of aspirin: Secondary | ICD-10-CM | POA: Diagnosis not present

## 2023-12-16 DIAGNOSIS — Z833 Family history of diabetes mellitus: Secondary | ICD-10-CM | POA: Diagnosis not present

## 2023-12-16 DIAGNOSIS — E785 Hyperlipidemia, unspecified: Secondary | ICD-10-CM

## 2023-12-16 DIAGNOSIS — L89159 Pressure ulcer of sacral region, unspecified stage: Secondary | ICD-10-CM | POA: Diagnosis not present

## 2023-12-16 DIAGNOSIS — Z886 Allergy status to analgesic agent status: Secondary | ICD-10-CM | POA: Diagnosis not present

## 2023-12-16 DIAGNOSIS — L89329 Pressure ulcer of left buttock, unspecified stage: Secondary | ICD-10-CM

## 2023-12-16 DIAGNOSIS — I96 Gangrene, not elsewhere classified: Secondary | ICD-10-CM | POA: Diagnosis present

## 2023-12-16 DIAGNOSIS — Z79899 Other long term (current) drug therapy: Secondary | ICD-10-CM | POA: Diagnosis not present

## 2023-12-16 DIAGNOSIS — L89300 Pressure ulcer of unspecified buttock, unstageable: Secondary | ICD-10-CM | POA: Diagnosis not present

## 2023-12-16 DIAGNOSIS — F0390 Unspecified dementia without behavioral disturbance: Secondary | ICD-10-CM | POA: Diagnosis present

## 2023-12-16 HISTORY — PX: INCISION AND DRAINAGE OF WOUND: SHX1803

## 2023-12-16 LAB — CBC
HCT: 38.9 % (ref 36.0–46.0)
Hemoglobin: 12.8 g/dL (ref 12.0–15.0)
MCH: 28.1 pg (ref 26.0–34.0)
MCHC: 32.9 g/dL (ref 30.0–36.0)
MCV: 85.5 fL (ref 80.0–100.0)
Platelets: 254 10*3/uL (ref 150–400)
RBC: 4.55 MIL/uL (ref 3.87–5.11)
RDW: 15.1 % (ref 11.5–15.5)
WBC: 12.8 10*3/uL — ABNORMAL HIGH (ref 4.0–10.5)
nRBC: 0 % (ref 0.0–0.2)

## 2023-12-16 LAB — BASIC METABOLIC PANEL
Anion gap: 14 (ref 5–15)
BUN: 6 mg/dL — ABNORMAL LOW (ref 8–23)
CO2: 18 mmol/L — ABNORMAL LOW (ref 22–32)
Calcium: 8.8 mg/dL — ABNORMAL LOW (ref 8.9–10.3)
Chloride: 107 mmol/L (ref 98–111)
Creatinine, Ser: 0.61 mg/dL (ref 0.44–1.00)
GFR, Estimated: >60 mL/min (ref >60)
Glucose, Bld: 138 mg/dL — ABNORMAL HIGH (ref 70–99)
Potassium: 4.1 mmol/L (ref 3.5–5.1)
Sodium: 139 mmol/L (ref 135–145)

## 2023-12-16 SURGERY — IRRIGATION AND DEBRIDEMENT WOUND
Anesthesia: General | Site: Buttocks | Laterality: Left

## 2023-12-16 MED ORDER — EPHEDRINE 5 MG/ML INJ
INTRAVENOUS | Status: AC
  Filled 2023-12-16: qty 5

## 2023-12-16 MED ORDER — PROPOFOL 10 MG/ML IV BOLUS
INTRAVENOUS | Status: AC
  Filled 2023-12-16: qty 20

## 2023-12-16 MED ORDER — CHLORHEXIDINE GLUCONATE 0.12 % MT SOLN
OROMUCOSAL | Status: AC
  Administered 2023-12-16: 15 mL via OROMUCOSAL
  Filled 2023-12-16: qty 15

## 2023-12-16 MED ORDER — ROCURONIUM BROMIDE 10 MG/ML (PF) SYRINGE
PREFILLED_SYRINGE | INTRAVENOUS | Status: AC
  Filled 2023-12-16: qty 10

## 2023-12-16 MED ORDER — DEXAMETHASONE SODIUM PHOSPHATE 10 MG/ML IJ SOLN
INTRAMUSCULAR | Status: AC
  Filled 2023-12-16: qty 1

## 2023-12-16 MED ORDER — PHENYLEPHRINE 80 MCG/ML (10ML) SYRINGE FOR IV PUSH (FOR BLOOD PRESSURE SUPPORT)
PREFILLED_SYRINGE | INTRAVENOUS | Status: DC | PRN
  Administered 2023-12-16: 160 ug via INTRAVENOUS
  Administered 2023-12-16: 80 ug via INTRAVENOUS

## 2023-12-16 MED ORDER — ONDANSETRON HCL 4 MG/2ML IJ SOLN
INTRAMUSCULAR | Status: AC
  Filled 2023-12-16: qty 2

## 2023-12-16 MED ORDER — 0.9 % SODIUM CHLORIDE (POUR BTL) OPTIME
TOPICAL | Status: DC | PRN
  Administered 2023-12-16: 1000 mL

## 2023-12-16 MED ORDER — ONDANSETRON HCL 4 MG/2ML IJ SOLN
INTRAMUSCULAR | Status: DC | PRN
  Administered 2023-12-16: 4 mg via INTRAVENOUS

## 2023-12-16 MED ORDER — PHENYLEPHRINE HCL-NACL 20-0.9 MG/250ML-% IV SOLN
INTRAVENOUS | Status: DC | PRN
  Administered 2023-12-16: 20 ug/min via INTRAVENOUS

## 2023-12-16 MED ORDER — FENTANYL CITRATE (PF) 250 MCG/5ML IJ SOLN
INTRAMUSCULAR | Status: DC | PRN
  Administered 2023-12-16: 25 ug via INTRAVENOUS

## 2023-12-16 MED ORDER — BUPIVACAINE-EPINEPHRINE 0.25% -1:200000 IJ SOLN
INTRAMUSCULAR | Status: DC | PRN
  Administered 2023-12-16: 9.5 mL

## 2023-12-16 MED ORDER — TRAMADOL HCL 50 MG PO TABS: 50.0000 mg | ORAL_TABLET | Freq: Four times a day (QID) | ORAL | Status: DC | PRN

## 2023-12-16 MED ORDER — LACTATED RINGERS IV SOLN: INTRAVENOUS | Status: DC

## 2023-12-16 MED ORDER — ENOXAPARIN SODIUM 40 MG/0.4ML IJ SOSY
40.0000 mg | PREFILLED_SYRINGE | INTRAMUSCULAR | Status: DC
  Administered 2023-12-17 – 2023-12-18 (×2): 40 mg via SUBCUTANEOUS
  Filled 2023-12-16 (×2): qty 0.4

## 2023-12-16 MED ORDER — FENTANYL CITRATE (PF) 100 MCG/2ML IJ SOLN: 25.0000 ug | INTRAMUSCULAR | Status: DC | PRN

## 2023-12-16 MED ORDER — LIDOCAINE 2% (20 MG/ML) 5 ML SYRINGE
INTRAMUSCULAR | Status: DC | PRN
  Administered 2023-12-16: 50 mg via INTRAVENOUS

## 2023-12-16 MED ORDER — MORPHINE SULFATE (PF) 2 MG/ML IV SOLN: 0.5000 mg | INTRAVENOUS | Status: DC | PRN

## 2023-12-16 MED ORDER — CHLORHEXIDINE GLUCONATE 0.12 % MT SOLN: 15.0000 mL | Freq: Once | OROMUCOSAL | Status: AC

## 2023-12-16 MED ORDER — OXYCODONE HCL 5 MG PO TABS: 5.0000 mg | ORAL_TABLET | Freq: Once | ORAL | Status: DC | PRN

## 2023-12-16 MED ORDER — DEXAMETHASONE SODIUM PHOSPHATE 10 MG/ML IJ SOLN
INTRAMUSCULAR | Status: DC | PRN
  Administered 2023-12-16: 5 mg via INTRAVENOUS

## 2023-12-16 MED ORDER — PHENYLEPHRINE 80 MCG/ML (10ML) SYRINGE FOR IV PUSH (FOR BLOOD PRESSURE SUPPORT)
PREFILLED_SYRINGE | INTRAVENOUS | Status: AC
  Filled 2023-12-16: qty 10

## 2023-12-16 MED ORDER — BUPIVACAINE-EPINEPHRINE (PF) 0.25% -1:200000 IJ SOLN
INTRAMUSCULAR | Status: AC
  Filled 2023-12-16: qty 30

## 2023-12-16 MED ORDER — PROPOFOL 10 MG/ML IV BOLUS
INTRAVENOUS | Status: DC | PRN
  Administered 2023-12-16: 120 mg via INTRAVENOUS

## 2023-12-16 MED ORDER — FENTANYL CITRATE (PF) 100 MCG/2ML IJ SOLN
INTRAMUSCULAR | Status: AC
  Filled 2023-12-16: qty 2

## 2023-12-16 MED ORDER — OXYCODONE HCL 5 MG/5ML PO SOLN: 5.0000 mg | Freq: Once | ORAL | Status: DC | PRN

## 2023-12-16 MED ORDER — ORAL CARE MOUTH RINSE: 15.0000 mL | Freq: Once | OROMUCOSAL | Status: AC

## 2023-12-16 MED ORDER — LIDOCAINE 2% (20 MG/ML) 5 ML SYRINGE
INTRAMUSCULAR | Status: AC
  Filled 2023-12-16: qty 5

## 2023-12-16 SURGICAL SUPPLY — 32 items
BAG COUNTER SPONGE SURGICOUNT (BAG) ×2 IMPLANT
BRIEF MESH DISP LRG (UNDERPADS AND DIAPERS) ×2 IMPLANT
CANISTER SUCT 3000ML PPV (MISCELLANEOUS) ×2 IMPLANT
CONNECTOR 5 IN 1 STRAIGHT STRL (MISCELLANEOUS) IMPLANT
COVER SURGICAL LIGHT HANDLE (MISCELLANEOUS) ×2 IMPLANT
ELECT REM PT RETURN 9FT ADLT (ELECTROSURGICAL) ×1
ELECTRODE REM PT RTRN 9FT ADLT (ELECTROSURGICAL) ×2 IMPLANT
GAUZE PACKING IODOFORM 1/2INX (GAUZE/BANDAGES/DRESSINGS) IMPLANT
GAUZE PAD ABD 8X10 STRL (GAUZE/BANDAGES/DRESSINGS) ×2 IMPLANT
GAUZE SPONGE 4X4 12PLY STRL (GAUZE/BANDAGES/DRESSINGS) ×2 IMPLANT
GLOVE BIOGEL PI IND STRL 6.5 (GLOVE) IMPLANT
GLOVE BIOGEL PI MICRO STRL 6 (GLOVE) IMPLANT
GLOVE PI ORTHO PRO STRL 7.5 (GLOVE) ×2 IMPLANT
GOWN STRL REUS W/ TWL LRG LVL3 (GOWN DISPOSABLE) ×2 IMPLANT
GOWN STRL REUS W/ TWL XL LVL3 (GOWN DISPOSABLE) ×2 IMPLANT
KIT BASIN OR (CUSTOM PROCEDURE TRAY) ×2 IMPLANT
KIT TURNOVER KIT B (KITS) ×2 IMPLANT
MARKER SKIN DUAL TIP RULER LAB (MISCELLANEOUS) IMPLANT
NDL HYPO 22X1.5 SAFETY MO (MISCELLANEOUS) IMPLANT
NEEDLE HYPO 22X1.5 SAFETY MO (MISCELLANEOUS) ×1 IMPLANT
NS IRRIG 1000ML POUR BTL (IV SOLUTION) ×2 IMPLANT
PACK GENERAL/GYN (CUSTOM PROCEDURE TRAY) IMPLANT
PAD ARMBOARD 7.5X6 YLW CONV (MISCELLANEOUS) ×2 IMPLANT
SLEEVE SCD COMPRESS KNEE MED (STOCKING) IMPLANT
SOL PREP POV-IOD 4OZ 10% (MISCELLANEOUS) IMPLANT
SWAB COLLECTION DEVICE MRSA (MISCELLANEOUS) IMPLANT
SWAB CULTURE ESWAB REG 1ML (MISCELLANEOUS) IMPLANT
SYR 10ML LL (SYRINGE) IMPLANT
TOWEL GREEN STERILE (TOWEL DISPOSABLE) ×2 IMPLANT
TOWEL GREEN STERILE FF (TOWEL DISPOSABLE) ×2 IMPLANT
TUBE CONNECTING 12X1/4 (SUCTIONS) IMPLANT
UNDERPAD 30X36 HEAVY ABSORB (UNDERPADS AND DIAPERS) ×2 IMPLANT

## 2023-12-16 NOTE — Interval H&P Note (Signed)
History and Physical Interval Note:  12/16/2023 8:34 AM  [name]  has presented today for surgery, with the diagnosis of DECUBITUS ULCER.  The various methods of treatment have been discussed with the patient and family. After consideration of risks, benefits and other options for treatment, the patient has consented to  Procedure(s): EXCISIONAL DEBRIDMENT DECUBITUS ULCER (N/A) as a surgical intervention.  The patient's history has been reviewed, patient examined, no change in status, stable for surgery.  I have reviewed the patient's chart and labs.  Questions were answered to the patient's satisfaction.    Discussed with son via phone re for surgery, steps, risk/benefits including but not limited to bleeding, infection, injury to surrounding structures, chronic wound, nonhealing wound, need for addl procedures, blood clot, perioperative cardiac/pulm issues; typical postop course [name]

## 2023-12-16 NOTE — Plan of Care (Signed)
  Problem: Coping: Goal: Level of anxiety will decrease Outcome: Progressing   Problem: Elimination: Goal: Will not experience complications related to urinary retention Outcome: Progressing   

## 2023-12-16 NOTE — Anesthesia Procedure Notes (Signed)
Procedure Name: LMA Insertion Date/Time: 12/16/2023 12:20 AM  Performed by: Alwyn Ren, CRNAPre-anesthesia Checklist: Patient identified, Timeout performed, Emergency Drugs available, Suction available and Patient being monitored Patient Re-evaluated:Patient Re-evaluated prior to induction Oxygen Delivery Method: Circle system utilized Preoxygenation: Pre-oxygenation with 100% oxygen Induction Type: IV induction LMA: LMA inserted LMA Size: 3.0 Number of attempts: 1 Comments: Pt has upper dentures in

## 2023-12-16 NOTE — Progress Notes (Signed)
Pt arrived to 6n11 from ED via stretcher. Unable to move self to bed. Son at bedside(lives with mom)- states she uses a walker at home.Bed in low position, call light within reach, bed alarm on. Son is spending the night.

## 2023-12-16 NOTE — Care Management Obs Status (Signed)
MEDICARE OBSERVATION STATUS NOTIFICATION   Patient Details  Name: [name] MRN: [id number] Date of Birth: [birth year]   Medicare Observation Status Notification Given:  Yes  Son signed due to patient had surgery today.  Ronny Bacon, RN 12/16/2023, 1:54 PM

## 2023-12-16 NOTE — Progress Notes (Signed)
OT Cancellation Note  Patient Details Name: [name] MRN: [id number] DOB: 04-09-1941   Cancelled Treatment:    Reason Eval/Treat Not Completed: Other (comment) (Pt scheduled for surgery today for debridement. Will follow up post op as appropriate.)  Elzy Tomasello,[name] 12/16/2023, 9:23 AM [name], OT/L   Acute OT Clinical Specialist Acute Rehabilitation Services Pager [phone] Office [phone]

## 2023-12-16 NOTE — Hospital Course (Signed)
[name] is a 83 y.o. female with a history of hypertension, hyperlipidemia, dementia.  Patient presented secondary to worsening decubitus ulcer with concern for infection. Empiric antibiotics started and general surgery consulted for debridement.

## 2023-12-16 NOTE — Progress Notes (Signed)
Per MD, he spoke to pt's son over the phone concerning surgery this morning.  Pt's son who is to consent for surgery upon arrival to hospital, patient is not A&Ox4. Son will be going straight to OR with pt. Therefor, this RN is unable to complete the informed consent with son.  OR RN informed of this and to obtain consent once son arrives prior to surgery.  CHG bath completed. Prior tests (COVID, MRSA) were not completed as surgery was not confirmed until this AM.

## 2023-12-16 NOTE — ED Notes (Signed)
ED TO INPATIENT HANDOFF REPORT  ED Nurse Name and Phone #: Hansel Starling [phone]   S Name/Age/Gender [name] 83 y.o. female Room/Bed: 047C/047C  Code Status   Code Status: Full Code  Home/SNF/Other Home Patient oriented to: self and place Is this baseline? Yes   Triage Complete: Triage complete  Chief Complaint Decubitus ulcer of buttock [L89.309]  Triage Note Pt presents to ED for follow up of sacral ulcer wound. Pt was seen in ED for same on 12/31. Pt was set up for once a week wound care at home and pt has had one visit so far. Pt brought back in by son because he states the wound is not getting any better. Pt has dementia at baseline. Denies any new confusion or agitation. or worsening symptoms. Pt is on the waiting list for [facility] wound [facility] and at [facility] wound clinic.     Allergies Allergies  Allergen Reactions   Aspirin Nausea And Vomiting    High doses    Level of Care/Admitting Diagnosis ED Disposition     ED Disposition  Admit   Condition  --   Comment  Hospital Area: [facility] [[id number]]  Level of Care: Med-Surg [16]  May place patient in observation at [facility] or [facility] if equivalent level of care is available:: No  Covid Evaluation: Asymptomatic - no recent exposure (last 10 days) testing not required  Diagnosis: Decubitus ulcer of buttock [454098]  Admitting Physician: [name] Bow [[id number]]  Attending Physician: [name] Bow [phone]          B Medical/Surgery History Past Medical History:  Diagnosis Date   Heart murmur    Hyperlipidemia    Hypertension    Osteoporosis    Seasonal allergies    Past Surgical History:  Procedure Laterality Date   CATARACT EXTRACTION     FRACTURE SURGERY     ORIF ANKLE FRACTURE     TUBAL LIGATION     WRIST SURGERY       A IV Location/Drains/Wounds Patient Lines/Drains/Airways Status     Active Line/Drains/Airways     Name Placement date Placement time Site Days    Peripheral IV 12/15/23 22 G Left Antecubital 12/15/23  1844  Antecubital  1            Intake/Output Last 24 hours No intake or output data in the 24 hours ending 12/16/23 0017  Labs/Imaging Results for orders placed or performed during the hospital encounter of 12/15/23 (from the past 48 hours)  Basic metabolic panel     Status: Abnormal   Collection Time: 12/15/23  6:44 PM  Result Value Ref Range   Sodium 140 135 - 145 mmol/L   Potassium 4.0 3.5 - 5.1 mmol/L   Chloride 106 98 - 111 mmol/L   CO2 22 22 - 32 mmol/L   Glucose, Bld 127 (H) 70 - 99 mg/dL    Comment: Glucose reference range applies only to samples taken after fasting for at least 8 hours.   BUN 9 8 - 23 mg/dL   Creatinine, Ser 9.56 0.44 - 1.00 mg/dL   Calcium 9.2 8.9 - 21.3 mg/dL   GFR, Estimated >08 >65 mL/min    Comment: (NOTE) Calculated using the CKD-EPI Creatinine Equation (2021)    Anion gap 12 5 - 15    Comment: Performed at [facility], [address], [place], [place] [address]  CBC with Differential     Status: Abnormal  Collection Time: 12/15/23  6:44 PM  Result Value Ref Range   WBC 11.4 (H) 4.0 - 10.5 K/uL   RBC 4.88 3.87 - 5.11 MIL/uL   Hemoglobin 13.6 12.0 - 15.0 g/dL   HCT 40.9 81.1 - 91.4 %   MCV 88.1 80.0 - 100.0 fL   MCH 27.9 26.0 - 34.0 pg   MCHC 31.6 30.0 - 36.0 g/dL   RDW 78.2 95.6 - 21.3 %   Platelets 236 150 - 400 K/uL   nRBC 0.0 0.0 - 0.2 %   Neutrophils Relative % 80 %   Neutro Abs 9.2 (H) 1.7 - 7.7 K/uL   Lymphocytes Relative 11 %   Lymphs Abs 1.2 0.7 - 4.0 K/uL   Monocytes Relative 6 %   Monocytes Absolute 0.6 0.1 - 1.0 K/uL   Eosinophils Relative 1 %   Eosinophils Absolute 0.2 0.0 - 0.5 K/uL   Basophils Relative 1 %   Basophils Absolute 0.1 0.0 - 0.1 K/uL   Immature Granulocytes 1 %   Abs Immature Granulocytes 0.08 (H) 0.00 - 0.07 K/uL    Comment: Performed at [facility], [address] [place], [place] [address]  I-Stat CG4 Lactic Acid     Status:  None   Collection Time: 12/15/23  7:16 PM  Result Value Ref Range   Lactic Acid, Venous 1.7 0.5 - 1.9 mmol/L   No results found.  Pending Labs Unresulted Labs (From admission, onward)     Start     Ordered   12/16/23 0500  CBC  Tomorrow morning,   R        12/15/23 2205   12/16/23 0500  Basic metabolic panel  Tomorrow morning,   R        12/15/23 2205   12/15/23 1836  Blood culture (routine x 2)  BLOOD CULTURE X 2,   R      12/15/23 1835            Vitals/Pain Today's Vitals   12/15/23 1431 12/15/23 1900 12/15/23 1905 12/16/23 0012  BP:  (!) 132/51    Pulse:  94    Resp:  17    Temp:   97.7 F (36.5 C) 98.4 F (36.9 C)  TempSrc:    Axillary  SpO2:  98%    Weight: 66.7 kg     Height: 5\' 2"  (1.575 m)       Isolation Precautions No active isolations  Medications Medications  enoxaparin (LOVENOX) injection 40 mg (has no administration in time range)  acetaminophen (TYLENOL) tablet 650 mg (has no administration in time range)    Or  acetaminophen (TYLENOL) suppository 650 mg (has no administration in time range)  ondansetron (ZOFRAN) tablet 4 mg (has no administration in time range)    Or  ondansetron (ZOFRAN) injection 4 mg (has no administration in time range)  cefTRIAXone (ROCEPHIN) 1 g in sodium chloride 0.9 % 100 mL IVPB (has no administration in time range)  metroNIDAZOLE (FLAGYL) IVPB 500 mg (has no administration in time range)  ceFEPIme (MAXIPIME) 2 g in sodium chloride 0.9 % 100 mL IVPB (0 g Intravenous Stopped 12/15/23 2002)  metroNIDAZOLE (FLAGYL) IVPB 500 mg (0 mg Intravenous Stopped 12/15/23 2113)  QUEtiapine (SEROQUEL) tablet 25 mg (25 mg Oral Given 12/15/23 2008)    Mobility walks with device     Focused Assessments Neuro Assessment Handoff:  Swallow screen pass? Yes          Neuro Assessment:  Neuro Checks:      Has TPA been given? No If patient is a Neuro Trauma and patient is going to OR before floor call report to 4N Charge nurse:  [phone] or [phone]   R Recommendations: See Admitting Provider Note  Report given to:   Additional Notes: Son accompanying pt.

## 2023-12-16 NOTE — Consult Note (Signed)
WOC Nurse Consult Note: WOC consult requested for necrotic pressure injury to buttock prior to the surgical team involvement.  Pt has an Unstageable pressure injury which was present on admission, according to progress notes.   Surgical team is now following for assessment and plan of care and patient is going to the OR for debridement today.  Please refer to their team for further questions regarding plan of care.  Please re-consult if further assistance is needed.  Thank-you,  Cammie Mcgee MSN, RN, CWOCN, [place], CNS [phone]

## 2023-12-16 NOTE — Progress Notes (Signed)
   PROGRESS NOTE    [name]  XBJ:[id number] DOB: 06/12/1941 DOA: 12/15/2023 PCP: Noberto Retort, MD   Brief Narrative: [name] is a 83 y.o. female with a history of hypertension, hyperlipidemia, dementia.  Patient presented secondary to worsening decubitus ulcer with concern for infection. Empiric antibiotics started and general surgery consulted for debridement.   Assessment and Plan:  Decubitus ulcer of left buttocks Present on admission. Unstageable with eschar. Mildly elevated WBC with concern for associated infection. Empiric Ceftriaxone and Flagyl started on admission. Blood cultures (1/20) obtained on admission. General surgery consulted and plan for debridement in the OR. -General surgery recommendations -Continue Ceftriaxone and Flagyl  Primary hypertension -Resume home amlodipine and losartan  Hyperlipidemia -Continue home Lipitor  Dementia -Continue home Namenda  Insomnia Patient is listed as taking Seroquel and Remeron as needed.   DVT prophylaxis: Lovenox Code Status:   Code Status: Full Code Family Communication: Son on telephone Disposition Plan: Discharge pending ongoing general surgery recommendations, transition to outpatient antibiotics if needed   Consultants:  General surgery  Procedures:  None  Antimicrobials: Ceftriaxone Flagyl    Subjective: Patient reports no concerns. Answering questions inappropriately mostly  Objective: BP 135/89   Pulse 93   Temp 98.1 F (36.7 C)   Resp 16   Ht 5\' 2"  (1.575 m)   Wt 66.7 kg   LMP  (LMP Unknown)   SpO2 98%   BMI 26.89 kg/m   Examination:  General exam: Appears calm and comfortable Respiratory system: Clear to auscultation. Respiratory effort normal. Cardiovascular system: S1 & S2 heard, RRR. No murmurs Gastrointestinal system: Abdomen is nondistended, soft and nontender. Normal bowel sounds heard. Central nervous system: Alert and oriented to person and  place. Musculoskeletal: No edema. No calf tenderness   Data Reviewed: I have personally reviewed following labs and imaging studies  CBC Lab Results  Component Value Date   WBC 12.8 (H) 12/16/2023   RBC 4.55 12/16/2023   HGB 12.8 12/16/2023   HCT 38.9 12/16/2023   MCV 85.5 12/16/2023   MCH 28.1 12/16/2023   PLT 254 12/16/2023   MCHC 32.9 12/16/2023   RDW 15.1 12/16/2023   LYMPHSABS 1.2 12/15/2023   MONOABS 0.6 12/15/2023   EOSABS 0.2 12/15/2023   BASOSABS 0.1 12/15/2023     Last metabolic panel Lab Results  Component Value Date   NA 139 12/16/2023   K 4.1 12/16/2023   CL 107 12/16/2023   CO2 18 (L) 12/16/2023   BUN 6 (L) 12/16/2023   CREATININE 0.61 12/16/2023   GLUCOSE 138 (H) 12/16/2023   GFRNONAA >60 12/16/2023   GFRAA >60 07/31/2017   CALCIUM 8.8 (L) 12/16/2023   PROT 7.4 02/26/2017   ALBUMIN 3.8 02/26/2017   BILITOT 1.4 (H) 02/26/2017   ALKPHOS 102 02/26/2017   AST 28 02/26/2017   ALT 22 02/26/2017   ANIONGAP 14 12/16/2023    GFR: Estimated Creatinine Clearance: 48.5 mL/min (by C-G formula based on SCr of 0.61 mg/dL).  No results found for this or any previous visit (from the past 240 hours).    Radiology Studies: No results found.    LOS: 0 days    Jacquelin Hawking, MD [facility] 12/16/2023, 8:46 AM   If 7PM-7AM, please contact night-coverage [email or web address]

## 2023-12-16 NOTE — Anesthesia Preprocedure Evaluation (Addendum)
Anesthesia Evaluation   Patient confused and Patient unresponsive    Reviewed: Allergy & Precautions, NPO status , Patient's Chart, lab work & pertinent test results, Unable to perform ROS - Chart review only  History of Anesthesia Complications Negative for: history of anesthetic complications  Airway Mallampati: Unable to assess       Dental  (+) Dental Advisory Given   Pulmonary    breath sounds clear to auscultation       Cardiovascular hypertension, Pt. on medications  Rhythm:Regular  - Left ventricle: The cavity size was normal. There was mild focal    basal hypertrophy of the septum. Systolic function was vigorous.    The estimated ejection fraction was in the range of 65% to 70%.    Wall motion was normal; there were no regional wall motion    abnormalities. Doppler parameters are consistent with abnormal    left ventricular relaxation (grade 1 diastolic dysfunction).    Indeterminate mean left atrial filling pressure.  - Aortic valve: There was trivial regurgitation.  - Pulmonary arteries: Systolic pressure was mildly increased. PA    peak pressure: 35 mm Hg (S).     Neuro/Psych       Dementia    GI/Hepatic   Endo/Other    Renal/GU      Musculoskeletal   Abdominal   Peds  Hematology   Anesthesia Other Findings   Reproductive/Obstetrics                              Anesthesia Physical Anesthesia Plan  ASA: 3  Anesthesia Plan: General   Post-op Pain Management:    Induction: Intravenous  PONV Risk Score and Plan: 3 and Ondansetron and Dexamethasone  Airway Management Planned: LMA and Oral ETT  Additional Equipment: None  Intra-op Plan:   Post-operative Plan: Extubation in OR  Informed Consent:      Consent reviewed with POA  Plan Discussed with: CRNA  Anesthesia Plan Comments:          Anesthesia Quick Evaluation

## 2023-12-16 NOTE — Op Note (Signed)
12/16/2023  9:53 AM  PATIENT:  [name]  83 y.o. female  PRE-OPERATIVE DIAGNOSIS: Left  DECUBITUS ULCER  POST-OPERATIVE DIAGNOSIS: Left  DECUBITUS ULCER  PROCEDURE:  Procedure(s): EXCISIONAL DEBRIDMENT of Left DECUBITUS ULCER  SURGEON:  Surgeon(s): [name], MD  ASSISTANTS: none   ANESTHESIA:   local and general  DRAINS: none   EBL: minimal  LOCAL MEDICATIONS USED:  MARCAINE     SPECIMEN:  Source of Specimen:  cultures of wound  DISPOSITION OF SPECIMEN:   micro  COUNTS:  YES  INDICATION FOR PROCEDURE: 83 year old female was brought to the ER yesterday because of worsening decubitus wound to the left buttock.  There is a eschar on the wound as well as a foul odor.  She was very sensitive to touch so recommendation was to debride the wound in the operating room.  Risk and benefits were discussed with the son which are separately documented.  PROCEDURE: After obtaining informed consent the patient was taken to the OR 1 and placed supine on the operating room table.  General LMA anesthesia was established.  Sequential compression devices were placed.  She was then placed in the lateral position with the appropriate padding.  Her buttocks were prepped and draped in the usual standard surgical fashion with Betadine.  The patient was on scheduled therapeutic antibiotics.  A surgical timeout was performed.  The patient had a pre-existing decubitus ulcer on the left buttock.  The area measured 3 x 3 cm.  Using a scalpel I sharply debrided the frankly necrotic skin and necrotic/nonviable soft tissue which ended up being about 2-1/2 x 2.8 x 0.6 cm.  There was some skin, soft tissue and muscle which was sharply debrided with scalpel.  I then used a curette to clean up the wound.  It was not that deep.  It did not track.  Cultures were obtained.  Local was infiltrated around the wound.  I then obtained hemostasis with electrocautery.  No foreign objects to impair healing.  The wound was  packed with a moist 4 x 4 covered with dry gauze and fluff pads.  Mesh underwear was placed.  The patient was placed in supine position then extubated and taken to the recovery room in stable condition.  All needle, instrument, and sponge counts were correct x 2.  There were no immediate complications.     Tool used for debridement (curette, scapel, etc.)  scapel and currette    Frequency of surgical debridement.   Initial debridement  Measurement of total devitalized tissue (wound surface) before and after surgical debridement.   3 x 3 cm before; 2.5 x 2.8 x 0.6cm after   Area and depth of devitalized tissue removed from wound.  2.5 x 2.8 x 0.5 cm removed,     Blood loss and description of tissue removed.  Minimal; skin/soft tissue/some muscle removed  .  Was there any viable tissue removed (measurements): no   PLAN OF CARE:  already inpatient  PATIENT DISPOSITION:  PACU - hemodynamically stable.   Delay start of Pharmacological VTE agent (>24hrs) due to surgical blood loss or risk of bleeding:  no  Mary Sella. Andrey Campanile, MD, FACS General, Bariatric, & Minimally Invasive Surgery [facility] Surgery,  A [facility] practice

## 2023-12-16 NOTE — Transfer of Care (Signed)
Immediate Anesthesia Transfer of Care Note  Patient: [name]  Procedure(s) Performed: EXCISIONAL DEBRIDMENT DECUBITUS ULCER (Left: Buttocks)  Patient Location: PACU  Anesthesia Type:General  Level of Consciousness: awake and sedated  Airway & Oxygen Therapy: Patient Spontanous Breathing and Patient connected to face mask oxygen  Post-op Assessment: Report given to RN and Post -op Vital signs reviewed and stable  Post vital signs: Reviewed and stable  Last Vitals:  Vitals Value Taken Time  BP 124/76 12/16/23 1002  Temp    Pulse 69 12/16/23 1004  Resp 16 12/16/23 1004  SpO2 100 % 12/16/23 1004  Vitals shown include unfiled device data.  Last Pain:  Vitals:   12/16/23 0418  TempSrc: Oral  PainSc:          Complications: No notable events documented.

## 2023-12-17 ENCOUNTER — Encounter (HOSPITAL_COMMUNITY): Payer: Self-pay | Admitting: General Surgery

## 2023-12-17 DIAGNOSIS — L89300 Pressure ulcer of unspecified buttock, unstageable: Secondary | ICD-10-CM | POA: Diagnosis not present

## 2023-12-17 DIAGNOSIS — I10 Essential (primary) hypertension: Secondary | ICD-10-CM | POA: Diagnosis not present

## 2023-12-17 LAB — BASIC METABOLIC PANEL
Anion gap: 8 (ref 5–15)
BUN: 7 mg/dL — ABNORMAL LOW (ref 8–23)
CO2: 22 mmol/L (ref 22–32)
Calcium: 8.3 mg/dL — ABNORMAL LOW (ref 8.9–10.3)
Chloride: 112 mmol/L — ABNORMAL HIGH (ref 98–111)
Creatinine, Ser: 0.62 mg/dL (ref 0.44–1.00)
GFR, Estimated: >60 mL/min (ref >60)
Glucose, Bld: 100 mg/dL — ABNORMAL HIGH (ref 70–99)
Potassium: 3.9 mmol/L (ref 3.5–5.1)
Sodium: 142 mmol/L (ref 135–145)

## 2023-12-17 LAB — CBC
HCT: 38.4 % (ref 36.0–46.0)
Hemoglobin: 12.3 g/dL (ref 12.0–15.0)
MCH: 27.1 pg (ref 26.0–34.0)
MCHC: 32 g/dL (ref 30.0–36.0)
MCV: 84.6 fL (ref 80.0–100.0)
Platelets: 282 10*3/uL (ref 150–400)
RBC: 4.54 MIL/uL (ref 3.87–5.11)
RDW: 14.9 % (ref 11.5–15.5)
WBC: 15.2 10*3/uL — ABNORMAL HIGH (ref 4.0–10.5)
nRBC: 0 % (ref 0.0–0.2)

## 2023-12-17 MED ORDER — QUETIAPINE 12.5 MG HALF TABLET
25.0000 mg | ORAL_TABLET | Freq: Every day | ORAL | Status: DC
  Administered 2023-12-17: 25 mg via ORAL
  Filled 2023-12-17: qty 2

## 2023-12-17 MED ORDER — ENSURE ENLIVE PO LIQD
237.0000 mL | Freq: Two times a day (BID) | ORAL | Status: DC
Start: 2023-12-17 — End: 2023-12-18
  Administered 2023-12-17: 237 mL via ORAL

## 2023-12-17 NOTE — Plan of Care (Signed)
  Problem: Clinical Measurements: Goal: Will remain free from infection Outcome: Progressing   Problem: Nutrition: Goal: Adequate nutrition will be maintained Outcome: Progressing   Problem: Pain Managment: Goal: General experience of comfort will improve and/or be controlled Outcome: Progressing   Problem: Skin Integrity: Goal: Risk for impaired skin integrity will decrease Outcome: Progressing

## 2023-12-17 NOTE — Anesthesia Postprocedure Evaluation (Signed)
Anesthesia Post Note  Patient: [name]  Procedure(s) Performed: EXCISIONAL DEBRIDMENT DECUBITUS ULCER (Left: Buttocks)     Patient location during evaluation: PACU Anesthesia Type: General Level of consciousness: patient cooperative Pain management: pain level controlled Vital Signs Assessment: post-procedure vital signs reviewed and stable Respiratory status: spontaneous breathing, nonlabored ventilation and respiratory function stable Cardiovascular status: blood pressure returned to baseline and stable Postop Assessment: no apparent nausea or vomiting Anesthetic complications: no   No notable events documented.                  Masao Junker

## 2023-12-17 NOTE — Progress Notes (Signed)
1 Day Post-Op  Subjective: Confused.  Has some pain at her backside.  ROS: See above, otherwise other systems negative  Objective: Vital signs in last 24 hours: Temp:  [97.6 F (36.4 C)-98.8 F (37.1 C)] 98.8 F (37.1 C) (01/21 0914) Pulse Rate:  [64-92] 92 (01/21 0914) Resp:  [16-22] 18 (01/21 0914) BP: (106-151)/(70-89) 106/89 (01/21 0914) SpO2:  [95 %-100 %] 99 % (01/21 0914) Last BM Date : 12/15/23  Intake/Output from previous day: 01/20 0701 - 01/21 0700 In: 551.2 [P.O.:50; I.V.:300; IV Piggyback:201.2] Out: 5 [Blood:5] Intake/Output this shift: No intake/output data recorded.  PE: Skin: sacral wound is clean with minimal fibrin present.  Dressing replaced.   Lab Results:  Recent Labs    12/16/23 0427 12/17/23 0849  WBC 12.8* 15.2*  HGB 12.8 12.3  HCT 38.9 38.4  PLT 254 282   BMET Recent Labs    12/16/23 0427 12/17/23 0849  NA 139 142  K 4.1 3.9  CL 107 112*  CO2 18* 22  GLUCOSE 138* 100*  BUN 6* 7*  CREATININE 0.61 0.62  CALCIUM 8.8* 8.3*   PT/INR No results for input(s): "LABPROT", "INR" in the last 72 hours. CMP     Component Value Date/Time   NA 142 12/17/2023 0849   K 3.9 12/17/2023 0849   CL 112 (H) 12/17/2023 0849   CO2 22 12/17/2023 0849   GLUCOSE 100 (H) 12/17/2023 0849   BUN 7 (L) 12/17/2023 0849   CREATININE 0.62 12/17/2023 0849   CALCIUM 8.3 (L) 12/17/2023 0849   PROT 7.4 02/26/2017 1823   ALBUMIN 3.8 02/26/2017 1823   AST 28 02/26/2017 1823   ALT 22 02/26/2017 1823   ALKPHOS 102 02/26/2017 1823   BILITOT 1.4 (H) 02/26/2017 1823   GFRNONAA >60 12/17/2023 0849   GFRAA >60 07/31/2017 0152   Lipase     Component Value Date/Time   LIPASE 19 02/26/2017 1823       Studies/Results: No results found.  Anti-infectives: Anti-infectives (From admission, onward)    Start     Dose/Rate Route Frequency Ordered Stop   12/16/23 0800  metroNIDAZOLE (FLAGYL) IVPB 500 mg        500 mg 100 mL/hr over 60 Minutes  Intravenous Every 12 hours 12/15/23 2205     12/16/23 0500  cefTRIAXone (ROCEPHIN) 1 g in sodium chloride 0.9 % 100 mL IVPB        1 g 200 mL/hr over 30 Minutes Intravenous Every 24 hours 12/15/23 2205 12/23/23 0459   12/15/23 1845  ceFEPIme (MAXIPIME) 2 g in sodium chloride 0.9 % 100 mL IVPB        2 g 200 mL/hr over 30 Minutes Intravenous  Once 12/15/23 1837 12/15/23 2002   12/15/23 1845  metroNIDAZOLE (FLAGYL) IVPB 500 mg        500 mg 100 mL/hr over 60 Minutes Intravenous  Once 12/15/23 1837 12/15/23 2113        Assessment/Plan POD 1, s/p debridement of sacral wound, Dr. Andrey Campanile, 1/20 -wound is now clean after debridement yesterday -NS WD dressing changes to wound BID to sacral wound -no evidence of infection -local wound care as above.  She can follow up with the wound center as needed for further wound care -we will sign off at this time.  D/w primary service.   I reviewed last 24 h vitals and pain scores, last 48 h intake and output, last 24 h labs and trends, and last 24 h imaging results.  LOS: 1 day    Letha Cape , [facility] 12/17/2023, 10:06 AM Please see Amion for pager number during day hours 7:00am-4:30pm or 7:00am -11:30am on weekends

## 2023-12-17 NOTE — Progress Notes (Signed)
PROGRESS NOTE    [name]  WUJ:[id number] DOB: Apr 22, 1941 DOA: 12/15/2023 PCP: Noberto Retort, MD   Brief Narrative: [name] is a 83 y.o. female with a history of hypertension, hyperlipidemia, dementia.  Patient presented secondary to worsening decubitus ulcer with concern for infection. Empiric antibiotics started and general surgery consulted for debridement.   Assessment and Plan:  Decubitus ulcer of left buttocks Present on admission. Unstageable with eschar. Mildly elevated WBC with concern for associated infection. Empiric Ceftriaxone and Flagyl started on admission. Blood cultures (1/20) obtained on admission. General surgery consulted and performed debridement on 1/20. WBC slightly worsened today in setting of recent surgery -General surgery recommendations: normal saline wet-dry dressing changes BID -Continue Ceftriaxone and Flagyl -Follow-up blood and wound culture data -CBC in AM  Primary hypertension -Resume home amlodipine and losartan  Hyperlipidemia -Continue home Lipitor  Dementia -Continue home Namenda  Insomnia Patient is listed as taking Seroquel and Remeron as needed.   DVT prophylaxis: Lovenox Code Status:   Code Status: Full Code Family Communication: Son on telephone (1/21) Disposition Plan: Discharge home in AM if leukocytosis stabilizes after surgical procedure and pending culture data   Consultants:  General surgery  Procedures:  None  Antimicrobials: Ceftriaxone Flagyl    Subjective: No issues this morning.  Objective: BP 106/89 (BP Location: Left Arm)   Pulse 92   Temp 98.8 F (37.1 C) (Axillary)   Resp 18   Ht 5\' 2"  (1.575 m)   Wt 66.7 kg   LMP  (LMP Unknown)   SpO2 99%   BMI 26.89 kg/m   Examination:  General exam: Appears calm and comfortable Respiratory system: Clear to auscultation. Respiratory effort normal. Cardiovascular system: S1 & S2 heard, RRR. Gastrointestinal system: Abdomen is  nondistended, soft and nontender. Normal bowel sounds heard. Central nervous system: Alert.    Data Reviewed: I have personally reviewed following labs and imaging studies  CBC Lab Results  Component Value Date   WBC 15.2 (H) 12/17/2023   RBC 4.54 12/17/2023   HGB 12.3 12/17/2023   HCT 38.4 12/17/2023   MCV 84.6 12/17/2023   MCH 27.1 12/17/2023   PLT 282 12/17/2023   MCHC 32.0 12/17/2023   RDW 14.9 12/17/2023   LYMPHSABS 1.2 12/15/2023   MONOABS 0.6 12/15/2023   EOSABS 0.2 12/15/2023   BASOSABS 0.1 12/15/2023     Last metabolic panel Lab Results  Component Value Date   NA 142 12/17/2023   K 3.9 12/17/2023   CL 112 (H) 12/17/2023   CO2 22 12/17/2023   BUN 7 (L) 12/17/2023   CREATININE 0.62 12/17/2023   GLUCOSE 100 (H) 12/17/2023   GFRNONAA >60 12/17/2023   GFRAA >60 07/31/2017   CALCIUM 8.3 (L) 12/17/2023   PROT 7.4 02/26/2017   ALBUMIN 3.8 02/26/2017   BILITOT 1.4 (H) 02/26/2017   ALKPHOS 102 02/26/2017   AST 28 02/26/2017   ALT 22 02/26/2017   ANIONGAP 8 12/17/2023    GFR: Estimated Creatinine Clearance: 48.5 mL/min (by C-G formula based on SCr of 0.62 mg/dL).  Recent Results (from the past 240 hours)  Blood culture (routine x 2)     Status: None (Preliminary result)   Collection Time: 12/15/23  6:36 PM   Specimen: BLOOD  Result Value Ref Range Status   Specimen Description BLOOD LEFT ANTECUBITAL  Final   Special Requests   Final    BOTTLES DRAWN AEROBIC AND ANAEROBIC Blood Culture results may not be optimal due to an  inadequate volume of blood received in culture bottles   Culture   Final    NO GROWTH 2 DAYS Performed at [facility], [address] [place], [place] [address]    Report Status PENDING  Incomplete  Blood culture (routine x 2)     Status: None (Preliminary result)   Collection Time: 12/16/23  4:27 AM   Specimen: BLOOD  Result Value Ref Range Status   Specimen Description BLOOD RIGHT ANTECUBITAL  Final   Special Requests    Final    BOTTLES DRAWN AEROBIC AND ANAEROBIC Blood Culture results may not be optimal due to an inadequate volume of blood received in culture bottles   Culture   Final    NO GROWTH 1 DAY Performed at [facility], [address] [place], [place] [address]    Report Status PENDING  Incomplete  Aerobic/Anaerobic Culture w Gram Stain (surgical/deep wound)     Status: None (Preliminary result)   Collection Time: 12/16/23  9:43 AM   Specimen: PATH Skin excision; Tissue  Result Value Ref Range Status   Specimen Description WOUND  Final   Special Requests LEFT DU  Final   Gram Stain   Final    RARE WBC PRESENT, PREDOMINANTLY PMN NO ORGANISMS SEEN    Culture   Final    CULTURE REINCUBATED FOR BETTER GROWTH Performed at [facility], [address], [place], [place] [address]    Report Status PENDING  Incomplete      Radiology Studies: No results found.    LOS: 1 day    Jacquelin Hawking, MD [facility] 12/17/2023, 2:01 PM   If 7PM-7AM, please contact night-coverage [email or web address]

## 2023-12-17 NOTE — TOC Initial Note (Signed)
Transition of Care [facility]) - Initial/Assessment Note    Patient Details  Name: [name] MRN: [id number] Date of Birth: 1941-01-20  Transition of Care [facility]) CM/SW Contact:    Carley Hammed, LCSW Phone Number: 12/17/2023, 1:55 PM  Clinical Narrative:                  CSW was advised by PT that pt is likely near her baseline, but unable to determine what her care level or set up is at home due to pt's disorientation. CSW spoke with son [name] who notes that pt lives with him and his wife. She has her own space but requires assistance with bathing, mobility and ADL's. CSW discussed disposition and son states they would prefer for pt to return home. Pt has [facility] for Wound care, agreeable to additional services. Son will transport home at DC. Cm consulted for additional needs. TOC will continue to follow. Expected Discharge Plan: Home w Home Health Services Barriers to Discharge: Continued Medical Work up   Patient Goals and CMS Choice Patient states their goals for this hospitalization and ongoing recovery are:: Pt disoriented, unable to participate in goal setting.   Choice offered to / list presented to : Adult Children      Expected Discharge Plan and Services   Discharge Planning Services: CM Consult Post Acute Care Choice: Home Health Living arrangements for the past 2 months: Single Family Home                                      Prior Living Arrangements/Services Living arrangements for the past 2 months: Single Family Home Lives with:: Adult Children Patient language and need for interpreter reviewed:: Yes Do you feel safe going back to the place where you live?: Yes      Need for Family Participation in Patient Care: Yes (Comment) Care giver support system in place?: Yes (comment) Current home services: DME, Homehealth aide Criminal Activity/Legal Involvement Pertinent to Current Situation/Hospitalization: No - Comment as needed  Activities of Daily  Living   ADL Screening (condition at time of admission) Independently performs ADLs?: No Does the patient have a NEW difficulty with bathing/dressing/toileting/self-feeding that is expected to last >3 days?: Yes (Initiates electronic notice to provider for possible OT consult) Does the patient have a NEW difficulty with getting in/out of bed, walking, or climbing stairs that is expected to last >3 days?: Yes (Initiates electronic notice to provider for possible PT consult) Does the patient have a NEW difficulty with communication that is expected to last >3 days?: Yes (Initiates electronic notice to provider for possible SLP consult) Is the patient deaf or have difficulty hearing?: No Does the patient have difficulty seeing, even when wearing glasses/contacts?: No Does the patient have difficulty concentrating, remembering, or making decisions?: Yes  Permission Sought/Granted Permission sought to share information with : Family Supports Permission granted to share information with : Yes, Verbal Permission Granted  Share Information with NAME: [name]     Permission granted to share info w Relationship: son     Emotional Assessment Appearance:: Appears stated age Attitude/Demeanor/Rapport: Unable to Assess Affect (typically observed): Unable to Assess Orientation: : Oriented to Self Alcohol / Substance Use: Not Applicable Psych Involvement: No (comment)  Admission diagnosis:  Decubitus ulcer of buttock [L89.309] Skin ulcer of sacrum with fat layer exposed [facility]) [K44.010] Patient Active Problem List   Diagnosis Date Noted  Decubitus ulcer of buttock 12/15/2023   HYPERCHOLESTEROLEMIA 01/23/2007   HYPERTENSION, BENIGN SYSTEMIC 01/23/2007   BRADYCARDIA 01/23/2007   OSTEOPOROSIS, UNSPECIFIED 01/23/2007   INCONTINENCE, URGE 01/23/2007   PCP:  Noberto Retort, MD Pharmacy:   [facility] [phone] Gin[place] Otto,  - [address] [place]  [address] Phone: [phone] Fax: [phone]     Social Drivers of Health (SDOH) Social History: SDOH Screenings   Food Insecurity: Patient Unable To Answer (12/16/2023)  Housing: Patient Unable To Answer (12/16/2023)  Transportation Needs: Patient Unable To Answer (12/16/2023)  Utilities: Patient Unable To Answer (12/16/2023)  Social Connections: Unknown (12/16/2023)  Tobacco Use: Low Risk  (12/16/2023)   SDOH Interventions:     Readmission Risk Interventions     No data to display

## 2023-12-17 NOTE — TOC CM/SW Note (Signed)
    Durable Medical Equipment  (From admission, onward)           Start     Ordered   12/17/23 1409  For home use only DME Hospital bed  Once       Comments: Low air loss mattress  Contact son Susy [place] [phone]  Question Answer Comment  Length of Need Lifetime   Patient has (list medical condition): EXCISIONAL DEBRIDMENT of Left  unstableable with eschar DECUBITUS ULCER   The above medical condition requires: Patient requires the ability to reposition immediately   Head must be elevated greater than: 45 degrees   Bed type Semi-electric   Support Surface: Low Air loss Mattress      12/17/23 1410

## 2023-12-17 NOTE — TOC Progression Note (Signed)
Transition of Care (TOC) - Progression Note   Spoke to patient and son [name] at bedside.   [name] prefers to take his mom home at discharge with [facility]. She is currently active with [facility] for [facility] will add HHPT. [name] with [facility] accepted referral to continue [facility] and add HHPT. NCM entered orders and asked MD to sign.   PT recommending hospital bed with air overlay mattress . [name] in agreement and he is contact person for delivery. Order entered and asked MD to sign. Ordered same with Jermaine with [employer] .   Patient already has walker and wheelchair at home.  Patient Details  Name: [name] MRN: [id number] Date of Birth: 1941/05/08  Transition of Care [facility]) CM/SW Contact  Josely Moffat, Adria Devon, RN Phone Number: 12/17/2023, 2:19 PM  Clinical Narrative:       Expected Discharge Plan: Home w Home Health Services Barriers to Discharge: Continued Medical Work up  Expected Discharge Plan and Services   Discharge Planning Services: CM Consult Post Acute Care Choice: Home Health Living arrangements for the past 2 months: Single Family Home                 DME Arranged: Hospital bed (air over lay mattress) DME Agency: [employer] Date DME Agency Contacted: 12/17/23 Time DME Agency Contacted: [phone] Representative spoke with at DME Agency: Vaughan Basta HH Arranged: RN, PT HH Agency: [facility] ([facility]) Date [facility] Contacted: 12/17/23 Time HH Agency Contacted: [phone] Representative spoke with at [facility] Agency: [name]   Social Determinants of Health (SDOH) Interventions SDOH Screenings   Food Insecurity: Patient Unable To Answer (12/16/2023)  Housing: Patient Unable To Answer (12/16/2023)  Transportation Needs: Patient Unable To Answer (12/16/2023)  Utilities: Patient Unable To Answer (12/16/2023)  Social Connections: Unknown (12/16/2023)  Tobacco Use: Low Risk  (12/16/2023)    Readmission Risk Interventions     No data to display

## 2023-12-17 NOTE — Evaluation (Addendum)
Physical Therapy Evaluation  Patient Details Name: [name] MRN: [id number] DOB: 04/18/1941 Today's Date: 12/17/2023  History of Present Illness  Pt is an 83 y.o. female who presents 12/15/2023 with chronic sacral wound with increased pain and drainage. Pt s/p OR debridement 12/16/2023. PMH significant for dementia, heart murmur, HTN, osteoporosis, ORIF ankle fx, wrist surgery.   Clinical Impression  Pt admitted with above diagnosis.  Pt currently with functional limitations due to the deficits listed below (see PT Problem List). At the time of PT eval pt was able to perform transfers with up to +2 max HHA for safe transition bed>chair. Pt unable to provide any information regarding home set up, PLOF, or caregiver availability. Recommend pt have 24 hour assist at d/c. Reached out to [facility] regarding plan. From a cognitive standpoint, pt would benefit from being at home with family support. Pt will benefit from acute skilled PT to increase their independence and safety with mobility to allow discharge.      Addendum 1350: TOC was able to get in touch with the family. Per their discussion, pt is from home with son and his wife. She has her own room but gets bathing and most care from family. She has a walker, wheelchair and hospital bed. Recommend an air mattress overlay for hospital bed to protect skin integrity and HHPT to follow up at d/c.       If plan is discharge home, recommend the following: Two people to help with walking and/or transfers;Two people to help with bathing/dressing/bathroom;Assistance with cooking/housework;Assist for transportation;Supervision due to cognitive status;Help with stairs or ramp for entrance   Can travel by private vehicle   No    Equipment Recommendations Wheelchair (measurements PT);Wheelchair cushion (measurements PT);Rolling walker (2 wheels);Hospital bed (with air matress overlay)  Recommendations for Other Services       Functional Status Assessment  Patient has had a recent decline in their functional status and demonstrates the ability to make significant improvements in function in a reasonable and predictable amount of time.     Precautions / Restrictions Precautions Precautions: Fall Restrictions Weight Bearing Restrictions Per Provider Order: No      Mobility  Bed Mobility Overal bed mobility: Needs Assistance Bed Mobility: Supine to Sit     Supine to sit: Mod assist, +2 for physical assistance     General bed mobility comments: Assist for LE advancement towards EOB and for trunk elevation to full sitting position.    Transfers Overall transfer level: Needs assistance Equipment used: 2 person hand held assist Transfers: Sit to/from Stand, Bed to chair/wheelchair/BSC Sit to Stand: Max assist, +2 physical assistance, From elevated surface Stand pivot transfers: Mod assist, +2 physical assistance, From elevated surface         General transfer comment: +2 max assist to initiate power up to full stand. +2 mod assist for pivot around from bed>chair.    Ambulation/Gait               General Gait Details: Unable to progress to gait training at this time.  Stairs            Wheelchair Mobility     Tilt Bed    Modified Rankin (Stroke Patients Only)       Balance Overall balance assessment: Needs assistance Sitting-balance support: Feet supported, No upper extremity supported Sitting balance-Leahy Scale: Fair     Standing balance support: Bilateral upper extremity supported, During functional activity Standing balance-Leahy Scale: Poor  Pertinent Vitals/Pain Pain Assessment Pain Assessment: Faces Faces Pain Scale: Hurts even more Pain Location: L buttock at wound site Pain Descriptors / Indicators: Grimacing, Operative site guarding Pain Intervention(s): Limited activity within patient's tolerance, Monitored during session, Repositioned    Home  Living Family/patient expects to be discharged to:: Private residence Living Arrangements: Children (Adult son)                 Additional Comments: Pt is a poor historian due to dementia. Unable to gain any meaningful information about home set up or PLOF    Prior Function Prior Level of Function : Patient poor historian/Family not available                     Extremity/Trunk Assessment   Upper Extremity Assessment Upper Extremity Assessment: Generalized weakness    Lower Extremity Assessment Lower Extremity Assessment: Defer to PT evaluation    Cervical / Trunk Assessment Cervical / Trunk Assessment: Kyphotic (Forward head posture with rounded shoulders)  Communication   Communication Communication: No apparent difficulties Cueing Techniques: Verbal cues;Gestural cues;Tactile cues  Cognition Arousal: Alert Behavior During Therapy: Restless Overall Cognitive Status: History of cognitive impairments - at baseline                                          General Comments General comments (skin integrity, edema, etc.): VSS on RA    Exercises     Assessment/Plan    PT Assessment Patient needs continued PT services  PT Problem List Decreased strength;Decreased activity tolerance;Decreased balance;Decreased mobility;Decreased cognition;Decreased knowledge of use of DME;Decreased safety awareness;Decreased knowledge of precautions;Pain       PT Treatment Interventions DME instruction;Gait training;Stair training;Therapeutic activities;Functional mobility training;Therapeutic exercise;Balance training;Patient/family education    PT Goals (Current goals can be found in the Care Plan section)  Acute Rehab PT Goals Patient Stated Goal: None stated PT Goal Formulation: Patient unable to participate in goal setting Time For Goal Achievement: 12/31/23 Potential to Achieve Goals: Good    Frequency Min 1X/week     Co-evaluation PT/OT/SLP  Co-Evaluation/Treatment: Yes Reason for Co-Treatment: Complexity of the patient's impairments (multi-system involvement);Necessary to address cognition/behavior during functional activity;For patient/therapist safety;To address functional/ADL transfers PT goals addressed during session: Mobility/safety with mobility;Balance;Strengthening/ROM OT goals addressed during session: ADL's and self-care;Strengthening/ROM       AM-PAC PT "6 Clicks" Mobility  Outcome Measure Help needed turning from your back to your side while in a flat bed without using bedrails?: Total Help needed moving from lying on your back to sitting on the side of a flat bed without using bedrails?: Total Help needed moving to and from a bed to a chair (including a wheelchair)?: Total Help needed standing up from a chair using your arms (e.g., wheelchair or bedside chair)?: Total Help needed to walk in hospital room?: Total Help needed climbing 3-5 steps with a railing? : Total 6 Click Score: 6    End of Session Equipment Utilized During Treatment: Gait belt Activity Tolerance: Patient tolerated treatment well Patient left: in chair;with call bell/phone within reach;with chair alarm set Nurse Communication: Mobility status PT Visit Diagnosis: Unsteadiness on feet (R26.81);Pain Pain - Right/Left: Left Pain - part of body:  (buttock)    Time: 9604-5409 PT Time Calculation (min) (ACUTE ONLY): 26 min   Charges:   PT Evaluation $PT Eval Moderate Complexity: 1 Mod  PT General Charges $$ ACUTE PT VISIT: 1 Visit         Conni Slipper, PT, DPT Acute Rehabilitation Services Secure Chat Preferred Office: [phone]   Marylynn Pearson 12/17/2023, 1:49 PM

## 2023-12-17 NOTE — Evaluation (Signed)
Occupational Therapy Evaluation Patient Details Name: [name] MRN: [id number] DOB: [birth year] Today's Date: 12/17/2023   History of Present Illness Pt is an 83 y.o. female who presents 12/15/2023 with chronic sacral wound with increased pain and drainage. Pt s/p OR debridement 12/16/2023. PMH significant for dementia, heart murmur, HTN, osteoporosis, ORIF ankle fx, wrist surgery.   Clinical Impression   Patient is s/p OR debridement surgery resulting in functional limitations due to the deficits listed below (see OT Problem List). Pt unable to provide any background information or prior level of functioning due to cognitive deficits. Medical chart does state that pt lives with her son. Patient will benefit from continued inpatient follow up therapy, <3 hours/day. Patient will benefit from acute skilled OT to increase their safety and independence with ADL and functional mobility for ADL to allow facilitate discharge. Pt is more than likely close to baseline level of function although OT will continue to follow pt acutely.         If plan is discharge home, recommend the following: A lot of help with walking and/or transfers;A lot of help with bathing/dressing/bathroom;Supervision due to cognitive status    Functional Status Assessment  Patient has had a recent decline in their functional status and demonstrates the ability to make significant improvements in function in a reasonable and predictable amount of time.  Equipment Recommendations   (TBD/defer to next venue of care)       Precautions / Restrictions Precautions Precautions: Fall Restrictions Weight Bearing Restrictions Per Provider Order: No      Mobility Bed Mobility Overal bed mobility: Needs Assistance Bed Mobility: Supine to Sit     Supine to sit: Mod assist, +2 for physical assistance     General bed mobility comments: Assist for LE advancement towards EOB and for trunk elevation to full sitting position.     Transfers Overall transfer level: Needs assistance Equipment used: 2 person hand held assist Transfers: Sit to/from Stand, Bed to chair/wheelchair/BSC Sit to Stand: Max assist, +2 physical assistance, From elevated surface Stand pivot transfers: Mod assist, +2 physical assistance, From elevated surface         General transfer comment: +2 max assist to initiate power up to full stand. +2 mod assist for pivot around from bed>chair.      Balance Overall balance assessment: Needs assistance Sitting-balance support: Feet supported, No upper extremity supported Sitting balance-Leahy Scale: Fair     Standing balance support: Bilateral upper extremity supported, During functional activity Standing balance-Leahy Scale: Poor        ADL either performed or assessed with clinical judgement   ADL     General ADL Comments: Pt's baseline unknown. At this time, pt requires cueing due to cognitive deficits. Able to complete self-feeding and grooming with set-up. Min A for UB ADL, and Max-total A for LB ADL.     Vision Baseline Vision/History: 0 No visual deficits Ability to See in Adequate Light: 0 Adequate Patient Visual Report: No change from baseline Vision Assessment?: No apparent visual deficits     Perception Perception: Not tested       Praxis Praxis: Not tested       Pertinent Vitals/Pain Pain Assessment Pain Assessment: Faces Faces Pain Scale: Hurts even more Pain Location: L buttock at wound site Pain Descriptors / Indicators: Grimacing, Operative site guarding Pain Intervention(s): Limited activity within patient's tolerance, Monitored during session, Repositioned     Extremity/Trunk Assessment Upper Extremity Assessment Upper Extremity Assessment: Generalized weakness  Lower Extremity Assessment Lower Extremity Assessment: Defer to PT evaluation   Cervical / Trunk Assessment Cervical / Trunk Assessment: Kyphotic (Forward head posture with rounded  shoulders)   Communication Communication Communication: No apparent difficulties Cueing Techniques: Verbal cues;Gestural cues;Tactile cues   Cognition Arousal: Alert Behavior During Therapy: Restless, Anxious Overall Cognitive Status: History of cognitive impairments - at baseline      General Comments: Anxious during  bed mobility and transfer due to pain and fear of falling     General Comments  VSS on RA            Home Living Family/patient expects to be discharged to:: Private residence Living Arrangements: Children (Adult son)     Additional Comments: Pt is a poor historian due to dementia. Unable to gain any meaningful information about home set up or PLOF      Prior Functioning/Environment Prior Level of Function : Patient poor historian/Family not available         OT Problem List: Decreased strength;Impaired balance (sitting and/or standing)      OT Treatment/Interventions: Self-care/ADL training;Therapeutic exercise;DME and/or AE instruction;Therapeutic activities;Patient/family education;Balance training    OT Goals(Current goals can be found in the care plan section) Acute Rehab OT Goals Patient Stated Goal: None stated OT Goal Formulation: Patient unable to participate in goal setting Time For Goal Achievement: 01/02/24 Potential to Achieve Goals: Fair  OT Frequency: Min 1X/week    Co-evaluation PT/OT/SLP Co-Evaluation/Treatment: Yes Reason for Co-Treatment: Complexity of the patient's impairments (multi-system involvement);Necessary to address cognition/behavior during functional activity;For patient/therapist safety;To address functional/ADL transfers PT goals addressed during session: Mobility/safety with mobility;Balance;Strengthening/ROM OT goals addressed during session: ADL's and self-care;Strengthening/ROM      AM-PAC OT "6 Clicks" Daily Activity     Outcome Measure Help from another person eating meals?: A Little Help from another person  taking care of personal grooming?: A Little Help from another person toileting, which includes using toliet, bedpan, or urinal?: Total Help from another person bathing (including washing, rinsing, drying)?: A Lot Help from another person to put on and taking off regular upper body clothing?: A Little Help from another person to put on and taking off regular lower body clothing?: Total 6 Click Score: 13   End of Session Equipment Utilized During Treatment: Gait belt Nurse Communication: Mobility status  Activity Tolerance: Patient tolerated treatment well Patient left: in chair;with call bell/phone within reach;with chair alarm set (safety belt posey placed)  OT Visit Diagnosis: Unsteadiness on feet (R26.81);Muscle weakness (generalized) (M62.81)                Time: 1610-9604 OT Time Calculation (min): 27 min Charges:  OT General Charges $OT Visit: 1 Visit OT Evaluation $OT Eval Moderate Complexity: 1 Mod  [employer], OTR/L,CBIS  Supplemental OT - [facility] and [facility] Secure Chat Preferred    Kaito Schulenburg, Charisse March 12/17/2023, 1:35 PM

## 2023-12-18 DIAGNOSIS — I10 Essential (primary) hypertension: Secondary | ICD-10-CM | POA: Diagnosis not present

## 2023-12-18 DIAGNOSIS — L89300 Pressure ulcer of unspecified buttock, unstageable: Secondary | ICD-10-CM | POA: Diagnosis not present

## 2023-12-18 DIAGNOSIS — E7800 Pure hypercholesterolemia, unspecified: Secondary | ICD-10-CM | POA: Diagnosis not present

## 2023-12-18 LAB — CBC WITH DIFFERENTIAL/PLATELET
Abs Immature Granulocytes: 0.11 10*3/uL — ABNORMAL HIGH (ref 0.00–0.07)
Basophils Absolute: 0.1 10*3/uL (ref 0.0–0.1)
Basophils Relative: 1 %
Eosinophils Absolute: 0.2 10*3/uL (ref 0.0–0.5)
Eosinophils Relative: 2 %
HCT: 37.5 % (ref 36.0–46.0)
Hemoglobin: 12.3 g/dL (ref 12.0–15.0)
Immature Granulocytes: 1 %
Lymphocytes Relative: 12 %
Lymphs Abs: 1.4 10*3/uL (ref 0.7–4.0)
MCH: 27.7 pg (ref 26.0–34.0)
MCHC: 32.8 g/dL (ref 30.0–36.0)
MCV: 84.5 fL (ref 80.0–100.0)
Monocytes Absolute: 0.8 10*3/uL (ref 0.1–1.0)
Monocytes Relative: 8 %
Neutro Abs: 8.5 10*3/uL — ABNORMAL HIGH (ref 1.7–7.7)
Neutrophils Relative %: 76 %
Platelets: 286 10*3/uL (ref 150–400)
RBC: 4.44 MIL/uL (ref 3.87–5.11)
RDW: 15.1 % (ref 11.5–15.5)
WBC: 11.1 10*3/uL — ABNORMAL HIGH (ref 4.0–10.5)
nRBC: 0 % (ref 0.0–0.2)

## 2023-12-18 MED ORDER — ACETAMINOPHEN 500 MG PO TABS: 500.0000 mg | ORAL_TABLET | Freq: Four times a day (QID) | ORAL | 0 refills | Status: AC | PRN

## 2023-12-18 MED ORDER — AMOXICILLIN-POT CLAVULANATE 875-125 MG PO TABS: 1.0000 | ORAL_TABLET | Freq: Two times a day (BID) | ORAL | 0 refills | Status: AC

## 2023-12-18 NOTE — TOC Progression Note (Addendum)
Transition of Care (TOC) - Progression Note   Received a call from [employer] , they called son to arrange delivery of hospital bed and air mattress , he declined delivery until tomorrow   Spoke to Ai on phone. He plans to be at the hospital today at 12:30 pm to pick his mom up and take home. He states he does not need the hospital bed for her tonight. Sent Attending and bedside nurse secure chat.  [name] with [facility] aware  Patient Details  Name: [name] MRN: [id number] Date of Birth: [birth year]  Transition of Care [facility]) CM/SW Contact  Ninamarie Keel, Adria Devon, RN Phone Number: 12/18/2023, 10:01 AM  Clinical Narrative:       Expected Discharge Plan: Home w Home Health Services Barriers to Discharge: Continued Medical Work up  Expected Discharge Plan and Services   Discharge Planning Services: CM Consult Post Acute Care Choice: Home Health Living arrangements for the past 2 months: Single Family Home                 DME Arranged: Hospital bed (air over lay mattress) DME Agency: [employer] Date DME Agency Contacted: 12/17/23 Time DME Agency Contacted: [phone] Representative spoke with at DME Agency: Vaughan Basta HH Arranged: RN, PT HH Agency: [facility] ([facility]) Date [facility] Agency Contacted: 12/17/23 Time HH Agency Contacted: 1418 Representative spoke with at [facility] Agency: [name]   Social Determinants of Health (SDOH) Interventions SDOH Screenings   Food Insecurity: Patient Unable To Answer (12/16/2023)  Housing: Patient Unable To Answer (12/16/2023)  Transportation Needs: Patient Unable To Answer (12/16/2023)  Utilities: Patient Unable To Answer (12/16/2023)  Social Connections: Unknown (12/16/2023)  Tobacco Use: Low Risk  (12/16/2023)    Readmission Risk Interventions     No data to display

## 2023-12-18 NOTE — Discharge Summary (Signed)
Physician Discharge Summary   Patient: [name] MRN: [id number] DOB: 1941-04-15  Admit date:     12/15/2023  Discharge date: 12/18/23  Discharge Physician: Debarah Crape   PCP: Noberto Retort, MD   Recommendations at discharge:    Follow up with PCP and wound care clinic  Discharge Diagnoses: Principal Problem:   Decubitus ulcer of buttock Active Problems:   HYPERCHOLESTEROLEMIA   HYPERTENSION, BENIGN SYSTEMIC  Resolved Problems:   * No resolved hospital problems. [facility] Course: [name] is an 83 year old female with history of hypertension, hyperlipidemia, dementia, who presented with worsening decubitus ulcer concerning for infection.  Empiric antibiotics were were initiated and general surgery was consulted for debridement.  Patient underwent debridement on 1/20.  General surgery recommends continued saline wet-to-dry dressing changes twice daily.  Blood and wound cultures negative x 48 hours, WBC trended downward.  She will need to complete an additional 5 days of antibiotics at discharge.  The patients son, [name] requested patient discharge home with home health.  TOC was consulted to assist in these arrangements.       Consultants: General Surgery Procedures performed: Debridement  Disposition: Home health Diet recommendation:  Discharge Diet Orders (From admission, onward)     Start     Ordered   12/18/23 0000  Diet full liquid        12/18/23 1128           Full liquid diet DISCHARGE MEDICATION: Allergies as of 12/18/2023       Reactions   Aspirin Nausea And Vomiting   High doses        Medication List     STOP taking these medications    amLODipine 2.5 MG tablet Commonly known as: NORVASC   aspirin 81 MG tablet   CORICIDIN D PO   irbesartan 150 MG tablet Commonly known as: AVAPRO   nitrofurantoin 50 MG capsule Commonly known as: MACRODANTIN       TAKE these medications    acetaminophen 500 MG tablet Commonly known  as: TYLENOL Take 1 tablet (500 mg total) by mouth every 6 (six) hours as needed for fever. What changed: how much to take   amoxicillin-clavulanate 875-125 MG tablet Commonly known as: AUGMENTIN Take 1 tablet by mouth 2 (two) times daily for 5 days.   atorvastatin 40 MG tablet Commonly known as: LIPITOR Take 40 mg by mouth daily. What changed: Another medication with the same name was removed. Continue taking this medication, and follow the directions you see here.   calcium-vitamin D 250-125 MG-UNIT tablet Commonly known as: OSCAL Take 1 tablet by mouth daily.   ibuprofen 200 MG tablet Commonly known as: ADVIL Take 200 mg by mouth every 6 (six) hours as needed for moderate pain.   memantine 10 MG tablet Commonly known as: NAMENDA Take 1 tablet (10 mg total) by mouth 2 (two) times daily.   mirtazapine 7.5 MG tablet Commonly known as: REMERON Take 1 tablet (7.5 mg total) by mouth at bedtime as needed for sleep   multivitamin tablet Take 1 tablet by mouth daily.   QUEtiapine 25 MG tablet Commonly known as: SEROQUEL Take 1 tablet (25 mg total) by mouth at bedtime as needed for sleep/confusion. What changed: when to take this   solifenacin 5 MG tablet Commonly known as: VESICARE Take 1 tablet (5 mg total) by mouth every evening.   triamcinolone cream 0.1 % Commonly known as: KENALOG Apply 1 Application topically daily as needed (for  itching).               Durable Medical Equipment  (From admission, onward)           Start     Ordered   12/17/23 1409  For home use only DME Hospital bed  Once       Comments: Low air loss mattress  Contact son [place] [phone]  Question Answer Comment  Length of Need Lifetime   Patient has (list medical condition): EXCISIONAL DEBRIDMENT of Left  unstableable with eschar DECUBITUS ULCER   The above medical condition requires: Patient requires the ability to reposition immediately   Head must be elevated greater  than: 45 degrees   Bed type Semi-electric   Support Surface: Low Air loss Mattress      12/17/23 1410              Discharge Care Instructions  (From admission, onward)           Start     Ordered   12/18/23 0000  Change dressing (specify)       Comments: Sacral ulcer: Normal Saline wet to dry dressings, BID.   12/18/23 1128            Follow-up Information     [facility] Follow up.   Specialty: Home Health Services Why: [facility] Contact information: [address] [place] [address] [phone]                Discharge Exam: Filed Weights   12/15/23 1431 12/16/23 0827  Weight: 66.7 kg 66.7 kg   Constitutional:  Normal appearance. Non toxic-appearing.  HENT: Head Normocephalic and atraumatic.  Mucous membranes are moist.  Eyes:  Extraocular intact. Conjunctivae normal. Pupils are equal, round, and reactive to light.  Cardiovascular: Rate and Rhythm: Normal rate and regular rhythm.  Pulmonary: Non labored, symmetric rise of chest wall.  Musculoskeletal:  Normal range of motion.  Skin: warm and dry. not jaundiced.  Neurological: Alert, disoriented, speech is nonsensical, is not consistently follow commands Psychiatric: calm    Condition at discharge: stable  The results of significant diagnostics from this hospitalization (including imaging, microbiology, ancillary and laboratory) are listed below for reference.   Imaging Studies: CT PELVIS WO CONTRAST Result Date: 11/27/2023 CLINICAL DATA:  Perianal abscess or fistula possible. History of sacral pressure ulcers. EXAM: CT PELVIS WITHOUT CONTRAST TECHNIQUE: Multidetector CT imaging of the pelvis was performed following the standard protocol without intravenous contrast. RADIATION DOSE REDUCTION: This exam was performed according to the departmental dose-optimization program which includes automated exposure control, adjustment of the mA and/or kV according to  patient size and/or use of iterative reconstruction technique. COMPARISON:  09/13/2022 FINDINGS: Urinary Tract:  No abnormality visualized. Bowel:  Visualized small bowel, colon and appendix are normal. Vascular/Lymphatic: Calcified plaque over the abdominal aorta which is normal in caliber. Remaining vascular structures are unremarkable. No adenopathy. Reproductive: Calcified uterine fibroids present. Adnexal regions are unremarkable. Other: No free fluid or focal inflammatory change in the abdomen/pelvis. No evidence of perianal/perirectal abscess. Musculoskeletal: Mild increased density in the subcutaneous fat over the midline coccygeal region slightly worse. No air in the soft tissues. No definite subcutaneous abscess. No definite underlying bone destruction to suggest osteomyelitis. Subtle stranding of the subcutaneous fat over the left inferior gluteal fold unchanged. Minimal degenerate changes of the spine and hips. IMPRESSION: 1. Mild increased density in the subcutaneous fat over the midline coccygeal region  slightly worse. No air in the soft tissues. No definite subcutaneous abscess. No definite underlying bone destruction to suggest osteomyelitis. 2. No evidence of perianal/perirectal abscess. 3. Subtle stranding of the subcutaneous fat over the left inferior gluteal fold unchanged. 4. Calcified uterine fibroids. 5. Aortic atherosclerosis. Aortic Atherosclerosis (ICD10-I70.0). Electronically Signed   By: Elberta Fortis M.D.   On: 11/27/2023 08:50    Microbiology: Results for orders placed or performed during the hospital encounter of 12/15/23  Blood culture (routine x 2)     Status: None (Preliminary result)   Collection Time: 12/15/23  6:36 PM   Specimen: BLOOD  Result Value Ref Range Status   Specimen Description BLOOD LEFT ANTECUBITAL  Final   Special Requests   Final    BOTTLES DRAWN AEROBIC AND ANAEROBIC Blood Culture results may not be optimal due to an inadequate volume of blood received  in culture bottles   Culture   Final    NO GROWTH 3 DAYS Performed at [facility], [address] [place], [place] [address]    Report Status PENDING  Incomplete  Blood culture (routine x 2)     Status: None (Preliminary result)   Collection Time: 12/16/23  4:27 AM   Specimen: BLOOD  Result Value Ref Range Status   Specimen Description BLOOD RIGHT ANTECUBITAL  Final   Special Requests   Final    BOTTLES DRAWN AEROBIC AND ANAEROBIC Blood Culture results may not be optimal due to an inadequate volume of blood received in culture bottles   Culture   Final    NO GROWTH 2 DAYS Performed at [facility], [address], [place], [place] [address]    Report Status PENDING  Incomplete  Aerobic/Anaerobic Culture w Gram Stain (surgical/deep wound)     Status: None (Preliminary result)   Collection Time: 12/16/23  9:43 AM   Specimen: PATH Skin excision; Tissue  Result Value Ref Range Status   Specimen Description WOUND  Final   Special Requests LEFT DU  Final   Gram Stain   Final    RARE WBC PRESENT, PREDOMINANTLY PMN NO ORGANISMS SEEN    Culture   Final    RARE KLEBSIELLA AEROGENES CULTURE REINCUBATED FOR BETTER GROWTH Performed at [facility], [address], [place], [place] [address]    Report Status PENDING  Incomplete    Labs: CBC: Recent Labs  Lab 12/15/23 1844 12/16/23 0427 12/17/23 0849 12/18/23 0832  WBC 11.4* 12.8* 15.2* 11.1*  NEUTROABS 9.2*  --   --  8.5*  HGB 13.6 12.8 12.3 12.3  HCT 43.0 38.9 38.4 37.5  MCV 88.1 85.5 84.6 84.5  PLT 236 254 282 286   Basic Metabolic Panel: Recent Labs  Lab 12/15/23 1844 12/16/23 0427 12/17/23 0849  NA 140 139 142  K 4.0 4.1 3.9  CL 106 107 112*  CO2 22 18* 22  GLUCOSE 127* 138* 100*  BUN 9 6* 7*  CREATININE 0.71 0.61 0.62  CALCIUM 9.2 8.8* 8.3*   Liver Function Tests: No results for input(s): "AST", "ALT", "ALKPHOS", "BILITOT", "PROT", "ALBUMIN" in the last 168 hours. CBG: No results for  input(s): "GLUCAP" in the last 168 hours.  Discharge time spent: greater than 30 minutes.  Signed: Debarah Crape, DO [facility] 12/18/2023

## 2023-12-19 LAB — AEROBIC/ANAEROBIC CULTURE W GRAM STAIN (SURGICAL/DEEP WOUND)

## 2023-12-20 DIAGNOSIS — L89322 Pressure ulcer of left buttock, stage 2: Secondary | ICD-10-CM | POA: Diagnosis not present

## 2023-12-20 DIAGNOSIS — G301 Alzheimer's disease with late onset: Secondary | ICD-10-CM | POA: Diagnosis not present

## 2023-12-20 DIAGNOSIS — I700 Atherosclerosis of aorta: Secondary | ICD-10-CM | POA: Diagnosis not present

## 2023-12-20 LAB — CULTURE, BLOOD (ROUTINE X 2): Culture: NO GROWTH

## 2023-12-21 LAB — CULTURE, BLOOD (ROUTINE X 2): Culture: NO GROWTH

## 2023-12-24 DIAGNOSIS — G301 Alzheimer's disease with late onset: Secondary | ICD-10-CM | POA: Diagnosis not present

## 2023-12-24 DIAGNOSIS — L89322 Pressure ulcer of left buttock, stage 2: Secondary | ICD-10-CM | POA: Diagnosis not present

## 2023-12-24 DIAGNOSIS — I700 Atherosclerosis of aorta: Secondary | ICD-10-CM | POA: Diagnosis not present

## 2023-12-27 DIAGNOSIS — L89322 Pressure ulcer of left buttock, stage 2: Secondary | ICD-10-CM | POA: Diagnosis not present

## 2023-12-27 DIAGNOSIS — I700 Atherosclerosis of aorta: Secondary | ICD-10-CM | POA: Diagnosis not present

## 2023-12-27 DIAGNOSIS — G301 Alzheimer's disease with late onset: Secondary | ICD-10-CM | POA: Diagnosis not present

## 2023-12-28 ENCOUNTER — Emergency Department (HOSPITAL_COMMUNITY): Payer: Medicare Other

## 2023-12-28 ENCOUNTER — Inpatient Hospital Stay (HOSPITAL_COMMUNITY)
Admission: EM | Admit: 2023-12-28 | Discharge: 2024-01-03 | DRG: 175 | Disposition: A | Discharge status: Home / Self Care | Payer: Medicare Other | Attending: Internal Medicine | Admitting: Internal Medicine

## 2023-12-28 ENCOUNTER — Encounter (HOSPITAL_COMMUNITY): Payer: Self-pay

## 2023-12-28 ENCOUNTER — Other Ambulatory Visit: Payer: Self-pay

## 2023-12-28 DIAGNOSIS — I252 Old myocardial infarction: Secondary | ICD-10-CM

## 2023-12-28 DIAGNOSIS — Z833 Family history of diabetes mellitus: Secondary | ICD-10-CM

## 2023-12-28 DIAGNOSIS — K921 Melena: Secondary | ICD-10-CM | POA: Diagnosis present

## 2023-12-28 DIAGNOSIS — I82220 Acute embolism and thrombosis of inferior vena cava: Principal | ICD-10-CM

## 2023-12-28 DIAGNOSIS — E7800 Pure hypercholesterolemia, unspecified: Secondary | ICD-10-CM | POA: Diagnosis present

## 2023-12-28 DIAGNOSIS — M7989 Other specified soft tissue disorders: Secondary | ICD-10-CM | POA: Diagnosis present

## 2023-12-28 DIAGNOSIS — F03918 Unspecified dementia, unspecified severity, with other behavioral disturbance: Secondary | ICD-10-CM | POA: Diagnosis not present

## 2023-12-28 DIAGNOSIS — E8809 Other disorders of plasma-protein metabolism, not elsewhere classified: Secondary | ICD-10-CM | POA: Diagnosis present

## 2023-12-28 DIAGNOSIS — L89154 Pressure ulcer of sacral region, stage 4: Secondary | ICD-10-CM | POA: Diagnosis not present

## 2023-12-28 DIAGNOSIS — Z79899 Other long term (current) drug therapy: Secondary | ICD-10-CM

## 2023-12-28 DIAGNOSIS — Z7189 Other specified counseling: Secondary | ICD-10-CM | POA: Diagnosis not present

## 2023-12-28 DIAGNOSIS — I2699 Other pulmonary embolism without acute cor pulmonale: Secondary | ICD-10-CM | POA: Diagnosis present

## 2023-12-28 DIAGNOSIS — Z66 Do not resuscitate: Secondary | ICD-10-CM | POA: Diagnosis not present

## 2023-12-28 DIAGNOSIS — E870 Hyperosmolality and hypernatremia: Secondary | ICD-10-CM | POA: Diagnosis present

## 2023-12-28 DIAGNOSIS — Z800 Family history of malignant neoplasm of digestive organs: Secondary | ICD-10-CM | POA: Diagnosis not present

## 2023-12-28 DIAGNOSIS — L98429 Non-pressure chronic ulcer of back with unspecified severity: Secondary | ICD-10-CM | POA: Diagnosis not present

## 2023-12-28 DIAGNOSIS — L089 Local infection of the skin and subcutaneous tissue, unspecified: Secondary | ICD-10-CM | POA: Diagnosis not present

## 2023-12-28 DIAGNOSIS — Z515 Encounter for palliative care: Secondary | ICD-10-CM

## 2023-12-28 DIAGNOSIS — M869 Osteomyelitis, unspecified: Secondary | ICD-10-CM | POA: Diagnosis present

## 2023-12-28 DIAGNOSIS — L98499 Non-pressure chronic ulcer of skin of other sites with unspecified severity: Secondary | ICD-10-CM | POA: Diagnosis not present

## 2023-12-28 DIAGNOSIS — F0390 Unspecified dementia without behavioral disturbance: Secondary | ICD-10-CM | POA: Diagnosis present

## 2023-12-28 DIAGNOSIS — D500 Iron deficiency anemia secondary to blood loss (chronic): Secondary | ICD-10-CM | POA: Diagnosis not present

## 2023-12-28 DIAGNOSIS — Z789 Other specified health status: Secondary | ICD-10-CM | POA: Diagnosis not present

## 2023-12-28 DIAGNOSIS — I160 Hypertensive urgency: Secondary | ICD-10-CM | POA: Diagnosis not present

## 2023-12-28 DIAGNOSIS — Z820 Family history of epilepsy and other diseases of the nervous system: Secondary | ICD-10-CM

## 2023-12-28 DIAGNOSIS — L89324 Pressure ulcer of left buttock, stage 4: Secondary | ICD-10-CM | POA: Diagnosis present

## 2023-12-28 DIAGNOSIS — I10 Essential (primary) hypertension: Secondary | ICD-10-CM | POA: Diagnosis not present

## 2023-12-28 DIAGNOSIS — Z7401 Bed confinement status: Secondary | ICD-10-CM | POA: Diagnosis not present

## 2023-12-28 DIAGNOSIS — M810 Age-related osteoporosis without current pathological fracture: Secondary | ICD-10-CM | POA: Diagnosis present

## 2023-12-28 DIAGNOSIS — I2694 Multiple subsegmental pulmonary emboli without acute cor pulmonale: Principal | ICD-10-CM | POA: Diagnosis present

## 2023-12-28 DIAGNOSIS — I82421 Acute embolism and thrombosis of right iliac vein: Secondary | ICD-10-CM | POA: Diagnosis present

## 2023-12-28 DIAGNOSIS — R6889 Other general symptoms and signs: Secondary | ICD-10-CM | POA: Diagnosis not present

## 2023-12-28 DIAGNOSIS — Z8249 Family history of ischemic heart disease and other diseases of the circulatory system: Secondary | ICD-10-CM

## 2023-12-28 DIAGNOSIS — Z743 Need for continuous supervision: Secondary | ICD-10-CM | POA: Diagnosis not present

## 2023-12-28 DIAGNOSIS — L89309 Pressure ulcer of unspecified buttock, unspecified stage: Secondary | ICD-10-CM | POA: Diagnosis present

## 2023-12-28 DIAGNOSIS — O2230 Deep phlebothrombosis in pregnancy, unspecified trimester: Secondary | ICD-10-CM | POA: Diagnosis present

## 2023-12-28 DIAGNOSIS — R531 Weakness: Secondary | ICD-10-CM | POA: Diagnosis not present

## 2023-12-28 DIAGNOSIS — R9431 Abnormal electrocardiogram [ECG] [EKG]: Secondary | ICD-10-CM | POA: Diagnosis not present

## 2023-12-28 DIAGNOSIS — D62 Acute posthemorrhagic anemia: Secondary | ICD-10-CM | POA: Diagnosis not present

## 2023-12-28 DIAGNOSIS — L89300 Pressure ulcer of unspecified buttock, unstageable: Secondary | ICD-10-CM

## 2023-12-28 DIAGNOSIS — I82491 Acute embolism and thrombosis of other specified deep vein of right lower extremity: Secondary | ICD-10-CM | POA: Diagnosis not present

## 2023-12-28 DIAGNOSIS — Z886 Allergy status to analgesic agent status: Secondary | ICD-10-CM | POA: Diagnosis not present

## 2023-12-28 DIAGNOSIS — I82411 Acute embolism and thrombosis of right femoral vein: Secondary | ICD-10-CM | POA: Diagnosis present

## 2023-12-28 DIAGNOSIS — R609 Edema, unspecified: Secondary | ICD-10-CM | POA: Diagnosis not present

## 2023-12-28 LAB — CBC WITH DIFFERENTIAL/PLATELET
Abs Immature Granulocytes: 0.2 10*3/uL — ABNORMAL HIGH (ref 0.00–0.07)
Basophils Absolute: 0.1 10*3/uL (ref 0.0–0.1)
Basophils Relative: 0 %
Eosinophils Absolute: 0 10*3/uL (ref 0.0–0.5)
Eosinophils Relative: 0 %
HCT: 39.5 % (ref 36.0–46.0)
Hemoglobin: 12.6 g/dL (ref 12.0–15.0)
Immature Granulocytes: 1 %
Lymphocytes Relative: 8 %
Lymphs Abs: 1.3 10*3/uL (ref 0.7–4.0)
MCH: 27.8 pg (ref 26.0–34.0)
MCHC: 31.9 g/dL (ref 30.0–36.0)
MCV: 87.2 fL (ref 80.0–100.0)
Monocytes Absolute: 1.1 10*3/uL — ABNORMAL HIGH (ref 0.1–1.0)
Monocytes Relative: 7 %
Neutro Abs: 13.6 10*3/uL — ABNORMAL HIGH (ref 1.7–7.7)
Neutrophils Relative %: 84 %
Platelets: 209 10*3/uL (ref 150–400)
RBC: 4.53 MIL/uL (ref 3.87–5.11)
RDW: 17 % — ABNORMAL HIGH (ref 11.5–15.5)
WBC: 16.4 10*3/uL — ABNORMAL HIGH (ref 4.0–10.5)
nRBC: 0.4 % — ABNORMAL HIGH (ref 0.0–0.2)

## 2023-12-28 LAB — BASIC METABOLIC PANEL
Anion gap: 15 (ref 5–15)
BUN: 15 mg/dL (ref 8–23)
CO2: 23 mmol/L (ref 22–32)
Calcium: 9.2 mg/dL (ref 8.9–10.3)
Chloride: 105 mmol/L (ref 98–111)
Creatinine, Ser: 0.92 mg/dL (ref 0.44–1.00)
GFR, Estimated: >60 mL/min (ref >60)
Glucose, Bld: 107 mg/dL — ABNORMAL HIGH (ref 70–99)
Potassium: 3.6 mmol/L (ref 3.5–5.1)
Sodium: 143 mmol/L (ref 135–145)

## 2023-12-28 LAB — CK: Total CK: 204 U/L (ref 38–234)

## 2023-12-28 LAB — HEPATIC FUNCTION PANEL
ALT: 60 U/L — ABNORMAL HIGH (ref 0–44)
AST: 45 U/L — ABNORMAL HIGH (ref 15–41)
Albumin: 2.8 g/dL — ABNORMAL LOW (ref 3.5–5.0)
Alkaline Phosphatase: 62 U/L (ref 38–126)
Bilirubin, Direct: 0.2 mg/dL (ref 0.0–0.2)
Indirect Bilirubin: 0.8 mg/dL (ref 0.3–0.9)
Total Bilirubin: 1 mg/dL (ref 0.0–1.2)
Total Protein: 6.6 g/dL (ref 6.5–8.1)

## 2023-12-28 LAB — I-STAT CG4 LACTIC ACID, ED
Lactic Acid, Venous: 1.9 mmol/L (ref 0.5–1.9)
Lactic Acid, Venous: 0.9 mmol/L (ref 0.5–1.9)

## 2023-12-28 LAB — C-REACTIVE PROTEIN: CRP: 7.5 mg/dL — ABNORMAL HIGH (ref <1.0)

## 2023-12-28 LAB — SEDIMENTATION RATE: Sed Rate: 19 mm/h (ref 0–22)

## 2023-12-28 LAB — TROPONIN I (HIGH SENSITIVITY): Troponin I (High Sensitivity): 15 ng/L (ref <18)

## 2023-12-28 LAB — TSH: TSH: 1.599 u[IU]/mL (ref 0.350–4.500)

## 2023-12-28 LAB — PROCALCITONIN: Procalcitonin: <0.1 ng/mL

## 2023-12-28 LAB — MAGNESIUM: Magnesium: 2.5 mg/dL — ABNORMAL HIGH (ref 1.7–2.4)

## 2023-12-28 LAB — PHOSPHORUS: Phosphorus: 3.5 mg/dL (ref 2.5–4.6)

## 2023-12-28 MED ORDER — SODIUM CHLORIDE 0.9 % IV SOLN
2.0000 g | Freq: Two times a day (BID) | INTRAVENOUS | Status: DC
  Administered 2023-12-28 – 2023-12-29 (×2): 2 g via INTRAVENOUS
  Filled 2023-12-28 (×2): qty 12.5

## 2023-12-28 MED ORDER — MORPHINE SULFATE (PF) 4 MG/ML IV SOLN
4.0000 mg | Freq: Once | INTRAVENOUS | Status: AC
Start: 2023-12-28 — End: 2023-12-28
  Administered 2023-12-28: 4 mg via INTRAVENOUS
  Filled 2023-12-28: qty 1

## 2023-12-28 MED ORDER — VANCOMYCIN HCL 1250 MG/250ML IV SOLN
1250.0000 mg | Freq: Once | INTRAVENOUS | Status: AC
  Administered 2023-12-28: 1250 mg via INTRAVENOUS
  Filled 2023-12-28: qty 250

## 2023-12-28 MED ORDER — METRONIDAZOLE 500 MG/100ML IV SOLN
500.0000 mg | Freq: Two times a day (BID) | INTRAVENOUS | Status: DC
  Administered 2023-12-28 – 2023-12-29 (×2): 500 mg via INTRAVENOUS
  Filled 2023-12-28 (×2): qty 100

## 2023-12-28 MED ORDER — HEPARIN BOLUS VIA INFUSION
4500.0000 [IU] | Freq: Once | INTRAVENOUS | Status: AC
  Administered 2023-12-28: 4500 [IU] via INTRAVENOUS
  Filled 2023-12-28: qty 4500

## 2023-12-28 MED ORDER — HEPARIN (PORCINE) 25000 UT/250ML-% IV SOLN
1150.0000 [IU]/h | INTRAVENOUS | Status: DC
  Administered 2023-12-28: 1150 [IU]/h via INTRAVENOUS
  Filled 2023-12-28: qty 250

## 2023-12-28 MED ORDER — VANCOMYCIN HCL 750 MG/150ML IV SOLN
750.0000 mg | INTRAVENOUS | Status: DC
  Filled 2023-12-28: qty 150

## 2023-12-28 MED ORDER — IOHEXOL 350 MG/ML SOLN
75.0000 mL | Freq: Once | INTRAVENOUS | Status: AC | PRN
  Administered 2023-12-28: 75 mL via INTRAVENOUS

## 2023-12-28 NOTE — Assessment & Plan Note (Signed)
Continue Lipitor 40 mg a day

## 2023-12-28 NOTE — ED Notes (Signed)
 Pt transported to vascular.

## 2023-12-28 NOTE — ED Provider Notes (Signed)
Care assumed from previous provider.  See note for full HPI.  In summation 83 year old history of dementia, recent admission for sacral wound requiring surgical intervention here for evaluation of right lower extremity swelling.  Patient comes in with son who helps provide history.  Has had some drainage from wound.  No fever.  Acting at her baseline.  Patient neurovascularly intact.  Will plan to follow-up on labs and imaging.  FULL CODE  Physical Exam  BP 133/70   Pulse 76   Temp 98.2 F (36.8 C) (Axillary)   Resp 16   Ht 5\' 2"  (1.575 m)   Wt 66.7 kg   LMP  (LMP Unknown)   SpO2 99%   BMI 26.90 kg/m   Physical Exam Vitals and nursing note reviewed.  Constitutional:      General: She is not in acute distress.    Appearance: She is well-developed. She is ill-appearing. She is not toxic-appearing or diaphoretic.     Comments: Pleasantly confused  HENT:     Head: Atraumatic.  Eyes:     Pupils: Pupils are equal, round, and reactive to light.  Cardiovascular:     Rate and Rhythm: Normal rate.     Pulses:          Radial pulses are 2+ on the right side and 2+ on the left side.       Dorsalis pedis pulses are 2+ on the right side and 2+ on the left side.  Pulmonary:     Effort: Pulmonary effort is normal. No respiratory distress.     Breath sounds: Normal breath sounds.  Abdominal:     General: Bowel sounds are normal. There is no distension.     Palpations: Abdomen is soft.  Genitourinary:    Comments: Sacral wound, scant discharge, no surrounding erythema Musculoskeletal:        General: Normal range of motion.     Cervical back: Normal range of motion.     Right lower leg: Edema present.     Comments: Right lower extremity swelling, compartments soft.  2+ DP, PT pulses  Skin:    General: Skin is warm and dry.     Capillary Refill: Capillary refill takes less than 2 seconds.  Neurological:     Mental Status: She is alert. Mental status is at baseline.     Sensory:  Sensation is intact.     Motor: Motor function is intact.     Comments: Moves extremities At baseline mentation per son in room  Psychiatric:        Mood and Affect: Mood normal.    Procedures  .Critical Care  Performed by: Linwood Dibbles, PA-C Authorized by: Linwood Dibbles, PA-C   Critical care provider statement:    Critical care time (minutes):  36   Critical care was necessary to treat or prevent imminent or life-threatening deterioration of the following conditions:  Circulatory failure   Critical care was time spent personally by me on the following activities:  Development of treatment plan with patient or surrogate, discussions with consultants, evaluation of patient's response to treatment, examination of patient, ordering and review of laboratory studies, ordering and review of radiographic studies, ordering and performing treatments and interventions, pulse oximetry, re-evaluation of patient's condition and review of old charts  Labs Reviewed  CBC WITH DIFFERENTIAL/PLATELET - Abnormal; Notable for the following components:      Result Value   WBC 16.4 (*)    RDW 17.0 (*)  nRBC 0.4 (*)    Neutro Abs 13.6 (*)    Monocytes Absolute 1.1 (*)    Abs Immature Granulocytes 0.20 (*)    All other components within normal limits  BASIC METABOLIC PANEL - Abnormal; Notable for the following components:   Glucose, Bld 107 (*)    All other components within normal limits  C-REACTIVE PROTEIN - Abnormal; Notable for the following components:   CRP 7.5 (*)    All other components within normal limits  MAGNESIUM - Abnormal; Notable for the following components:   Magnesium 2.5 (*)    All other components within normal limits  HEPATIC FUNCTION PANEL - Abnormal; Notable for the following components:   Albumin 2.8 (*)    AST 45 (*)    ALT 60 (*)    All other components within normal limits  CULTURE, BLOOD (ROUTINE X 2)  CULTURE, BLOOD (ROUTINE X 2)  SEDIMENTATION RATE   PROCALCITONIN  CK  PHOSPHORUS  TSH  HEPARIN LEVEL (UNFRACTIONATED)  PREALBUMIN  URINALYSIS, COMPLETE (UACMP) WITH MICROSCOPIC  I-STAT CG4 LACTIC ACID, ED  I-STAT CG4 LACTIC ACID, ED  I-STAT CG4 LACTIC ACID, ED  TROPONIN I (HIGH SENSITIVITY)  TROPONIN I (HIGH SENSITIVITY)   CT Angio Chest PE W and/or Wo Contrast Result Date: 12/28/2023 CLINICAL DATA:  Two weeks postoperative from right sacral ulcer repair, right lower extremity swelling and edema. Some purulence from the region of the decubitus ulcer. Dementia. EXAM: CT ANGIOGRAPHY CHEST CT ABDOMEN AND PELVIS WITH CONTRAST TECHNIQUE: Multidetector CT imaging of the chest was performed using the standard protocol during bolus administration of intravenous contrast. Multiplanar CT image reconstructions and MIPs were obtained to evaluate the vascular anatomy. Multidetector CT imaging of the abdomen and pelvis was performed using the standard protocol during bolus administration of intravenous contrast. RADIATION DOSE REDUCTION: This exam was performed according to the departmental dose-optimization program which includes automated exposure control, adjustment of the mA and/or kV according to patient size and/or use of iterative reconstruction technique. CONTRAST:  75mL OMNIPAQUE IOHEXOL 350 MG/ML SOLN COMPARISON:  Multiple exams, including 09/13/2022 and CT pelvis from 11/27/2023 FINDINGS: CTA CHEST FINDINGS Cardiovascular: Small pulmonary embolus in the anterior segmental branch of the right upper lobe pulmonary artery, image 131 series 7. Segmental pulmonary embolus in the lingular segmental branch of the left pulmonary artery. No lobar or greater level clot, accordingly RV to left LV ratio measurement is not indicated. Coronary, aortic arch, and branch vessel atherosclerotic vascular disease. Mediastinum/Nodes: Chronic dense calcification adjacent to the distal esophagus and just above the diaphragm, not changed from 2023, probably a densely calcified  lymph node. Dilated upper esophagus noted. Lungs/Pleura: Unremarkable Musculoskeletal: No acute musculoskeletal findings. Review of the MIP images confirms the above findings. CT ABDOMEN and PELVIS FINDINGS Hepatobiliary: Stable hypodense hepatic lesions, likely cysts. Gallbladder mildly obscured by motion artifact but grossly unremarkable. Pancreas: Stable mild prominence of dorsal pancreatic duct in the pancreatic head. Otherwise unremarkable. Spleen: Benign calcified granulomas in the spleen. Adrenals/Urinary Tract: No significant findings. Stomach/Bowel: Mild prominence of stool in the proximal colon. No dilated bowel observed. Normal appendix. Vascular/Lymphatic: Atherosclerosis is present, including aortoiliac atherosclerotic disease. There is extensive thrombus in the right proximal superficial femoral vein, profundal branch, right common femoral vein, right external iliac vein, right common iliac vein, and extending up the IVC to the level of the renal veins. Reproductive: Calcified uterine fibroids. Other: Presacral edema. Musculoskeletal: Abnormal cutaneous thickening, subcutaneous stranding, and trace amount of left paracentral gas along the gluteal  cleft with density in this vicinity extending to the outer periosteal margin of the sacrum. No clear bony destructive findings to further indicate osteomyelitis, although there is some mild presacral edema and MRI would be more specific with regard to osteomyelitis. The miniscule amount of gas to the left of the sulcus on image 65 series 3 may be spontaneously draining as a cause for the reported purulent discharge. No definite non draining abscess observed. Substantial subcutaneous edema in the right upper thigh, most likely related to the DVT. Review of the MIP images confirms the above findings. IMPRESSION: 1. Extensive deep vein thrombosis in the right proximal superficial femoral vein, profundal branch, right common femoral vein, right external iliac  vein, right common iliac vein, and extending up the IVC to the level of the renal veins. Given the unusual heavy clot burden, catheter thrombectomy should be considered especially if the right lower extremity is symptomatic. 2. Small acute pulmonary emboli in the anterior segmental branch of the right upper lobe pulmonary artery and in the lingular segmental branch of the left pulmonary artery. No lobar or greater level clot, accordingly RV to left LV ratio measurement is not indicated. 3. Abnormal cutaneous thickening, subcutaneous stranding, and trace amount of left paracentral gas along the gluteal cleft with density in this vicinity extending to the outer periosteal margin of the sacrum. No clear bony destructive findings to further indicate osteomyelitis, although there is some mild presacral edema and MRI would be more specific with regard to osteomyelitis. The miniscule amount of gas to the left of the sulcus may be spontaneously draining as a cause for the reported purulent discharge. No definite non draining abscess observed. 4. Substantial subcutaneous edema in the right upper thigh, most likely related to the DVT. 5. Mild prominence of stool in the proximal colon. 6. Calcified uterine fibroids. 7. Aortic atherosclerosis. Electronically Signed   By: Gaylyn Rong M.D.   On: 12/28/2023 18:11   CT ABDOMEN PELVIS W CONTRAST Result Date: 12/28/2023 CLINICAL DATA:  Two weeks postoperative from right sacral ulcer repair, right lower extremity swelling and edema. Some purulence from the region of the decubitus ulcer. Dementia. EXAM: CT ANGIOGRAPHY CHEST CT ABDOMEN AND PELVIS WITH CONTRAST TECHNIQUE: Multidetector CT imaging of the chest was performed using the standard protocol during bolus administration of intravenous contrast. Multiplanar CT image reconstructions and MIPs were obtained to evaluate the vascular anatomy. Multidetector CT imaging of the abdomen and pelvis was performed using the standard  protocol during bolus administration of intravenous contrast. RADIATION DOSE REDUCTION: This exam was performed according to the departmental dose-optimization program which includes automated exposure control, adjustment of the mA and/or kV according to patient size and/or use of iterative reconstruction technique. CONTRAST:  75mL OMNIPAQUE IOHEXOL 350 MG/ML SOLN COMPARISON:  Multiple exams, including 09/13/2022 and CT pelvis from 11/27/2023 FINDINGS: CTA CHEST FINDINGS Cardiovascular: Small pulmonary embolus in the anterior segmental branch of the right upper lobe pulmonary artery, image 131 series 7. Segmental pulmonary embolus in the lingular segmental branch of the left pulmonary artery. No lobar or greater level clot, accordingly RV to left LV ratio measurement is not indicated. Coronary, aortic arch, and branch vessel atherosclerotic vascular disease. Mediastinum/Nodes: Chronic dense calcification adjacent to the distal esophagus and just above the diaphragm, not changed from 2023, probably a densely calcified lymph node. Dilated upper esophagus noted. Lungs/Pleura: Unremarkable Musculoskeletal: No acute musculoskeletal findings. Review of the MIP images confirms the above findings. CT ABDOMEN and PELVIS FINDINGS Hepatobiliary: Stable hypodense hepatic  lesions, likely cysts. Gallbladder mildly obscured by motion artifact but grossly unremarkable. Pancreas: Stable mild prominence of dorsal pancreatic duct in the pancreatic head. Otherwise unremarkable. Spleen: Benign calcified granulomas in the spleen. Adrenals/Urinary Tract: No significant findings. Stomach/Bowel: Mild prominence of stool in the proximal colon. No dilated bowel observed. Normal appendix. Vascular/Lymphatic: Atherosclerosis is present, including aortoiliac atherosclerotic disease. There is extensive thrombus in the right proximal superficial femoral vein, profundal branch, right common femoral vein, right external iliac vein, right common  iliac vein, and extending up the IVC to the level of the renal veins. Reproductive: Calcified uterine fibroids. Other: Presacral edema. Musculoskeletal: Abnormal cutaneous thickening, subcutaneous stranding, and trace amount of left paracentral gas along the gluteal cleft with density in this vicinity extending to the outer periosteal margin of the sacrum. No clear bony destructive findings to further indicate osteomyelitis, although there is some mild presacral edema and MRI would be more specific with regard to osteomyelitis. The miniscule amount of gas to the left of the sulcus on image 65 series 3 may be spontaneously draining as a cause for the reported purulent discharge. No definite non draining abscess observed. Substantial subcutaneous edema in the right upper thigh, most likely related to the DVT. Review of the MIP images confirms the above findings. IMPRESSION: 1. 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 Given the unusual heavy clot burden, catheter thrombectomy should be considered especially if the right lower extremity is symptomatic. 2. Small acute pulmonary emboli in the anterior segmental branch of the right upper lobe pulmonary artery and in the lingular segmental branch of the left pulmonary artery. No lobar or greater level clot, accordingly RV to left LV ratio measurement is not indicated. 3. Abnormal cutaneous thickening, subcutaneous stranding, and trace amount of left paracentral gas along the gluteal cleft with density in this vicinity extending to the outer periosteal margin of the sacrum. No clear bony destructive findings to further indicate osteomyelitis, although there is some mild presacral edema and MRI would be more specific with regard to osteomyelitis. The miniscule amount of gas to the left  of the sulcus may be spontaneously draining as a cause for the reported purulent discharge. No definite non draining abscess observed. 4. Substantial subcutaneous edema in the right upper thigh, most likely related to the DVT. 5. Mild prominence of stool in the proximal colon. 6. Calcified uterine fibroids. 7. Aortic atherosclerosis. Electronically Signed   By: Gaylyn Rong M.D.   On: 12/28/2023 18:11   VAS [place] LOWER EXTREMITY VENOUS (DVT) (7a-7p) Result Date: 12/28/2023  Lower Venous DVT Study Patient Name:  [name] [facility]  Date of Exam:   12/28/2023 Medical Rec #: [id number]       Accession #:    [id number] Date of Birth: Jul 24, 1941       Patient Gender: F Patient Age:   26 years Exam Location:  [facility] Procedure:      VAS [place] LOWER EXTREMITY VENOUS (DVT) Referring Phys: Evlyn Kanner --------------------------------------------------------------------------------  Indications: Swelling, Edema, and Post-op.  Limitations: Combative patient, limited exam. unable to image the contralateral limb. Comparison Study: No prior exam. Performing Technologist: Fernande Bras  Examination Guidelines: A complete evaluation includes B-mode imaging, spectral Doppler, color Doppler, and power Doppler as needed of all accessible portions of each vessel. Bilateral testing is considered an  integral part of a complete examination. Limited examinations for reoccurring indications may be performed as noted. The reflux portion of the exam is performed with the patient in reverse Trendelenburg.  +---------+---------------+---------+-----------+----------+-------------------+ RIGHT    CompressibilityPhasicitySpontaneityPropertiesThrombus Aging      +---------+---------------+---------+-----------+----------+-------------------+ CFV      None           No       No                                       +---------+---------------+---------+-----------+----------+-------------------+ SFJ      None                                                              +---------+---------------+---------+-----------+----------+-------------------+ FV Prox  None           No       No                                       +---------+---------------+---------+-----------+----------+-------------------+ FV Mid   None           No       No                                       +---------+---------------+---------+-----------+----------+-------------------+ FV DistalNone           No       No                                       +---------+---------------+---------+-----------+----------+-------------------+ PFV      None           No       No                                       +---------+---------------+---------+-----------+----------+-------------------+ POP      None           No       No                                       +---------+---------------+---------+-----------+----------+-------------------+ PTV                                                   Not well visualized +---------+---------------+---------+-----------+----------+-------------------+ PERO     Partial        No       No                                       +---------+---------------+---------+-----------+----------+-------------------+  GSV      None           Yes      Yes                                      +---------+---------------+---------+-----------+----------+-------------------+   +----+---------------+---------+-----------+----------+--------------+ LEFTCompressibilityPhasicitySpontaneityPropertiesThrombus Aging +----+---------------+---------+-----------+----------+--------------+ CFV Full                                                        +----+---------------+---------+-----------+----------+--------------+    Summary: RIGHT: - Findings consistent with age indeterminate deep vein thrombosis involving the right common femoral vein, SF junction, right femoral vein,  right proximal profunda vein, right popliteal vein, and right peroneal veins.    *See table(s) above for measurements and observations.    Preliminary    DG Pelvis Portable Result Date: 12/28/2023 CLINICAL DATA:  Right lower extremity swelling. EXAM: PORTABLE PELVIS 1-2 VIEWS COMPARISON:  February 09, 2009. FINDINGS: There is no evidence of pelvic fracture or diastasis. No pelvic bone lesions are seen. IMPRESSION: Negative. Electronically Signed   By: Lupita Raider M.D.   On: 12/28/2023 14:55    ED Course / MDM   Clinical Course as of 12/28/23 2343  Sat Dec 28, 2023  1610 Dr. Randie Heinz with Vascular will see family however unsure if candidate for thrombectomy given hx [BH]  1854 Dr. Adela Glimpse with medicine to see for admission [BH]    Clinical Course User Index [BH] Bailee Metter A, PA-C   Care assumed from previous provider.  See note for full HPI.  In summation 83 year old history of dementia, recent admission for sacral wound requiring surgical intervention here for evaluation of right lower extremity swelling.  Patient comes in with son who helps provide history.  Has had some drainage from wound.  No fever.  Acting at her baseline.  Patient neurovascularly intact.  Will plan to follow-up on labs and imaging.  FULL CODE  Labs and imaging personally viewed interpreted  Patient with extensive DVT, up through her IVC into the renal pelvis also with pulmonary embolism.  Also notes some presacral changes could possibly get MRI to rule out osteo at her sacral wounds.  Discussed with radiology.  He feels patient may be a thrombectomy candidate.  Will discuss with vascular  Discussed with Dr. Randie Heinz with vascular surgery.  He will see patient at bedside however given her immobilization would not emergently take her to the OR at this time given her leg is well-perfused.  Discussed with Dr. Adela Glimpse with medicine who agrees to eval patient for admission.  Patient started on heparin.  The patient  appears reasonably stabilized for admission considering the current resources, flow, and capabilities available in the ED at this time, and I doubt any other [facility] requiring further screening and/or treatment in the ED prior to admission.   Medical Decision Making Amount and/or Complexity of Data Reviewed Independent Historian:     Details: Son in room External Data Reviewed: labs, radiology, ECG and notes. Labs: ordered. Decision-making details documented in ED Course. Radiology: ordered and independent interpretation performed. Decision-making details documented in ED Course. ECG/medicine tests: ordered and independent interpretation performed. Decision-making details documented in ED Course.  Risk OTC drugs. Prescription drug management. Parenteral controlled substances.  Decision regarding hospitalization. Diagnosis or treatment significantly limited by social determinants of health.          Linwood Dibbles, PA-C 12/28/23 2343    Glendora Score, MD 12/29/23 [phone]

## 2023-12-28 NOTE — ED Provider Notes (Cosign Needed Addendum)
[facility] AT [facility] Provider Note   CSN: [id number] Arrival date & time: 12/28/23  1344     History  Chief Complaint  Patient presents with   Leg Swelling    [name] is a 83 y.o. female history of decubitus ulcer buttocks, hypertension, dementia presented for right leg swelling along with worsening of her decubitus ulcer.  Patient is unable to provide history as she has dementia at baseline so son provided history.  Son states that patient was recently here and admitted for debridement and discharged antibiotics however has not done well since then.  Son endorses that there have been more purulent material coming out of the wound but denies any fevers.  Son also states that patient is bedridden and her right leg appears more swollen than her left and he is concerned for blood clot.  Patient does not have history of blood clots and is not on any blood thinners.  Home Medications Prior to Admission medications   Medication Sig Start Date End Date Taking? Authorizing Provider  acetaminophen (TYLENOL) 500 MG tablet Take 1 tablet (500 mg total) by mouth every 6 (six) hours as needed for fever. 12/18/23   Dezii, Alexandra, DO  atorvastatin (LIPITOR) 40 MG tablet Take 40 mg by mouth daily.    [provider]  calcium-vitamin D (OSCAL) 250-125 MG-UNIT per tablet Take 1 tablet by mouth daily.    [provider]  ibuprofen (ADVIL,MOTRIN) 200 MG tablet Take 200 mg by mouth every 6 (six) hours as needed for moderate pain.     [provider]  memantine (NAMENDA) 10 MG tablet Take 1 tablet (10 mg total) by mouth 2 (two) times daily. 11/06/23     mirtazapine (REMERON) 7.5 MG tablet Take 1 tablet (7.5 mg total) by mouth at bedtime as needed for sleep 11/06/23     Multiple Vitamin (MULTIVITAMIN) tablet Take 1 tablet by mouth daily.    [provider]  QUEtiapine (SEROQUEL) 25 MG tablet Take 1 tablet (25 mg total) by mouth at  bedtime as needed for sleep/confusion. Patient taking differently: Take 25 mg by mouth at bedtime. 11/06/23     solifenacin (VESICARE) 5 MG tablet Take 1 tablet (5 mg total) by mouth every evening. 12/03/23     triamcinolone cream (KENALOG) 0.1 % Apply 1 Application topically daily as needed (for itching). 10/01/23   [provider]      Allergies    Aspirin    Review of Systems   Review of Systems  Physical Exam Updated Vital Signs BP (!) 143/83 (BP Location: Right Arm)   Pulse 87   Temp 98.2 F (36.8 C) (Axillary)   Resp 18   LMP  (LMP Unknown)   SpO2 100%  Physical Exam Exam conducted with a chaperone present Neva Seat, Luisa Hart, NT).  Constitutional:      General: She is not in acute distress. Eyes:     Pupils: Pupils are equal, round, and reactive to light.  Cardiovascular:     Rate and Rhythm: Normal rate and regular rhythm.     Pulses: Normal pulses.     Heart sounds: Normal heart sounds.  Pulmonary:     Effort: Pulmonary effort is normal. No respiratory distress.     Breath sounds: Normal breath sounds.  Abdominal:     Palpations: Abdomen is soft.     Tenderness: There is no abdominal tenderness. There is no guarding or rebound.  Musculoskeletal:  Comments: Entire right leg appears swollen compared to left Calf tenderness on the right side Able to range bilateral lower extremities Soft compartments Pain not out of proportion  Skin:    General: Skin is warm and dry.     Capillary Refill: Capillary refill takes less than 2 seconds.     Comments: Stage IV decubitus ulcer with surrounding erythema and mild warmth with purulent material noted  Neurological:     Mental Status: She is alert. Mental status is at baseline.     ED Results / Procedures / Treatments   Labs (all labs ordered are listed, but only abnormal results are displayed) Labs Reviewed - No data to display  EKG None  Radiology No results found.  Procedures Procedures     Medications Ordered in ED Medications - No data to display  ED Course/ Medical Decision Making/ A&P                                 Medical Decision Making Amount and/or Complexity of Data Reviewed Labs: ordered. Radiology: ordered.   [name] 83 y.o. presented today for right lower leg swelling, decubitus ulcer. Working DDx that I considered at this time includes, but not limited to, decubitus ulcer, Fournier's, abscess, DVT, cellulitis.  R/o DDx: Pending  Review of prior external notes: 12/16/2023 transfer of care  Unique Tests and My Independent Interpretation:  CBC: Pending NWG:NFAOZHY QMV:HQIONGE XBM:WUXLKGM Lactic acid: Pending CT pelvis with contrast:Pending Right lower limb ultrasound DVT study:Pending Pelvis x-ray: Unremarkable EKG: Sinus 98 bpm, no ST elevations or depressions noted, no blocks noted Blood cultures: Pending  Social Determinants of Health: none  Discussion with Independent Historian:  Son  Discussion of Management of Tests: None  Risk: Low: based on diagnostic testing/clinical impression and treatment plan  Risk Stratification Score: None  Plan: On exam patient was in no acute distress with stable vitals.  Patient is a history dementia and cannot provide history and so son was present to provide history.  With a chaperone in the room we rolled the patient and she has a stage IV decubitus ulcer that does have some mild erythema and warmth to the area.  Patient also does have obvious right leg swelling and the entire limb but is neuro vastly intact.  Patient did react when I squeeze on her right calf as well and so we will get ultrasound to rule out DVT as she was recent hospitalized and is bedridden.  Will also obtain CT scan and labs in terms of patient's decubitus ulcer due to the erythema noted along with purulent material.  Patient not currently meet sepsis criteria and so we will withhold antibiotics until we get more information.  The cardiac monitor was ordered secondary to the patient's history of leg swelling and to monitor the patient for dysrhythmia. Cardiac monitor by my independent interpretation showed normal sinus.  Patient signed out to [facility], PA-C.  Please review their note for the continuation of patient's care.  The plan at this point is follow-up on labs and imaging.  If negative may consider discharge with wound care follow-up along with antibiotics.  This chart was dictated using voice recognition software.  Despite best efforts to proofread,  errors can occur which can change the documentation meaning.         Final Clinical Impression(s) / ED Diagnoses Final diagnoses:  None    Rx / DC Orders ED  Discharge Orders     None         Remi Deter 12/28/23 1459    Netta Corrigan, PA-C 12/28/23 1509    Lorre Nick, MD 12/30/23 [phone]

## 2023-12-28 NOTE — Assessment & Plan Note (Addendum)
Worsening.  wound care consult,  Once blood clot burden stabilized would consider obtaining MRI tomorrow to further evaluate for potential presence of osteomyelitis patient may need long-term antibiotics and ID consult For tonight's covered with cefepime Flagyl and vancomycin

## 2023-12-28 NOTE — Progress Notes (Signed)
ANTICOAGULATION CONSULT NOTE - Initial Consult  Pharmacy Consult for Heparin Indication: pulmonary embolus  Allergies  Allergen Reactions   Aspirin Nausea And Vomiting    High doses    Patient Measurements:   Heparin Dosing Weight: 63.8 kg  Vital Signs: Temp: 98.2 F (36.8 C) (02/01 1414) Temp Source: Axillary (02/01 1414) BP: 145/73 (02/01 1755) Pulse Rate: 89 (02/01 1755)  Labs: Recent Labs    12/28/23 1549  HGB 12.6  HCT 39.5  PLT 209  CREATININE 0.92    Estimated Creatinine Clearance: 42.2 mL/min (by C-G formula based on SCr of 0.92 mg/dL).   Medical History: Past Medical History:  Diagnosis Date   Heart murmur    Hyperlipidemia    Hypertension    Osteoporosis    Seasonal allergies     Medications:  (Not in a hospital admission)  Scheduled:  Infusions:  PRN:   Assessment: 73 yof with a history of decubitus ulcer, HTN, dementia. Patient is presenting with rt leg swelling and worsening of decubitus ulcer. Heparin per pharmacy consult placed for pulmonary embolus.  [place] c/w indeterminate DVT of rt common femoral, SF junction, ft femoral vein, rt proximal profunda, rt popliteal  CTA PE exam finished. Formal read is pending.  Patient is not on anticoagulation prior to arrival.  Hgb 12.6; plt 209  Goal of Therapy:  Heparin level 0.3-0.7 units/ml Monitor platelets by anticoagulation protocol: Yes   Plan:  Give IV heparin 4500 units bolus x 1 Start heparin infusion at 1150 units/hr Check anti-Xa level in 8 hours and daily while on heparin Continue to monitor H&H and platelets  Delmar Landau, PharmD, BCPS 12/28/2023 6:21 PM ED Clinical Pharmacist -  [phone]

## 2023-12-28 NOTE — ED Triage Notes (Signed)
Pt here via [facility] for R leg swelling and edema x 1 weeks. 2 weeks post op R mid sacral ulcer repair.  Pt with dementia.  VS stable.

## 2023-12-28 NOTE — Progress Notes (Signed)
Pharmacy Antibiotic Note  [name] is a 83 y.o. female for which pharmacy has been consulted for cefepime and vancomycin dosing for  DFI .  Patient with a history of decubitus ulcer, HTN, dementia. Patient presenting with rt leg swelling and worsening of decubitus ulcer.  SCr 0.92 WBC 16.4; LA 1.9; T 98.2; HR 75; RR 19  Plan: Metronidazole per MD Cefepime 2g q12hr  Vancomycin 1250 mg once then 750 mg q24hr (eAUC 441.8) unless change in renal function Monitor WBC, fever, renal function, cultures De-escalate when able Levels at steady state  Height: 5\' 2"  (157.5 cm) Weight: 66.7 kg (147 lb 0.8 oz) IBW/kg (Calculated) : 50.1  Temp (24hrs), Avg:98.2 F (36.8 C), Min:98.2 F (36.8 C), Max:98.2 F (36.8 C)  Recent Labs  Lab 12/28/23 1549 12/28/23 1559  WBC 16.4*  --   CREATININE 0.92  --   LATICACIDVEN  --  1.9    Estimated Creatinine Clearance: 42.2 mL/min (by C-G formula based on SCr of 0.92 mg/dL).    Allergies  Allergen Reactions   Aspirin Nausea And Vomiting    High doses   Microbiology results: Pending  Thank you for allowing pharmacy to be a part of this patient's care.  Delmar Landau, PharmD, BCPS 12/28/2023 8:49 PM ED Clinical Pharmacist -  [phone]

## 2023-12-28 NOTE — Subjective & Objective (Signed)
Pt with hx od decubitus ulcer and recent admit for treatment done by Dr. Andrey Campanile the wound is chronic and she have had multiple visits to ER for this in the past. Based on his history of dementia and decreased mobility son noticed that her Right leg was swelling and there was increase edema for the past 1 week Patient has of unable to provide any additional history.  But son did notice it has been more purulent material coming out from the wound although no fevers or chills she is bedridden Patient not on any blood thinners

## 2023-12-28 NOTE — ED Notes (Signed)
 Patient transported to CT

## 2023-12-28 NOTE — Progress Notes (Signed)
Bilateral lower extremity venous duplex has been completed.  Results can be found in chart review under CV Proc. And relayed to nurse [facility] 12/28/2023 5:33 PM  [facility], RVT.

## 2023-12-28 NOTE — H&P (Signed)
[name]:[id number] DOB: August 10, 1941 DOA: 12/28/2023     PCP: Noberto Retort, MD     Patient arrived to ER on 12/28/23 at 1344 Referred by Attending Therisa Doyne, MD   Patient coming from:    home Lives  With family    Chief Complaint:   Chief Complaint  Patient presents with   Leg Swelling    HPI: [name] is a 83 y.o. female with medical history significant of dementia decubitus ulcer, HTN HLD heart murmur osteoporosis    Presented with   worsening discharge from the ulcer and right leg swelling Pt with hx od decubitus ulcer and recent admit for treatment done by Dr. Andrey Campanile the wound is chronic and she have had multiple visits to ER for this in the past. Based on his history of dementia and decreased mobility son noticed that her Right leg was swelling and there was increase edema for the past 1 week Patient has of unable to provide any additional history.  But son did notice it has been more purulent material coming out from the wound although no fevers or chills she is bedridden Patient not on any blood thinners   She was not reporting any leg pain or chest pain, family she was bothered by the decubitus ulcer  Up until this week was able to move but this week has been to weak to move Denies significant ETOH intake   Does not smoke   No results found for: "SARSCOV2NAA"      Regarding pertinent Chronic problems:     Hyperlipidemia -  on statins Lipitor (atorvastatin)  Lipid Panel     Component Value Date/Time   CHOL  01/12/2008 0450    172        ATP III CLASSIFICATION:  <200     mg/dL   Desirable  956-213  mg/dL   Borderline High  >=086    mg/dL   High   TRIG 578 46/96/2952 0450   HDL 41 01/12/2008 0450   CHOLHDL 4.2 01/12/2008 0450   VLDL 20 01/12/2008 0450   LDLCALC (H) 01/12/2008 0450    111        Total Cholesterol/HDL:CHD Risk Coronary Heart Disease Risk Table                     Men   Women  1/2 Average Risk   3.4   3.3      HTN on Norvasc, Avapro       Dementia - on  Nemenda, Seroquel, Remeron    While in ER: Doppler showed age-indeterminate DVT of the right common femoral vein SF junction vein for right proximal profunda vein right popliteal vein and peroneal veins   Clinical Course as of 12/28/23 1914  Sat Dec 28, 2023  8413 Dr. Randie Heinz with Vascular will see family however unsure if candidate for thrombectomy given hx [BH]  1854 Dr. Adela Glimpse with medicine to see for admission [BH]    Clinical Course User Index [BH] Henderly, Britni A, PA-C       Lab Orders         Culture, blood (routine x 2)         CBC with Differential         Basic metabolic panel         Sedimentation rate         C-reactive protein         Heparin level (unfractionated)  Heparin level (unfractionated)         I-Stat Lactic Acid       CTabd/pelvis -  Extensive deep vein thrombosis in the right proximal superficial femoral vein, profundal branch, right common femoral vein, right external iliac vein, right common iliac vein, and extending up the IVC to the level of the renal veins. Given the unusual heavy clot burden, catheter thrombectomy should be considered especially if the right lower extremity is symptomatic.   Abnormal cutaneous thickening, subcutaneous stranding, and trace amount of left paracentral gas along the gluteal cleft with density in this vicinity extending to the outer periosteal margin of the sacrum. No clear bony destructive findings to further indicate osteomyelitis, although there is some mild presacral edema and MRI would be more specific with regard to osteomyelitis. The miniscule amount of gas to the left of the sulcus may be spontaneously draining as a cause for the reported purulent discharge. No definite non draining abscess observed.    CTA chest -  Small acute pulmonary emboli in the anterior segmental branch of the right upper lobe pulmonary artery and in the lingular  segmental branch of the left pulmonary artery. No lobar or greater level clot, accordingly RV to left LV ratio measurement is not indicated.   Following Medications were ordered in ER: Medications  heparin ADULT infusion 100 units/mL (25000 units/29mL) (1,150 Units/hr Intravenous New Bag/Given 12/28/23 1835)  morphine (PF) 4 MG/ML injection 4 mg (4 mg Intravenous Given 12/28/23 1637)  iohexol (OMNIPAQUE) 350 MG/ML injection 75 mL (75 mLs Intravenous Contrast Given 12/28/23 1736)  heparin bolus via infusion 4,500 Units (4,500 Units Intravenous Bolus from Bag 12/28/23 1835)    _______________________________________________________ ER Provider Called:       Vascular surgery Dr. Randie Heinz They Recommend admit to medicine   Will see in AM started on heparin     ED Triage Vitals  Encounter Vitals Group     BP 12/28/23 1414 (!) 143/83     Systolic BP Percentile --      Diastolic BP Percentile --      Pulse Rate 12/28/23 1414 87     Resp 12/28/23 1414 18     Temp 12/28/23 1414 98.2 F (36.8 C)     Temp Source 12/28/23 1414 Axillary     SpO2 12/28/23 1414 100 %     Weight 12/28/23 1821 147 lb 0.8 oz (66.7 kg)     Height 12/28/23 1821 5\' 2"  (1.575 m)     Head Circumference --      Peak Flow --      Pain Score 12/28/23 1726 Asleep     Pain Loc --      Pain Education --      Exclude from Growth Chart --   ZOXW(96)@     _________________________________________ Significant initial  Findings: Abnormal Labs Reviewed  CBC WITH DIFFERENTIAL/PLATELET - Abnormal; Notable for the following components:      Result Value   WBC 16.4 (*)    RDW 17.0 (*)    nRBC 0.4 (*)    Neutro Abs 13.6 (*)    Monocytes Absolute 1.1 (*)    Abs Immature Granulocytes 0.20 (*)    All other components within normal limits  BASIC METABOLIC PANEL - Abnormal; Notable for the following components:   Glucose, Bld 107 (*)    All other components within normal limits  C-REACTIVE PROTEIN - Abnormal; Notable for the following  components:   CRP 7.5 (*)  All other components within normal limits      _________________________ Troponin  ordered    ECG: Ordered Personally reviewed and interpreted by me showing: HR : 98 Rhythm:  NSR,    no evidence of ischemic changes QTC 436  BNP (last 3 results) No results for input(s): "BNP" in the last 8760 hours.   COVID-19 Labs  Recent Labs    12/28/23 1549  CRP 7.5*     The recent clinical data is shown below. Vitals:   12/28/23 1700 12/28/23 1715 12/28/23 1755 12/28/23 1821  BP: 99/74 124/71 (!) 145/73   Pulse: 90 90 89   Resp: 18 13 20    Temp:      TempSrc:      SpO2: 99% 99% 100%   Weight:    66.7 kg  Height:    5\' 2"  (1.575 m)    WBC     Component Value Date/Time   WBC 16.4 (H) 12/28/2023 1549   LYMPHSABS 1.3 12/28/2023 1549   MONOABS 1.1 (H) 12/28/2023 1549   EOSABS 0.0 12/28/2023 1549   BASOSABS 0.1 12/28/2023 1549     Lactic Acid, Venous    Component Value Date/Time   LATICACIDVEN 1.9 12/28/2023 1559    Procalcitonin    Ordered      UA  ordered     Results for orders placed or performed during the hospital encounter of 12/15/23  Blood culture (routine x 2)     Status: None   Collection Time: 12/15/23  6:36 PM   Specimen: BLOOD  Result Value Ref Range Status   Specimen Description BLOOD LEFT ANTECUBITAL  Final   Special Requests   Final    BOTTLES DRAWN AEROBIC AND ANAEROBIC Blood Culture results may not be optimal due to an inadequate volume of blood received in culture bottles   Culture   Final    NO GROWTH 5 DAYS Performed at [facility], [address], [place], [place] [address]    Report Status 12/20/2023 FINAL  Final  Blood culture (routine x 2)     Status: None   Collection Time: 12/16/23  4:27 AM   Specimen: BLOOD  Result Value Ref Range Status   Specimen Description BLOOD RIGHT ANTECUBITAL  Final   Special Requests   Final    BOTTLES DRAWN AEROBIC AND ANAEROBIC Blood Culture results may not be  optimal due to an inadequate volume of blood received in culture bottles   Culture   Final    NO GROWTH 5 DAYS Performed at [facility], [address] [place], [place] [address]    Report Status 12/21/2023 FINAL  Final  Aerobic/Anaerobic Culture w Gram Stain (surgical/deep wound)     Status: None   Collection Time: 12/16/23  9:43 AM   Specimen: PATH Skin excision; Tissue  Result Value Ref Range Status   Specimen Description WOUND  Final   Special Requests LEFT DU  Final   Gram Stain   Final    RARE WBC PRESENT, PREDOMINANTLY PMN NO ORGANISMS SEEN    Culture   Final    RARE KLEBSIELLA AEROGENES FEW BACTEROIDES FRAGILIS BETA LACTAMASE POSITIVE Performed at [facility], [address], [place], [place] [address]    Report Status 12/19/2023 FINAL  Final   Organism ID, Bacteria KLEBSIELLA AEROGENES  Final      Susceptibility   Klebsiella aerogenes - MIC*    CEFEPIME <=0.12 SENSITIVE Sensitive     CEFTAZIDIME <=1  SENSITIVE Sensitive     CEFTRIAXONE <=0.25 SENSITIVE Sensitive     CIPROFLOXACIN <=0.25 SENSITIVE Sensitive     GENTAMICIN <=1 SENSITIVE Sensitive     IMIPENEM 0.5 SENSITIVE Sensitive     TRIMETH/SULFA <=20 SENSITIVE Sensitive     PIP/TAZO <=4 SENSITIVE Sensitive ug/mL    * RARE KLEBSIELLA AEROGENES    Susceptibility data from last 90 days. Collected Specimen Info Organism CEFEPIME Ceftazidime CEFTRIAXONE Ciprofloxacin Gentamicin Susc lslt Imipenem Piperacillin + Tazobactam Trimethoprim/Sulfa  12/16/23 Tissue from Wound Klebsiella aerogenes  S  S  S  S  S  S  S  S       Cr  stable,  Lab Results  Component Value Date   CREATININE 0.92 12/28/2023   CREATININE 0.62 12/17/2023   CREATININE 0.61 12/16/2023    Recent Labs  Lab 12/28/23 1549  AST 45*  ALT 60*  ALKPHOS 62  BILITOT 1.0  PROT 6.6  ALBUMIN 2.8*   Lab Results  Component Value Date   CALCIUM 9.2 12/28/2023    Plt: Lab Results  Component Value Date   PLT 209 12/28/2023       Recent Labs  Lab 12/28/23 1549  WBC 16.4*  NEUTROABS 13.6*  HGB 12.6  HCT 39.5  MCV 87.2  PLT 209    HG/HCT   stable,       Component Value Date/Time   HGB 12.6 12/28/2023 1549   HCT 39.5 12/28/2023 1549   MCV 87.2 12/28/2023 1549     _______________________________________________ Hospitalist was called for admission for sacral decubitus ulcer and large DVT with small PE   The following Work up has been ordered so far:  Orders Placed This Encounter  Procedures   Culture, blood (routine x 2)   DG Pelvis Portable   CT Angio Chest PE W and/or Wo Contrast   CT ABDOMEN PELVIS W CONTRAST   CBC with Differential   Basic metabolic panel   Sedimentation rate   C-reactive protein   Heparin level (unfractionated)   Heparin level (unfractionated)   ED Cardiac monitoring   Cardiac Monitoring - Continuous Indefinite   Consult to vascular surgery   heparin per pharmacy consult   Consult to hospitalist   I-Stat Lactic Acid   ED EKG   EKG 12-Lead   Admit to Inpatient (patient's expected length of stay will be greater than 2 midnights or inpatient only procedure)   VAS [place] LOWER EXTREMITY VENOUS (DVT) (7a-7p)     OTHER Significant initial  Findings:  labs showing:      Cultures:    Component Value Date/Time   SDES WOUND 12/16/2023 0943   SPECREQUEST LEFT DU 12/16/2023 0943   CULT  12/16/2023 0943    RARE KLEBSIELLA AEROGENES FEW BACTEROIDES FRAGILIS BETA LACTAMASE POSITIVE Performed at [facility], [address], [place], [place] [address]    REPTSTATUS 12/19/2023 FINAL 12/16/2023 6045     Radiological Exams on Admission: VAS [place] LOWER EXTREMITY VENOUS (DVT) (7a-7p) Result Date: 12/28/2023  Lower Venous DVT Study Patient Name:  [name] [facility]  Date of Exam:   12/28/2023 Medical Rec #: [id number]       Accession #:    [id number] Date of Birth: 24-Oct-1941       Patient Gender: F Patient Age:   75 years Exam Location:  [facility] Procedure:      VAS [place]  LOWER EXTREMITY VENOUS (DVT) Referring Phys: Evlyn Kanner --------------------------------------------------------------------------------  Indications: Swelling, Edema, and Post-op.  Limitations: Combative patient, limited exam. unable to image the contralateral limb. Comparison Study: No prior exam. Performing Technologist: Fernande Bras  Examination Guidelines: A complete evaluation includes B-mode imaging, spectral Doppler, color Doppler, and power Doppler as needed of all accessible portions of each vessel. Bilateral testing is considered an integral part of a complete examination. Limited examinations for reoccurring indications may be performed as noted. The reflux portion of the exam is performed with the patient in reverse Trendelenburg.  +---------+---------------+---------+-----------+----------+-------------------+ RIGHT    CompressibilityPhasicitySpontaneityPropertiesThrombus Aging      +---------+---------------+---------+-----------+----------+-------------------+ CFV      None           No       No                                       +---------+---------------+---------+-----------+----------+-------------------+ SFJ      None                                                             +---------+---------------+---------+-----------+----------+-------------------+ FV Prox  None           No       No                                       +---------+---------------+---------+-----------+----------+-------------------+ FV Mid   None           No       No                                       +---------+---------------+---------+-----------+----------+-------------------+ FV DistalNone           No       No                                       +---------+---------------+---------+-----------+----------+-------------------+ PFV      None           No       No                                        +---------+---------------+---------+-----------+----------+-------------------+ POP      None           No       No                                       +---------+---------------+---------+-----------+----------+-------------------+ PTV                                                   Not well visualized +---------+---------------+---------+-----------+----------+-------------------+ PERO     Partial  No       No                                       +---------+---------------+---------+-----------+----------+-------------------+ GSV      None           Yes      Yes                                      +---------+---------------+---------+-----------+----------+-------------------+   +----+---------------+---------+-----------+----------+--------------+ LEFTCompressibilityPhasicitySpontaneityPropertiesThrombus Aging +----+---------------+---------+-----------+----------+--------------+ CFV Full                                                        +----+---------------+---------+-----------+----------+--------------+    Summary: RIGHT: - Findings consistent with age indeterminate deep vein thrombosis involving the right common femoral vein, SF junction, right femoral vein, right proximal profunda vein, right popliteal vein, and right peroneal veins.    *See table(s) above for measurements and observations.    Preliminary    DG Pelvis Portable Result Date: 12/28/2023 CLINICAL DATA:  Right lower extremity swelling. EXAM: PORTABLE PELVIS 1-2 VIEWS COMPARISON:  February 09, 2009. FINDINGS: There is no evidence of pelvic fracture or diastasis. No pelvic bone lesions are seen. IMPRESSION: Negative. Electronically Signed   By: Lupita Raider M.D.   On: 12/28/2023 14:55   _______________________________________________________________________________________________________ Latest  Blood pressure (!) 145/73, pulse 89, temperature 98.2 F (36.8 C), temperature source  Axillary, resp. rate 20, height 5\' 2"  (1.575 m), weight 66.7 kg, SpO2 100%.   Vitals  labs and radiology finding personally reviewed  Review of Systems:    Pertinent positives include: wound drainage lower extremity swelling  Constitutional:  No weight loss, night sweats, Fevers, chills, fatigue, weight loss  HEENT:  No headaches, Difficulty swallowing,Tooth/dental problems,Sore throat,  No sneezing, itching, ear ache, nasal congestion, post nasal drip,  Cardio-vascular:  No chest pain, Orthopnea, PND, anasarca, dizziness, palpitations.no Bilateral  GI:  No heartburn, indigestion, abdominal pain, nausea, vomiting, diarrhea, change in bowel habits, loss of appetite, melena, blood in stool, hematemesis Resp:  no shortness of breath at rest. No dyspnea on exertion, No excess mucus, no productive cough, No non-productive cough, No coughing up of blood.No change in color of mucus.No wheezing. Skin:  no rash or lesions. No jaundice GU:  no dysuria, change in color of urine, no urgency or frequency. No straining to urinate.  No flank pain.  Musculoskeletal:  No joint pain or no joint swelling. No decreased range of motion. No back pain.  Psych:  No change in mood or affect. No depression or anxiety. No memory loss.  Neuro: no localizing neurological complaints, no tingling, no weakness, no double vision, no gait abnormality, no slurred speech, no confusion  All systems reviewed and apart from HOPI all are negative _______________________________________________________________________________________________ Past Medical History:   Past Medical History:  Diagnosis Date   Heart murmur    Hyperlipidemia    Hypertension    Osteoporosis    Seasonal allergies       Past Surgical History:  Procedure Laterality Date   CATARACT EXTRACTION     FRACTURE SURGERY     INCISION AND DRAINAGE OF WOUND  Left 12/16/2023   Procedure: EXCISIONAL DEBRIDMENT DECUBITUS ULCER;  Surgeon: [name],  MD;  Location: [facility] OR;  Service: General;  Laterality: Left;   ORIF ANKLE FRACTURE     TUBAL LIGATION     WRIST SURGERY      Social History:  Ambulatory   bed bound     reports that she has never smoked. She has never used smokeless tobacco. She reports that she does not drink alcohol and does not use drugs.   Family History:  Family History  Problem Relation Age of Onset   Diabetes Mother    CAD Father    Diabetes Sister    Diabetes Brother    Diabetes Sister    Diabetes Sister    Cancer - Colon Brother    Cancer Brother    Alzheimer's disease Brother    ______________________________________________________________________________________________ Allergies: Allergies  Allergen Reactions   Aspirin Nausea And Vomiting    High doses     Prior to Admission medications   Medication Sig Start Date End Date Taking? Authorizing Provider  acetaminophen (TYLENOL) 500 MG tablet Take 1 tablet (500 mg total) by mouth every 6 (six) hours as needed for fever. 12/18/23   Dezii, Alexandra, DO  atorvastatin (LIPITOR) 40 MG tablet Take 40 mg by mouth daily.    [provider]  calcium-vitamin D (OSCAL) 250-125 MG-UNIT per tablet Take 1 tablet by mouth daily.    [provider]  ibuprofen (ADVIL,MOTRIN) 200 MG tablet Take 200 mg by mouth every 6 (six) hours as needed for moderate pain.     [provider]  memantine (NAMENDA) 10 MG tablet Take 1 tablet (10 mg total) by mouth 2 (two) times daily. 11/06/23     mirtazapine (REMERON) 7.5 MG tablet Take 1 tablet (7.5 mg total) by mouth at bedtime as needed for sleep 11/06/23     Multiple Vitamin (MULTIVITAMIN) tablet Take 1 tablet by mouth daily.    [provider]  QUEtiapine (SEROQUEL) 25 MG tablet Take 1 tablet (25 mg total) by mouth at bedtime as needed for sleep/confusion. Patient taking differently: Take 25 mg by mouth at bedtime. 11/06/23     solifenacin (VESICARE) 5 MG tablet Take 1 tablet (5 mg total)  by mouth every evening. 12/03/23     triamcinolone cream (KENALOG) 0.1 % Apply 1 Application topically daily as needed (for itching). 10/01/23   [provider]    ___________________________________________________________________________________________________ Physical Exam:    12/28/2023    6:21 PM 12/28/2023    5:55 PM 12/28/2023    5:15 PM  Vitals with BMI  Height 5\' 2"     Weight 147 lbs 1 oz    BMI 26.89    Systolic  145 124  Diastolic  73 71  Pulse  89 90     1. General:  in No  Acute distress   Chronically ill   -appearing 2. Psychological: Alert and   Oriented 3. Head/ENT:    Dry Mucous Membranes                          Head Non traumatic, neck supple                            Poor Dentition 4. SKIN:  decreased Skin turgor,  Skin clean Dry due to heavy blood clot burden avoided significant patient manipulation at this time once more stable can  assess sacrum 5. Heart: Regular rate and rhythm no  Murmur, no Rub or gallop 6. Lungs  no wheezes or crackles   7. Abdomen: Soft,  non-tender, Non distended bowel sounds present 8. Lower extremities: no clubbing, cyanosis,  edema on the right 9. Neurologically Grossly intact, moving all 4 extremities equally  10. MSK: Normal range of motion    Chart has been reviewed  ______________________________________________________________________________________________  Assessment/Plan  83 y.o. female with medical history significant of dementia decubitus ulcer, HTN HLD heart murmur osteoporosis  Admitted for PE/DVT, infected  decubitus ulcer  Present on Admission:  Pulmonary embolism without acute cor pulmonale (HCC)  Decubitus ulcer of buttock  HYPERCHOLESTEROLEMIA  HYPERTENSION, BENIGN SYSTEMIC   Decubitus ulcer of buttock Worsening.  wound care consult,  Once blood clot burden stabilized would consider obtaining MRI tomorrow to further evaluate for potential presence of osteomyelitis patient may need long-term  antibiotics and ID consult For tonight's covered with cefepime Flagyl and vancomycin  HYPERCHOLESTEROLEMIA Continue Lipitor 40 mg a day  HYPERTENSION, BENIGN SYSTEMIC Allow permissive hypertension  Pulmonary embolism without acute cor pulmonale (HCC)  Admit to progressive Initiate heparin drip  Would likely benefit from case manager consult for long term anticoagulation Hold home blood pressure medications avoid hypotension Cycle cardiac enzymes Order echogram  lower extremity Doppler showed large clot burden.  Vascular surgery consulted keep n.p.o. in case may need thrombectomy Most likely risk factors for hypercoagulable state being sedentary lifestyle        Other plan as per orders.  DVT prophylaxis:  heparin      Code Status:   DNR/DNI   as per  family  I had personally discussed CODE STATUS with  family   ACP   none   Family Communication:   Family at  Bedside  plan of care was discussed with   Son,  Diet  npo after midnight  Disposition Plan:   To home once workup is complete and patient is stable   Following barriers for discharge:                           PE large DVT stabilized                            Electrolytes corrected                                                             Pain controlled with PO medications                              white count improving able to transition to PO antibiotics                             Will need to be able to tolerate PO                                                       Will need  consultants to evaluate patient prior to discharge       Consult Orders  (From admission, onward)           Start     Ordered   12/28/23 1838  Consult to hospitalist  Paged By Lucendia Herrlich  Once       Provider:  (Not yet assigned)  Question Answer Comment  Place call to: [facility]   Reason for Consult Admit      12/28/23 1837                              Nutrition    consulted                  Wound care   consulted                   Palliative care    consulted               Consults called: vascular surgery     Admission status:  ED Disposition     ED Disposition  Admit   Condition  --   Comment  Hospital [facility]: [facility] [[id number]]  Level of Care: Progressive [102]  Admit to Progressive based on following criteria: CARDIOVASCULAR & THORACIC of moderate stability with acute coronary syndrome symptoms/low risk myocardial infarction/hypertensive urgency/arrhythmias/heart failure potentially compromising stability and stable post cardiovascular intervention patients.  May admit patient to [facility] or [facility] if equivalent level of care is available:: No  Covid Evaluation: Asymptomatic - no recent exposure (last 10 days) testing not required  Diagnosis: Pulmonary embolism without acute cor pulmonale [facility]) [[id number]]  Admitting Physician: Therisa Doyne [[id number]]  Attending Physician: Therisa Doyne [[id number]]  Certification:: I certify this patient will need inpatient services for at least 2 midnights  Expected Medical Readiness: 12/31/2023           inpatient     I Expect 2 midnight stay secondary to severity of patient's current illness need for inpatient interventions justified by the following:     Severe lab/radiological/exam abnormalities including:    Ne pE and extensive comorbidities including:  Dementia  That are currently affecting medical management.   I expect  patient to be hospitalized for 2 midnights requiring inpatient medical care.  Patient is at high risk for adverse outcome (such as loss of life or disability) if not treated.  Indication for inpatient stay as follows:   inability to maintain oral hydration   Need for operative/procedural  intervention    Need for IV  heparin   Level of care    progressive     Therisa Doyne 12/28/2023, 8:49 PM     [facility]     after 2 AM please page floor coverage PA If  7AM-7PM, please contact the day team taking care of the patient using [email or web address]

## 2023-12-28 NOTE — Assessment & Plan Note (Signed)
Allow permissive hypertension 

## 2023-12-28 NOTE — Assessment & Plan Note (Signed)
Admit to progressive Initiate heparin drip  Would likely benefit from case manager consult for long term anticoagulation Hold home blood pressure medications avoid hypotension Cycle cardiac enzymes Order echogram  lower extremity Doppler showed large clot burden.  Vascular surgery consulted keep n.p.o. in case may need thrombectomy Most likely risk factors for hypercoagulable state being sedentary lifestyle

## 2023-12-29 ENCOUNTER — Encounter (HOSPITAL_COMMUNITY): Payer: Self-pay | Admitting: Family Medicine

## 2023-12-29 DIAGNOSIS — O2230 Deep phlebothrombosis in pregnancy, unspecified trimester: Secondary | ICD-10-CM | POA: Diagnosis present

## 2023-12-29 DIAGNOSIS — I2699 Other pulmonary embolism without acute cor pulmonale: Secondary | ICD-10-CM | POA: Diagnosis not present

## 2023-12-29 DIAGNOSIS — I82491 Acute embolism and thrombosis of other specified deep vein of right lower extremity: Secondary | ICD-10-CM

## 2023-12-29 DIAGNOSIS — L89300 Pressure ulcer of unspecified buttock, unstageable: Secondary | ICD-10-CM | POA: Diagnosis not present

## 2023-12-29 LAB — BLOOD CULTURE ID PANEL (REFLEXED) - BCID2

## 2023-12-29 LAB — HEPARIN LEVEL (UNFRACTIONATED)
Heparin Unfractionated: >1.1 [IU]/mL — ABNORMAL HIGH (ref 0.30–0.70)
Heparin Unfractionated: >1.1 [IU]/mL — ABNORMAL HIGH (ref 0.30–0.70)

## 2023-12-29 LAB — PREALBUMIN: Prealbumin: 12 mg/dL — ABNORMAL LOW (ref 18–38)

## 2023-12-29 LAB — TROPONIN I (HIGH SENSITIVITY): Troponin I (High Sensitivity): 15 ng/L (ref <18)

## 2023-12-29 MED ORDER — MORPHINE SULFATE (PF) 2 MG/ML IV SOLN: 2.0000 mg | INTRAVENOUS | Status: DC | PRN

## 2023-12-29 MED ORDER — AMOXICILLIN-POT CLAVULANATE 875-125 MG PO TABS
1.0000 | ORAL_TABLET | Freq: Two times a day (BID) | ORAL | Status: DC
  Administered 2023-12-30 – 2024-01-03 (×9): 1 via ORAL
  Filled 2023-12-29 (×9): qty 1

## 2023-12-29 MED ORDER — SODIUM CHLORIDE 0.9 % IV SOLN
INTRAVENOUS | Status: DC
Start: 2023-12-29 — End: 2023-12-29

## 2023-12-29 MED ORDER — HYDROCODONE-ACETAMINOPHEN 5-325 MG PO TABS: 1.0000 | ORAL_TABLET | ORAL | Status: DC | PRN

## 2023-12-29 MED ORDER — QUETIAPINE FUMARATE 25 MG PO TABS: 25.0000 mg | ORAL_TABLET | Freq: Every day | ORAL | Status: DC

## 2023-12-29 MED ORDER — ONDANSETRON HCL 4 MG/2ML IJ SOLN: 4.0000 mg | Freq: Four times a day (QID) | INTRAMUSCULAR | Status: DC | PRN

## 2023-12-29 MED ORDER — APIXABAN 5 MG PO TABS
10.0000 mg | ORAL_TABLET | Freq: Two times a day (BID) | ORAL | Status: DC
  Administered 2023-12-29 – 2023-12-30 (×2): 10 mg via ORAL
  Filled 2023-12-29 (×2): qty 2

## 2023-12-29 MED ORDER — ACETAMINOPHEN 325 MG PO TABS
650.0000 mg | ORAL_TABLET | Freq: Four times a day (QID) | ORAL | Status: DC | PRN
  Filled 2023-12-29: qty 2

## 2023-12-29 MED ORDER — QUETIAPINE FUMARATE 25 MG PO TABS
25.0000 mg | ORAL_TABLET | Freq: Every day | ORAL | Status: DC
Start: 2023-12-29 — End: 2024-01-04
  Administered 2023-12-30 – 2024-01-03 (×5): 25 mg via ORAL
  Filled 2023-12-29 (×6): qty 1

## 2023-12-29 MED ORDER — ACETAMINOPHEN 650 MG RE SUPP: 650.0000 mg | Freq: Four times a day (QID) | RECTAL | Status: DC | PRN

## 2023-12-29 MED ORDER — APIXABAN 5 MG PO TABS
5.0000 mg | ORAL_TABLET | Freq: Two times a day (BID) | ORAL | Status: DC
Start: 2024-01-05 — End: 2023-12-30

## 2023-12-29 MED ORDER — FESOTERODINE FUMARATE ER 4 MG PO TB24
4.0000 mg | ORAL_TABLET | Freq: Every day | ORAL | Status: DC
  Administered 2023-12-30 – 2024-01-03 (×5): 4 mg via ORAL
  Filled 2023-12-29 (×5): qty 1

## 2023-12-29 MED ORDER — HEPARIN (PORCINE) 25000 UT/250ML-% IV SOLN: 900.0000 [IU]/h | INTRAVENOUS | Status: DC

## 2023-12-29 MED ORDER — MEMANTINE HCL 10 MG PO TABS
10.0000 mg | ORAL_TABLET | Freq: Two times a day (BID) | ORAL | Status: DC
Start: 2023-12-30 — End: 2024-01-04
  Administered 2023-12-30 – 2024-01-03 (×9): 10 mg via ORAL
  Filled 2023-12-29 (×9): qty 1

## 2023-12-29 MED ORDER — MIRTAZAPINE 15 MG PO TABS
7.5000 mg | ORAL_TABLET | Freq: Every evening | ORAL | Status: DC | PRN
  Filled 2023-12-29: qty 1

## 2023-12-29 MED ORDER — ONDANSETRON HCL 4 MG PO TABS: 4.0000 mg | ORAL_TABLET | Freq: Four times a day (QID) | ORAL | Status: DC | PRN

## 2023-12-29 MED ORDER — ATORVASTATIN CALCIUM 40 MG PO TABS
40.0000 mg | ORAL_TABLET | Freq: Every day | ORAL | Status: DC
  Administered 2023-12-30 – 2024-01-03 (×5): 40 mg via ORAL
  Filled 2023-12-29 (×6): qty 1

## 2023-12-29 MED ORDER — HEPARIN (PORCINE) 25000 UT/250ML-% IV SOLN: 750.0000 [IU]/h | INTRAVENOUS | Status: DC

## 2023-12-29 NOTE — Progress Notes (Signed)
PHARMACY - ANTICOAGULATION CONSULT NOTE  Pharmacy Consult for heparin Indication:  extensive DVT and small acute PE  Labs: Recent Labs    12/28/23 1549 12/28/23 2017 12/29/23 0050 12/29/23 0333  HGB 12.6  --   --   --   HCT 39.5  --   --   --   PLT 209  --   --   --   HEPARINUNFRC  --   --   --  >1.10*  CREATININE 0.92  --   --   --   CKTOTAL 204  --   --   --   TROPONINIHS  --  15 15  --    Assessment: 82yo female supratherapeutic on heparin with initial dosing for extensive DVT and small PE; no infusion issues or signs of bleeding per RN.  Goal of Therapy:  Heparin level 0.3-0.7 units/ml   Plan:  Hold heparin infusion x1h then resume heparin infusion at 4 units/kg/hr lower rate of 900 units/hr. Check level in 8 hours.   Vernard Gambles, PharmD, BCPS 12/29/2023 5:06 AM

## 2023-12-29 NOTE — Plan of Care (Signed)

## 2023-12-29 NOTE — Consult Note (Signed)
WOC Nurse Consult Note: Reason for Consult: Requested to assess a sacral wound. Performed remotely by assessing photos and notes. Pt was recent admitted for a sacral wound debridement. Wound type: Pressure injury stage 3 on sacrum. Pressure Injury POA: Yes Measurement: aprox. 5.5 cm x 6 cm x 1cm. Wound bed: 60% yellow, 30% red, 10% black 6 o'clock position. Drainage (amount, consistency, odor) moderate amount. Periwound: old scar surrounding, dark skin, intact. Dressing procedure/placement/frequency: Apply Aquacel on the wound bed, cover with foam dressing, change every 3 days or before, if is saturated or PRN soiling.  WOC team will not plan to follow further.  Please reconsult if further assistance is needed. Thank-you,  Denyse Amass BSN, RN, [employer], WOC  (Pager: [phone])

## 2023-12-29 NOTE — Progress Notes (Signed)
Progress Note   Patient: [name] ZOX:[id number] DOB: 05-20-1941 DOA: 12/28/2023     1 DOS: the patient was seen and examined on 12/29/2023 at 4:30PM      Brief hospital course: 83 y.o. F with advanced dementia, lives at home, speech limited to short phrases/nonsensical and mostly nonambulatory at baseline, recent admission for infected sacral decubitus ulcer requiring surgical debridement who presented with leg swelling.  Found to have extensive DVT to the iliacs and IVC as well as small PE.  Vascular surgery consulted and admitted on heparin.      Assessment and Plan:  Extensive right lower extremity DVT Pulmonary embolism, acute Discussed with vascular surgery, despite the extensive nature, her swelling does not appear to be particular symptomatic at present, and with her limited ambulatory status, advanced dementia, risks of intervention outweigh possible benefits, and vascular surgery recommended against thrombectomy. -Transition to Eliquis -Mobility as tolerated   Stage IV decubitus ulcer, present on admission Possible mild wound infection There was some concern for infection on presentation, but procalcitonin negative, there is no purulence in the pictures yesterday, nor is there any purulence today.  No surrounding erythema, induration, or drainage.  CT showed no evidence of osteomyelitis, given her dementia, she is unfortunately unable to sit still enough for MRI.  I actually have low suspicion for infection, but feel that short course hedges our bets. -Consult wound care - Daily wet-to-dry dressings - Transition antibiotics to oral for 5 day course  Dementia - Continue Seroquel -Resume home Vesicare and mirtazapine  Hypertension Blood pressure normal, not on medication at home  Hypercholesterolemia -Continue Lipitor  Positive blood culture Coag negative staph in 1 of 2 blood cultures, suspect this is contaminant. - Follow second blood  culture       Subjective: The patient is nonverbal.  Her son reports she has been uncomfortable when moved, otherwise has been resting comfortably.  She has had no fever, respiratory symptoms.     Physical Exam: BP 126/68 (BP Location: Right Arm)   Pulse (!) 102   Temp 98 F (36.7 C) (Oral)   Resp 19   Ht 5\' 2"  (1.575 m)   Wt 66.7 kg   LMP  (LMP Unknown)   SpO2 100%   BMI 26.90 kg/m   Older adult female, lying in bed, somnolent, opens eyes when touched, but does not cooperate with exam otherwise Tachycardic, regular, no murmurs, no peripheral edema Respiratory rate normal, lungs clear without rales or wheezes Abdomen soft no tenderness palpation or guarding, no ascites or distention Opens eyes when manipulated, then closes them, she makes no spontaneous verbalizations, but she protests when moved, she is not oriented to self, situation, does not recognize son, moves upper extremities with normal strength and coordination, lower extremities not tested due to poor cooperation There is a sacral ulcer as pictured below, this has some fibrinous material at the base, but no purulence, no surrounding erythema or induration    Data Reviewed: Discussed with vascular surgery Heparin level elevated LFTs slightly elevated Electrolytes and renal function normal Troponin and CK normal Procalcitonin undetectable CRP slightly elevated White blood cell count 16 Blood cultures with Staph epidermidis in 1 of 2 CT abdomen and pelvis reviewed, shows extensive DVT up to the IVC to the level of the renal veins, as well as small acute PE, as well as cutaneous thickening around the gluteal cleft, no bony destructive findings    Family Communication: Son at the bedside    Disposition:  Status is: Inpatient Patient was admitted with DVT, small PE, as well as concern for infection of her wound  It is my impression that the wound is actually not very infected, and that her main concern is the  DVT.  Vascular were consulted, and recommended no intervention.  Family actually have a meeting with our care to discuss hospice care tomorrow, and so my recommendation is that we transition to Eliquis, make sure that she has all equipment she needs at home, and if she is able to tolerate diet tomorrow morning, discharged with a few more days of oral antibiotics, and follow-up with hospice        Author: Alberteen Sam, MD 12/29/2023 5:13 PM  For on call review [email or web address].

## 2023-12-29 NOTE — Progress Notes (Signed)
PHARMACY - PHYSICIAN COMMUNICATION CRITICAL VALUE ALERT - BLOOD CULTURE IDENTIFICATION (BCID)  [name] is an 83 y.o. female who presented to [facility] on 12/28/2023 with a chief complaint of worsening discharge from the ulcer and right leg swelling   Assessment:  1 of 4 Staph epi   Name of physician (or Provider) Contacted: [place]  Current antibiotics: Vancomycin, cefepime, flagyl  Changes to prescribed antibiotics recommended:  Patient is on recommended antibiotics - No changes needed  Results for orders placed or performed during the hospital encounter of 12/28/23  Blood Culture ID Panel (Reflexed) (Collected: 12/28/2023  3:52 PM)  Result Value Ref Range   Enterococcus faecalis NOT DETECTED NOT DETECTED   Enterococcus Faecium NOT DETECTED NOT DETECTED   Listeria monocytogenes NOT DETECTED NOT DETECTED   Staphylococcus species DETECTED (A) NOT DETECTED   Staphylococcus aureus (BCID) NOT DETECTED NOT DETECTED   Staphylococcus epidermidis DETECTED (A) NOT DETECTED   Staphylococcus lugdunensis NOT DETECTED NOT DETECTED   Streptococcus species NOT DETECTED NOT DETECTED   Streptococcus agalactiae NOT DETECTED NOT DETECTED   Streptococcus pneumoniae NOT DETECTED NOT DETECTED   Streptococcus pyogenes NOT DETECTED NOT DETECTED   A.calcoaceticus-baumannii NOT DETECTED NOT DETECTED   Bacteroides fragilis NOT DETECTED NOT DETECTED   Enterobacterales NOT DETECTED NOT DETECTED   Enterobacter cloacae complex NOT DETECTED NOT DETECTED   Escherichia coli NOT DETECTED NOT DETECTED   Klebsiella aerogenes NOT DETECTED NOT DETECTED   Klebsiella oxytoca NOT DETECTED NOT DETECTED   Klebsiella pneumoniae NOT DETECTED NOT DETECTED   Proteus species NOT DETECTED NOT DETECTED   Salmonella species NOT DETECTED NOT DETECTED   Serratia marcescens NOT DETECTED NOT DETECTED   Haemophilus influenzae NOT DETECTED NOT DETECTED   Neisseria meningitidis NOT DETECTED NOT DETECTED   Pseudomonas aeruginosa  NOT DETECTED NOT DETECTED   Stenotrophomonas maltophilia NOT DETECTED NOT DETECTED   Candida albicans NOT DETECTED NOT DETECTED   Candida auris NOT DETECTED NOT DETECTED   Candida glabrata NOT DETECTED NOT DETECTED   Candida krusei NOT DETECTED NOT DETECTED   Candida parapsilosis NOT DETECTED NOT DETECTED   Candida tropicalis NOT DETECTED NOT DETECTED   Cryptococcus neoformans/gattii NOT DETECTED NOT DETECTED   Methicillin resistance mecA/C DETECTED (A) NOT DETECTED    Toniann Fail Xana Bradt 12/29/2023  3:44 PM

## 2023-12-29 NOTE — Consult Note (Addendum)
Hospital Consult    Reason for Consult:  extensive dvt Referring Physician:  Dr. Maryfrances Bunnell MRN #:  [id number]  History of Present Illness: This is a 83 y.o. female history of dementia with recent admission for debridement of sacral decubitus ulcer.  At the time my evaluation patient is alone but per chart was noted to have an edematous leg yesterday presented to the emergency department found to have extensive right lower extremity DVT extending into the IVC for which I am now consulted.  Patient cannot give me any history due to what appears to be advanced dementia.  Past Medical History:  Diagnosis Date   Heart murmur    Hyperlipidemia    Hypertension    Osteoporosis    Seasonal allergies     Past Surgical History:  Procedure Laterality Date   CATARACT EXTRACTION     FRACTURE SURGERY     INCISION AND DRAINAGE OF WOUND Left 12/16/2023   Procedure: EXCISIONAL DEBRIDMENT DECUBITUS ULCER;  Surgeon: [name], MD;  Location: [facility] OR;  Service: General;  Laterality: Left;   ORIF ANKLE FRACTURE     TUBAL LIGATION     WRIST SURGERY      Allergies  Allergen Reactions   Aspirin Nausea And Vomiting    High doses    Prior to Admission medications   Medication Sig Start Date End Date Taking? Authorizing Provider  acetaminophen (TYLENOL) 500 MG tablet Take 1 tablet (500 mg total) by mouth every 6 (six) hours as needed for fever. 12/18/23   Dezii, Alexandra, DO  atorvastatin (LIPITOR) 40 MG tablet Take 40 mg by mouth daily.    [provider]  calcium-vitamin D (OSCAL) 250-125 MG-UNIT per tablet Take 1 tablet by mouth daily.    [provider]  ibuprofen (ADVIL,MOTRIN) 200 MG tablet Take 200 mg by mouth every 6 (six) hours as needed for moderate pain.     [provider]  memantine (NAMENDA) 10 MG tablet Take 1 tablet (10 mg total) by mouth 2 (two) times daily. 11/06/23     mirtazapine (REMERON) 7.5 MG tablet Take 1 tablet (7.5 mg total) by mouth at bedtime as  needed for sleep 11/06/23     Multiple Vitamin (MULTIVITAMIN) tablet Take 1 tablet by mouth daily.    [provider]  QUEtiapine (SEROQUEL) 25 MG tablet Take 1 tablet (25 mg total) by mouth at bedtime as needed for sleep/confusion. Patient taking differently: Take 25 mg by mouth at bedtime. 11/06/23     solifenacin (VESICARE) 5 MG tablet Take 1 tablet (5 mg total) by mouth every evening. 12/03/23     triamcinolone cream (KENALOG) 0.1 % Apply 1 Application topically daily as needed (for itching). 10/01/23   [provider]    Social History   Socioeconomic History   Marital status: Widowed    Spouse name: Not on file   Number of children: Not on file   Years of education: Not on file   Highest education level: Not on file  Occupational History   Not on file  Tobacco Use   Smoking status: Never   Smokeless tobacco: Never  Vaping Use   Vaping status: Never Used  Substance and Sexual Activity   Alcohol use: No   Drug use: No   Sexual activity: Not on file  Other Topics Concern   Not on file  Social History Narrative   Not on file   Social Drivers of Health   Financial Resource Strain: Not on file  Food Insecurity: Patient Unable To Answer (12/16/2023)   Hunger Vital Sign    Worried About Running Out of Food in the Last Year: Patient unable to answer    Ran Out of Food in the Last Year: Patient unable to answer  Transportation Needs: Patient Unable To Answer (12/16/2023)   PRAPARE - Transportation    Lack of Transportation (Medical): Patient unable to answer    Lack of Transportation (Non-Medical): Patient unable to answer  Physical Activity: Not on file  Stress: Not on file  Social Connections: Unknown (12/16/2023)   Social Connection and Isolation Panel [NHANES]    Frequency of Communication with Friends and Family: Patient unable to answer    Frequency of Social Gatherings with Friends and Family: Patient unable to answer    Attends Religious Services: Not  on file    Active Member of Clubs or Organizations: Patient unable to answer    Attends Banker Meetings: Patient unable to answer    Marital Status: Patient unable to answer  Intimate Partner Violence: Patient Unable To Answer (12/16/2023)   Humiliation, Afraid, Rape, and Kick questionnaire    Fear of Current or Ex-Partner: Patient unable to answer    Emotionally Abused: Patient unable to answer    Physically Abused: Patient unable to answer    Sexually Abused: Patient unable to answer     Family History  Problem Relation Age of Onset   Diabetes Mother    CAD Father    Diabetes Sister    Diabetes Brother    Diabetes Sister    Diabetes Sister    Cancer - Colon Brother    Cancer Brother    Alzheimer's disease Brother     ROS Could not be obtained    Physical Examination  Vitals:   12/29/23 0500 12/29/23 0600  BP: 132/68 136/68  Pulse: 76 77  Resp: 19 15  Temp:    SpO2: 100% 100%   Body mass index is 26.9 kg/m.  Physical Exam Patient is awake and responds to questions but she is not oriented to person place or time She has nonlabored respirations The right lower extremity is edematous although all compartments are soft and her right foot is warm and well-perfused  CBC    Component Value Date/Time   WBC 16.4 (H) 12/28/2023 1549   RBC 4.53 12/28/2023 1549   HGB 12.6 12/28/2023 1549   HCT 39.5 12/28/2023 1549   PLT 209 12/28/2023 1549   MCV 87.2 12/28/2023 1549   MCH 27.8 12/28/2023 1549   MCHC 31.9 12/28/2023 1549   RDW 17.0 (H) 12/28/2023 1549   LYMPHSABS 1.3 12/28/2023 1549   MONOABS 1.1 (H) 12/28/2023 1549   EOSABS 0.0 12/28/2023 1549   BASOSABS 0.1 12/28/2023 1549    BMET    Component Value Date/Time   NA 143 12/28/2023 1549   K 3.6 12/28/2023 1549   CL 105 12/28/2023 1549   CO2 23 12/28/2023 1549   GLUCOSE 107 (H) 12/28/2023 1549   BUN 15 12/28/2023 1549   CREATININE 0.92 12/28/2023 1549   CALCIUM 9.2 12/28/2023 1549   GFRNONAA  >60 12/28/2023 1549   GFRAA >60 07/31/2017 0152    COAGS: No results found for: "INR", "PROTIME"   Non-Invasive Vascular Imaging:   RIGHT    CompressibilityPhasicitySpontaneityPropertiesThrombus Aging        +---------+---------------+---------+-----------+----------+---------------  ----+  CFV     None           No  No                                         +---------+---------------+---------+-----------+----------+---------------  ----+  SFJ     None                                                               +---------+---------------+---------+-----------+----------+---------------  ----+  FV Prox  None           No       No                                         +---------+---------------+---------+-----------+----------+---------------  ----+  FV Mid   None           No       No                                         +---------+---------------+---------+-----------+----------+---------------  ----+  FV DistalNone           No       No                                         +---------+---------------+---------+-----------+----------+---------------  ----+  PFV     None           No       No                                         +---------+---------------+---------+-----------+----------+---------------  ----+  POP     None           No       No                                         +---------+---------------+---------+-----------+----------+---------------  ----+  PTV                                                  Not well  visualized  +---------+---------------+---------+-----------+----------+---------------  ----+  PERO    Partial        No       No                                         +---------+---------------+---------+-----------+----------+---------------  ----+  GSV     None           Yes      Yes                                         +---------+---------------+---------+-----------+----------+---------------  ----+         +----+---------------+---------+-----------+----------+--------------+  LEFTCompressibilityPhasicitySpontaneityPropertiesThrombus Aging  +----+---------------+---------+-----------+----------+--------------+  CFV Full                                                         +----+---------------+---------+-----------+----------+--------------+           Summary:  RIGHT:   - Findings consistent with age indeterminate deep vein thrombosis  involving the right common femoral vein, SF junction, right femoral vein,  right proximal profunda vein, right popliteal vein, and right peroneal  veins.   CT IMPRESSION: 1. 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 Given the unusual heavy clot burden, catheter thrombectomy should be considered especially if the right lower extremity is symptomatic. 2. Small acute pulmonary emboli in the anterior segmental branch of the right upper lobe pulmonary artery and in the lingular segmental branch of the left pulmonary artery. No lobar or greater level clot, accordingly RV to left LV ratio measurement is not indicated. 3. Abnormal cutaneous thickening, subcutaneous stranding, and trace amount of left paracentral gas along the gluteal cleft with density in this vicinity extending to the outer periosteal margin of the sacrum. No clear bony destructive findings to further indicate osteomyelitis, although there is some mild presacral edema and MRI would be more specific with regard to osteomyelitis. The miniscule amount of gas to the left of the sulcus may be spontaneously draining as a cause for the reported purulent discharge. No definite non draining  abscess observed. 4. Substantial subcutaneous edema in the right upper thigh, most likely related to the DVT. 5. Mild prominence of stool in the proximal colon. 6. Calcified uterine fibroids. 7. Aortic atherosclerosis.  ASSESSMENT/PLAN: This is a 83 y.o. female with extensive DVT extending into her IVC.  Patient appears to have advanced dementia and unlikely that she ambulates given recent sacral decubitus ulcer debridement.  She is currently admitted to the hospitalist and I have discussed with Dr. Maryfrances Bunnell I do not think she is a good candidate for any surgical or endovascular intervention of her IVC and right lower extremity extensive clot.  I have attempted to reach out to her family to no avail and will continue to attempt to discuss the patient's case with them.  Certainly if the patient is more awake and alert I am happy to discuss her findings with her and family unlikely she would require any intervention.  Xylia Scherger C. Randie Heinz, MD [facility] Office: [phone] Pager: [phone]  Addendum:  Patient evaluated again on 2 W. with son at bedside.  In talking with him and patient she has severe advanced dementia he was 100% care by son and daughter-in-law.  Her right lower extremity edema has resolved at this point and she does not appear to have any pain.  As such she does not require any intervention and would recommend long-term anticoagulation given nonmodifiable risk factors.  Vascular surgery will be available as needed.  Lemar Livings, MD

## 2023-12-29 NOTE — Progress Notes (Signed)
ANTICOAGULATION CONSULT NOTE - Initial Consult  Pharmacy Consult for Heparin Indication: pulmonary embolus  Allergies  Allergen Reactions   Aspirin Nausea And Vomiting    High doses    Patient Measurements: Height: 5\' 2"  (157.5 cm) Weight: 66.7 kg (147 lb 0.8 oz) IBW/kg (Calculated) : 50.1 Heparin Dosing Weight: 63.8 kg  Vital Signs: Temp: 98 F (36.7 C) (02/02 1423) Temp Source: Oral (02/02 1423) BP: 126/68 (02/02 1423) Pulse Rate: 102 (02/02 1423)  Labs: Recent Labs    12/28/23 1549 12/28/23 2017 12/29/23 0050 12/29/23 0333 12/29/23 1449  HGB 12.6  --   --   --   --   HCT 39.5  --   --   --   --   PLT 209  --   --   --   --   HEPARINUNFRC  --   --   --  >1.10* >1.10*  CREATININE 0.92  --   --   --   --   CKTOTAL 204  --   --   --   --   TROPONINIHS  --  15 15  --   --     Estimated Creatinine Clearance: 42.2 mL/min (by C-G formula based on SCr of 0.92 mg/dL).   Medical History: Past Medical History:  Diagnosis Date   Heart murmur    Hyperlipidemia    Hypertension    Osteoporosis    Seasonal allergies     Medications:  Medications Prior to Admission  Medication Sig Dispense Refill Last Dose/Taking   acetaminophen (TYLENOL) 500 MG tablet Take 1 tablet (500 mg total) by mouth every 6 (six) hours as needed for fever. 30 tablet 0 Taking As Needed   atorvastatin (LIPITOR) 40 MG tablet Take 40 mg by mouth daily.   12/28/2023   calcium-vitamin D (OSCAL) 250-125 MG-UNIT per tablet Take 1 tablet by mouth daily.   12/28/2023   memantine (NAMENDA) 10 MG tablet Take 1 tablet (10 mg total) by mouth 2 (two) times daily. 180 tablet 1 12/28/2023   mirtazapine (REMERON) 7.5 MG tablet Take 1 tablet (7.5 mg total) by mouth at bedtime as needed for sleep 90 tablet 1 12/28/2023   Multiple Vitamin (MULTIVITAMIN) tablet Take 1 tablet by mouth daily.   12/28/2023   QUEtiapine (SEROQUEL) 25 MG tablet Take 1 tablet (25 mg total) by mouth at bedtime as needed for sleep/confusion. (Patient  taking differently: Take 25 mg by mouth at bedtime.) 90 tablet 1 12/28/2023   solifenacin (VESICARE) 5 MG tablet Take 1 tablet (5 mg total) by mouth every evening. 90 tablet 1 12/28/2023   triamcinolone cream (KENALOG) 0.1 % Apply 1 Application topically daily as needed (for itching).   Taking As Needed   Scheduled:  Infusions:   ceFEPime (MAXIPIME) IV Stopped (12/29/23 1038)   heparin 900 Units/hr (12/29/23 0601)   metronidazole Stopped (12/29/23 1038)   vancomycin     PRN:   Assessment: 82 yof with a history of decubitus ulcer, HTN, dementia. Patient is presenting with rt leg swelling and worsening of decubitus ulcer. Heparin per pharmacy consult placed for pulmonary embolus.  [place] c/w indeterminate DVT of rt common femoral, SF junction, ft femoral vein, rt proximal profunda, rt popliteal  PM: heparin level > 1.1 again on 750 units/hr. Confirmed with phlebotomist that level was drawn from opposite arm as heparin infusion. No bleeding complications reported.  Goal of Therapy:  Heparin level 0.3-0.7 units/ml Monitor platelets by anticoagulation protocol: Yes   Plan:  Hold  heparin drip x 1 hour Decrease heparin infusion to 750 units/hr Check anti-Xa level in 8 hours and daily while on heparin Continue to monitor H&H and platelets  Loralee Pacas, PharmD, BCPS 12/29/2023 3:38 PM  Please check AMION for all [facility] phone numbers After 10:00 PM, call Main Pharmacy [phone]

## 2023-12-30 ENCOUNTER — Encounter (HOSPITAL_COMMUNITY): Payer: Self-pay | Admitting: Family Medicine

## 2023-12-30 DIAGNOSIS — I2699 Other pulmonary embolism without acute cor pulmonale: Secondary | ICD-10-CM | POA: Diagnosis not present

## 2023-12-30 LAB — CBC
HCT: 33.5 % — ABNORMAL LOW (ref 36.0–46.0)
Hemoglobin: 10.7 g/dL — ABNORMAL LOW (ref 12.0–15.0)
MCH: 27.6 pg (ref 26.0–34.0)
MCHC: 31.9 g/dL (ref 30.0–36.0)
MCV: 86.6 fL (ref 80.0–100.0)
Platelets: 267 10*3/uL (ref 150–400)
RBC: 3.87 MIL/uL (ref 3.87–5.11)
RDW: 17.5 % — ABNORMAL HIGH (ref 11.5–15.5)
WBC: 24.1 10*3/uL — ABNORMAL HIGH (ref 4.0–10.5)
nRBC: 0.8 % — ABNORMAL HIGH (ref 0.0–0.2)

## 2023-12-30 LAB — CULTURE, BLOOD (ROUTINE X 2)

## 2023-12-30 LAB — COMPREHENSIVE METABOLIC PANEL
ALT: 39 U/L (ref 0–44)
AST: 29 U/L (ref 15–41)
Albumin: 2.5 g/dL — ABNORMAL LOW (ref 3.5–5.0)
Alkaline Phosphatase: 54 U/L (ref 38–126)
Anion gap: 11 (ref 5–15)
BUN: 37 mg/dL — ABNORMAL HIGH (ref 8–23)
CO2: 21 mmol/L — ABNORMAL LOW (ref 22–32)
Calcium: 8.7 mg/dL — ABNORMAL LOW (ref 8.9–10.3)
Chloride: 113 mmol/L — ABNORMAL HIGH (ref 98–111)
Creatinine, Ser: 0.96 mg/dL (ref 0.44–1.00)
GFR, Estimated: 59 mL/min — ABNORMAL LOW (ref >60)
Glucose, Bld: 178 mg/dL — ABNORMAL HIGH (ref 70–99)
Potassium: 4.1 mmol/L (ref 3.5–5.1)
Sodium: 145 mmol/L (ref 135–145)
Total Bilirubin: 0.6 mg/dL (ref 0.0–1.2)
Total Protein: 5.7 g/dL — ABNORMAL LOW (ref 6.5–8.1)

## 2023-12-30 LAB — HEMOGLOBIN AND HEMATOCRIT, BLOOD
HCT: 31.4 % — ABNORMAL LOW (ref 36.0–46.0)
HCT: 29.1 % — ABNORMAL LOW (ref 36.0–46.0)
Hemoglobin: 10.1 g/dL — ABNORMAL LOW (ref 12.0–15.0)
Hemoglobin: 9.4 g/dL — ABNORMAL LOW (ref 12.0–15.0)

## 2023-12-30 MED ORDER — ADULT MULTIVITAMIN W/MINERALS CH
1.0000 | ORAL_TABLET | Freq: Every day | ORAL | Status: DC
  Administered 2023-12-30 – 2024-01-03 (×5): 1 via ORAL
  Filled 2023-12-30 (×5): qty 1

## 2023-12-30 MED ORDER — PANTOPRAZOLE SODIUM 40 MG IV SOLR
40.0000 mg | Freq: Two times a day (BID) | INTRAVENOUS | Status: DC
  Administered 2023-12-30 – 2024-01-03 (×9): 40 mg via INTRAVENOUS
  Filled 2023-12-30 (×9): qty 10

## 2023-12-30 MED ORDER — ENSURE ENLIVE PO LIQD
237.0000 mL | Freq: Two times a day (BID) | ORAL | Status: DC
  Administered 2023-12-30 – 2024-01-02 (×6): 237 mL via ORAL

## 2023-12-30 NOTE — Discharge Instructions (Signed)
Information on my medicine - ELIQUIS (apixaban)   Why was Eliquis prescribed for you? Eliquis was prescribed to treat blood clots that may have been found in the veins of your legs (deep vein thrombosis) or in your lungs (pulmonary embolism) and to reduce the risk of them occurring again.  What do You need to know about Eliquis ? The starting dose is 10 mg (two 5 mg tablets) taken TWICE daily for the FIRST SEVEN (7) DAYS, then on 01/05/24 the dose is reduced to ONE 5 mg tablet taken TWICE daily.  Eliquis may be taken with or without food.   Try to take the dose about the same time in the morning and in the evening. If you have difficulty swallowing the tablet whole please discuss with your pharmacist how to take the medication safely.  Take Eliquis exactly as prescribed and DO NOT stop taking Eliquis without talking to the doctor who prescribed the medication.  Stopping may increase your risk of developing a new blood clot.  Refill your prescription before you run out.  After discharge, you should have regular check-up appointments with your healthcare provider that is prescribing your Eliquis.    What do you do if you miss a dose? If a dose of ELIQUIS is not taken at the scheduled time, take it as soon as possible on the same day and twice-daily administration should be resumed. The dose should not be doubled to make up for a missed dose.  Important Safety Information A possible side effect of Eliquis is bleeding. You should call your healthcare provider right away if you experience any of the following: Bleeding from an injury or your nose that does not stop. Unusual colored urine (red or dark brown) or unusual colored stools (red or black). Unusual bruising for unknown reasons. A serious fall or if you hit your head (even if there is no bleeding).  Some medicines may interact with Eliquis and might increase your risk of bleeding or clotting while on Eliquis. To help avoid this,  consult your healthcare provider or pharmacist prior to using any new prescription or non-prescription medications, including herbals, vitamins, non-steroidal anti-inflammatory drugs (NSAIDs) and supplements.  This website has more information on Eliquis (apixaban): [email or web address]://[email or web address]

## 2023-12-30 NOTE — Evaluation (Signed)
Occupational Therapy Evaluation Patient Details Name: [name] MRN: [id number] DOB: 12/26/1940 Today's Date: 12/30/2023   History of Present Illness Pt is an 83 y.o. female who presents 2/1 with extensive right lower extremity DVT  and Pulmonary embolism. Recent admission and d/c s/p OR debridement 12/16/2023. PMH significant for dementia, heart murmur, HTN, osteoporosis, ORIF ankle fx, wrist surgery.   Clinical Impression   Per chart, patient is from home with family with significant dementia. Patient has been bedridden recently after I&D of sacral wound. Currently, patient requiring mod A for bed mobility, and mod A of 2 to come into standing. Minimal command following noted, but no engagement in purposeful tasks. OT recommending rehab at lesser intensive facility < 3 hours, however noting meeting with palliative per chart. OT will defer to family decisions to promote best quality of life.       If plan is discharge home, recommend the following: Two people to help with walking and/or transfers;Two people to help with bathing/dressing/bathroom;Assist for transportation;Help with stairs or ramp for entrance;Supervision due to cognitive status;Assistance with cooking/housework;Assistance with feeding;Direct supervision/assist for medications management;Direct supervision/assist for financial management    Functional Status Assessment  Patient has had a recent decline in their functional status and demonstrates the ability to make significant improvements in function in a reasonable and predictable amount of time.  Equipment Recommendations  Other (comment) (defer to next venue)    Recommendations for Other Services       Precautions / Restrictions Precautions Precautions: Fall Restrictions Weight Bearing Restrictions Per Provider Order: No      Mobility Bed Mobility Overal bed mobility: Needs Assistance Bed Mobility: Sidelying to Sit, Sit to Sidelying   Sidelying to sit: Mod  assist, +2 for physical assistance, HOB elevated, Used rails     Sit to sidelying: Max assist, +2 for physical assistance, +2 for safety/equipment General bed mobility comments: Mod assist +2 to roll and rise from EOB, pt facilitates by reaching for therapist hands, needs guidance of LEs off bed and trunk support to rise. max A +2 to lower trunk and lift LEs into bed. Rolled to reduce friction on buttocks    Transfers Overall transfer level: Needs assistance Equipment used: 2 person hand held assist, 1 person hand held assist Transfers: Sit to/from Stand, Bed to chair/wheelchair/BSC Sit to Stand: +2 physical assistance, Mod assist     Step pivot transfers: Mod assist, +2 safety/equipment, +2 physical assistance     General transfer comment: Mod assist +2 to stand initially. Progresed to mod assit +1 with knee block, but able to remain standing with mod assist, knees unblocked, demonstrates posterior lean. Progressed with lateral steps along bed Mod A +2 for weight shift, balance and to facilitate steps; max VC.      Balance Overall balance assessment: Needs assistance Sitting-balance support: Feet supported, No upper extremity supported Sitting balance-Leahy Scale: Fair Sitting balance - Comments: Initially min assist. progressed to CGA.   Standing balance support: Bilateral upper extremity supported, During functional activity Standing balance-Leahy Scale: Poor                             ADL either performed or assessed with clinical judgement   ADL Overall ADL's : Needs assistance/impaired Eating/Feeding: Total assistance;Bed level   Grooming: Total assistance;Bed level   Upper Body Bathing: Total assistance;Bed level   Lower Body Bathing: Total assistance;Sit to/from stand;Sitting/lateral leans   Upper Body Dressing : Total  assistance;Sitting   Lower Body Dressing: Total assistance;+2 for physical assistance;+2 for safety/equipment;Sit to/from  stand;Sitting/lateral leans   Toilet Transfer: Moderate assistance;+2 for physical assistance;+2 for safety/equipment Toilet Transfer Details (indicate cue type and reason): step pivot from EOB Toileting- Clothing Manipulation and Hygiene: Total assistance Toileting - Clothing Manipulation Details (indicate cue type and reason): bed level, noted black tarry stool     Functional mobility during ADLs: Maximal assistance;+2 for physical assistance;+2 for safety/equipment;Cueing for safety;Cueing for sequencing General ADL Comments: Per chart, patient is from home with family with significant dementia. Patient has been bedridden recently after I&D of sacral wound. Currently, patient requiring mod A for bed mobility, and mod A of 2 to come into standing. Minimal command following noted, but no engagement in purposeful tasks. OT recommending rehab at lesser intensive facility < 3 hours, however noting meeting with palliative per chart. OT will defer to family decisions to promote best quality of life.     Vision         Perception Perception: Not tested       Praxis Praxis: Not tested       Pertinent Vitals/Pain Pain Assessment Pain Assessment: PAINAD Breathing: normal Negative Vocalization: none Facial Expression: smiling or inexpressive Body Language: tense, distressed pacing, fidgeting Consolability: distracted or reassured by voice/touch PAINAD Score: 2 Pain Location: sacral wound Pain Descriptors / Indicators: Grimacing Pain Intervention(s): Limited activity within patient's tolerance, Monitored during session, Repositioned     Extremity/Trunk Assessment Upper Extremity Assessment Upper Extremity Assessment: Generalized weakness   Lower Extremity Assessment Lower Extremity Assessment: Defer to PT evaluation   Cervical / Trunk Assessment Cervical / Trunk Assessment: Kyphotic (Forward head posture with rounded shoulders)   Communication Communication Communication: No  apparent difficulties   Cognition Arousal: Alert Behavior During Therapy: Anxious, Flat affect Overall Cognitive Status: No family/caregiver present to determine baseline cognitive functioning                                       General Comments  dark tarry stool, MD notified    Exercises     Shoulder Instructions      Home Living Family/patient expects to be discharged to:: Private residence Living Arrangements: Children                               Additional Comments: Pt is a poor historian due to dementia. Unable to gain any meaningful information about home set up or PLOF      Prior Functioning/Environment Prior Level of Function : Patient poor historian/Family not available                        OT Problem List: Decreased strength;Decreased activity tolerance;Impaired balance (sitting and/or standing);Decreased coordination;Decreased safety awareness;Increased edema      OT Treatment/Interventions: Self-care/ADL training;Therapeutic exercise;Energy conservation;DME and/or AE instruction;Manual therapy;Therapeutic activities;Patient/family education;Balance training    OT Goals(Current goals can be found in the care plan section) Acute Rehab OT Goals Patient Stated Goal: unable OT Goal Formulation: Patient unable to participate in goal setting Time For Goal Achievement: 01/13/24 Potential to Achieve Goals: Fair ADL Goals Pt Will Perform Lower Body Bathing: with mod assist;with caregiver independent in assisting;sitting/lateral leans;sit to/from stand Pt Will Perform Lower Body Dressing: with mod assist;sit to/from stand;sitting/lateral leans;with caregiver independent in assisting Pt Will Transfer to  Toilet: with mod assist;stand pivot transfer;bedside commode Pt Will Perform Toileting - Clothing Manipulation and hygiene: with mod assist;sitting/lateral leans;sit to/from stand Additional ADL Goal #1: Patient will complete bed  mobility at mod A as a precursor to OOB activities.  OT Frequency: Min 1X/week    Co-evaluation   Reason for Co-Treatment: Complexity of the patient's impairments (multi-system involvement);Necessary to address cognition/behavior during functional activity;For patient/therapist safety;To address functional/ADL transfers PT goals addressed during session: Mobility/safety with mobility;Balance OT goals addressed during session: ADL's and self-care;Strengthening/ROM      AM-PAC OT "6 Clicks" Daily Activity     Outcome Measure Help from another person eating meals?: Total Help from another person taking care of personal grooming?: Total Help from another person toileting, which includes using toliet, bedpan, or urinal?: Total Help from another person bathing (including washing, rinsing, drying)?: Total Help from another person to put on and taking off regular upper body clothing?: Total Help from another person to put on and taking off regular lower body clothing?: Total 6 Click Score: 6   End of Session Nurse Communication: Mobility status  Activity Tolerance: Patient limited by lethargy;Patient limited by fatigue Patient left: in bed;with call bell/phone within reach;with bed alarm set  OT Visit Diagnosis: Unsteadiness on feet (R26.81);Muscle weakness (generalized) (M62.81)                Time: 1010-1026 OT Time Calculation (min): 16 min Charges:  OT General Charges $OT Visit: 1 Visit OT Evaluation $OT Eval Moderate Complexity: 1 Mod  Pollyann Glen E. Mishayla Sliwinski, OTR/L Acute Rehabilitation Services [phone]   Cherlyn Cushing 12/30/2023, 11:39 AM

## 2023-12-30 NOTE — Progress Notes (Addendum)
Progress Note   Patient: [name] ZOX:[id number] DOB: 11/13/1941 DOA: 12/28/2023     2 DOS: the patient was seen and examined on 12/30/2023 at 4:30PM      Brief hospital course: 83 y.o. F with advanced dementia, lives at home, speech limited to short phrases/nonsensical and mostly nonambulatory at baseline, recent admission for infected sacral decubitus ulcer requiring surgical debridement who presented with leg swelling.  Found to have extensive DVT to the iliacs and IVC as well as small PE.  Vascular surgery consulted and admitted on heparin.      Assessment and Plan:  Extensive right lower extremity DVT Pulmonary embolism, acute Discussed with vascular surgery, despite the extensive nature, her swelling does not appear to be particular symptomatic at present, and with her limited ambulatory status, advanced dementia, risks of intervention outweigh possible benefits, and vascular surgery recommended against thrombectomy. -Transition to Eliquis, now on hold due to GI bleed. -- HB dropped by 2 grams. -Mobility as tolerated -GI has been consulted based on their evaluation will determine if patient is appropriate enough to continue anticoagulation if not then we will have to consider doing an IVC filter.   Stage IV decubitus ulcer, present on admission Possible mild wound infection There was some concern for infection on presentation, but procalcitonin negative, there is no purulence in the pictures yesterday, nor is there any purulence today.  No surrounding erythema, induration, or drainage.  CT showed no evidence of osteomyelitis, given her dementia, she is unfortunately unable to sit still enough for MRI.  -Consult wound care - Daily wet-to-dry dressings - Transition antibiotics to oral for 5 day course on Augmentin, DM WBC count is increased to 24,000 will check procalcitonin.  If elevated will switch to IV antibiotic.  Black tarry stool/GI bleed: - Patient was started on  Eliquis last night for acute DVT treatment - Had a black tarry stool this morning, hemoglobin dropped by 2 g - Hold Eliquis and check hemoglobin every 8 hours - GI consulted. -Started the patient on Protonix  Dementia - Continue Seroquel -Resume home Vesicare and mirtazapine  Hypertension Blood pressure normal, not on medication at home  Hypercholesterolemia -Continue Lipitor  Positive blood culture Coag negative staph in 1 of 2 blood cultures, suspect this is contaminant. - Follow second blood culture, no growth in 2 days       Subjective: The patient is nonverbal.  Patient had a back tarry stool this morning.  Hemoglobin dropped by 2 g.  White count is also worsened to 24,000, checking procalcitonin.  For now continue oral antibiotic.     Physical Exam: BP 128/74 (BP Location: Right Arm)   Pulse 89   Temp 97.6 F (36.4 C)   Resp 17   Ht 5\' 2"  (1.575 m)   Wt 65.3 kg   LMP  (LMP Unknown)   SpO2 100%   BMI 26.33 kg/m   Older adult female, lying in bed, somnolent, opens eyes when touched, but does not cooperate with exam otherwise Tachycardic, regular, no murmurs, no peripheral edema Respiratory rate normal, lungs clear without rales or wheezes Abdomen soft no tenderness palpation or guarding, no ascites or distention Opens eyes when manipulated, then closes them, she makes no spontaneous verbalizations, but she protests when moved, she is not oriented to self, situation, does not recognize son, moves upper extremities with normal strength and coordination, lower extremities not tested due to poor cooperation There is a sacral ulcer as pictured below, this has some fibrinous  material at the base, but no purulence, no surrounding erythema or induration    Data Reviewed: Discussed with vascular surgery Heparin level elevated LFTs slightly elevated Electrolytes and renal function normal Troponin and CK normal Procalcitonin undetectable CRP slightly elevated White  blood cell count 16 Blood cultures with Staph epidermidis in 1 of 2 CT abdomen and pelvis reviewed, shows extensive DVT up to the IVC to the level of the renal veins, as well as small acute PE, as well as cutaneous thickening around the gluteal cleft, no bony destructive findings    Family Communication: Son at the bedside    Disposition: Status is: Inpatient Patient was admitted with DVT, small PE, as well as concern for infection of her wound  It is my impression that the wound is actually not very infected, and that her main concern is the DVT.  Vascular were consulted, and recommended no intervention.  Family actually have a meeting with our care to discuss hospice care tomorrow, and so my recommendation is that we transition to Eliquis, make sure that she has all equipment she needs at home, and if she is able to tolerate diet tomorrow morning, discharged with a few more days of oral antibiotics, and follow-up with hospice        Author: Harold Hedge, MD 12/30/2023 12:08 PM  For on call review [email or web address].

## 2023-12-30 NOTE — Plan of Care (Signed)
   Problem: Education: Goal: Knowledge of General Education information will improve Description: Including pain rating scale, medication(s)/side effects and non-pharmacologic comfort measures Outcome: Progressing   Problem: Health Behavior/Discharge Planning: Goal: Ability to manage health-related needs will improve Outcome: Progressing   Problem: Clinical Measurements: Goal: Will remain free from infection Outcome: Progressing

## 2023-12-30 NOTE — Progress Notes (Signed)
Initial Nutrition Assessment  DOCUMENTATION CODES:   Not applicable  INTERVENTION:  Continue with regular diet Ensure Plus High Protein po BID, each supplement provides 350 kcal and 20 grams of protein. Multivitamin/minerals   NUTRITION DIAGNOSIS:   Increased nutrient needs related to wound healing as evidenced by estimated needs.    GOAL:   Patient will meet greater than or equal to 90% of their needs    MONITOR:   PO intake  REASON FOR ASSESSMENT:   Consult Assessment of nutrition requirement/status, Wound healing  ASSESSMENT:  83 y.o. F presented to ED from home with reports of right leg swelling along with worsening of her decubitus ulcer. Found to have DVT right lower extremities. PMH:  HTN, HLD, Dementia, Decubitus ulcer. Patient pleasantly confused.  Was unable to obtain nutrition history on this day. No family at bed side. 50% of breakfast documented. Suspect increased nutrient needs.  Will implement Multi vitamin and ensure bid. To help meet increased needs.  Admit weight: 66.7 kg Weight history: 12/30/23 65.3 kg  12/16/23 66.7 kg  11/26/23 68 kg  09/13/22 68 kg      Average Meal Intake: No current documentation.   Nutritionally Relevant Medications: Scheduled Meds:  fesoterodine  4 mg Oral Daily   memantine  10 mg Oral BID   QUEtiapine  25 mg Oral Q0600   PRN Meds:.acetaminophen **OR** acetaminophen, HYDROcodone-acetaminophen, mirtazapine, morphine injection, ondansetron **OR** ondansetron (ZOFRAN) IV  Labs Reviewed reviewed    NUTRITION - FOCUSED PHYSICAL EXAM:  Flowsheet Row Most Recent Value  Orbital Region Mild depletion  Upper Arm Region Mild depletion  Thoracic and Lumbar Region No depletion  Buccal Region No depletion  Temple Region Mild depletion  Clavicle Bone Region Mild depletion  Clavicle and Acromion Bone Region Mild depletion  Scapular Bone Region Unable to assess  Dorsal Hand Unable to assess  Patellar Region No  depletion  Anterior Thigh Region No depletion  Posterior Calf Region No depletion  Edema (RD Assessment) None  Hair Reviewed  Eyes Reviewed  Mouth Reviewed  Skin Reviewed  Nails Reviewed       Diet Order:   Diet Order             Diet regular Fluid consistency: Thin  Diet effective now                   EDUCATION NEEDS:   Not appropriate for education at this time  Skin:  Skin Assessment: Skin Integrity Issues: Skin Integrity Issues:: Stage III Stage III: sacrum, 5.5 x 6 x 1(Per WOC noted)  Last BM:  12/30/23 (type 5 and 6)  Height:   Ht Readings from Last 1 Encounters:  12/28/23 5\' 2"  (1.575 m)    Weight:   Wt Readings from Last 1 Encounters:  12/30/23 65.3 kg    Ideal Body Weight:     BMI:  Body mass index is 26.33 kg/m.  Estimated Nutritional Needs:   Kcal:  2000- 2300 kcal  Protein:  85-105 g  Fluid:  25ml/kcal    Jamelle Haring RDN, LDN Clinical Dietitian   If unable to reach, please contact "RD Inpatient" secure chat group between 8 am-4 pm daily"

## 2023-12-30 NOTE — Consult Note (Signed)
Reason for Consult: Melena Referring Physician: Hospital team  [name] is an 83 y.o. female.  HPI: Patient is seen and examined and her hospital computer chart reviewed in our office computer chart reviewed and her last colonoscopy was 2018 without any findings and her case discussed with her son who is power of attorney and she was admitted with DVT PE and her heparin level was too high and she did have 1 bout of melena and we are consulted for further workup and plans she is on an aspirin a day and is demented and does have a significant decubiti but no other specific GI complaints according to her son Past Medical History:  Diagnosis Date   Heart murmur    Hyperlipidemia    Hypertension    Osteoporosis    Seasonal allergies     Past Surgical History:  Procedure Laterality Date   CATARACT EXTRACTION     FRACTURE SURGERY     INCISION AND DRAINAGE OF WOUND Left 12/16/2023   Procedure: EXCISIONAL DEBRIDMENT DECUBITUS ULCER;  Surgeon: [name], MD;  Location: [facility] OR;  Service: General;  Laterality: Left;   ORIF ANKLE FRACTURE     TUBAL LIGATION     WRIST SURGERY      Family History  Problem Relation Age of Onset   Diabetes Mother    CAD Father    Diabetes Sister    Diabetes Brother    Diabetes Sister    Diabetes Sister    Cancer - Colon Brother    Cancer Brother    Alzheimer's disease Brother     Social History:  reports that she has never smoked. She has never used smokeless tobacco. She reports that she does not drink alcohol and does not use drugs.  Allergies:  Allergies  Allergen Reactions   Aspirin Nausea And Vomiting    High doses    Medications: I have reviewed the patient's current medications.  Results for orders placed or performed during the hospital encounter of 12/28/23 (from the past 48 hours)  CBC with Differential     Status: Abnormal   Collection Time: 12/28/23  3:49 PM  Result Value Ref Range   WBC 16.4 (H) 4.0 - 10.5 K/uL   RBC 4.53 3.87  - 5.11 MIL/uL   Hemoglobin 12.6 12.0 - 15.0 g/dL   HCT 29.5 28.4 - 13.2 %   MCV 87.2 80.0 - 100.0 fL   MCH 27.8 26.0 - 34.0 pg   MCHC 31.9 30.0 - 36.0 g/dL   RDW 44.0 (H) 10.2 - 72.5 %   Platelets 209 150 - 400 K/uL   nRBC 0.4 (H) 0.0 - 0.2 %   Neutrophils Relative % 84 %   Neutro Abs 13.6 (H) 1.7 - 7.7 K/uL   Lymphocytes Relative 8 %   Lymphs Abs 1.3 0.7 - 4.0 K/uL   Monocytes Relative 7 %   Monocytes Absolute 1.1 (H) 0.1 - 1.0 K/uL   Eosinophils Relative 0 %   Eosinophils Absolute 0.0 0.0 - 0.5 K/uL   Basophils Relative 0 %   Basophils Absolute 0.1 0.0 - 0.1 K/uL   Immature Granulocytes 1 %   Abs Immature Granulocytes 0.20 (H) 0.00 - 0.07 K/uL    Comment: Performed at [facility], [address], [place], [place] [address]  Basic metabolic panel     Status: Abnormal   Collection Time: 12/28/23  3:49 PM  Result Value Ref Range   Sodium 143 135 - 145  mmol/L   Potassium 3.6 3.5 - 5.1 mmol/L   Chloride 105 98 - 111 mmol/L   CO2 23 22 - 32 mmol/L   Glucose, Bld 107 (H) 70 - 99 mg/dL    Comment: Glucose reference range applies only to samples taken after fasting for at least 8 hours.   BUN 15 8 - 23 mg/dL   Creatinine, Ser 1.61 0.44 - 1.00 mg/dL   Calcium 9.2 8.9 - 09.6 mg/dL   GFR, Estimated >04 >54 mL/min    Comment: (NOTE) Calculated using the CKD-EPI Creatinine Equation (2021)    Anion gap 15 5 - 15    Comment: Performed at [facility], [address] [place], [place] [address]  Sedimentation rate     Status: None   Collection Time: 12/28/23  3:49 PM  Result Value Ref Range   Sed Rate 19 0 - 22 mm/hr    Comment: Performed at [facility], [address] [place], [place] [address]  C-reactive protein     Status: Abnormal   Collection Time: 12/28/23  3:49 PM  Result Value Ref Range   CRP 7.5 (H) <1.0 mg/dL    Comment: Performed at [facility], [address] [place], [place] [address]  Procalcitonin     Status: None   Collection Time:  12/28/23  3:49 PM  Result Value Ref Range   Procalcitonin <0.10 ng/mL    Comment:        Interpretation: PCT (Procalcitonin) <= 0.5 ng/mL: Systemic infection (sepsis) is not likely. Local bacterial infection is possible. (NOTE)       Sepsis PCT Algorithm           Lower Respiratory Tract                                      Infection PCT Algorithm    ----------------------------     ----------------------------         PCT < 0.25 ng/mL                PCT < 0.10 ng/mL          Strongly encourage             Strongly discourage   discontinuation of antibiotics    initiation of antibiotics    ----------------------------     -----------------------------       PCT 0.25 - 0.50 ng/mL            PCT 0.10 - 0.25 ng/mL               OR       >80% decrease in PCT            Discourage initiation of                                            antibiotics      Encourage discontinuation           of antibiotics    ----------------------------     -----------------------------         PCT >= 0.50 ng/mL              PCT 0.26 - 0.50 ng/mL  AND        <80% decrease in PCT             Encourage initiation of                                             antibiotics       Encourage continuation           of antibiotics    ----------------------------     -----------------------------        PCT >= 0.50 ng/mL                  PCT > 0.50 ng/mL               AND         increase in PCT                  Strongly encourage                                      initiation of antibiotics    Strongly encourage escalation           of antibiotics                                     -----------------------------                                           PCT <= 0.25 ng/mL                                                 OR                                        > 80% decrease in PCT                                      Discontinue / Do not initiate                                              antibiotics  Performed at [facility], [address], [place], [place] [address]   CK     Status: None   Collection Time: 12/28/23  3:49 PM  Result Value Ref Range   Total CK 204 38 - 234 U/L    Comment: Performed at [facility], [address], [place], [place] [address]  Magnesium     Status: Abnormal   Collection Time: 12/28/23  3:49 PM  Result Value Ref Range   Magnesium 2.5 (H) 1.7 - 2.4 mg/dL    Comment: Performed at [facility], [address], [place], [place] [address]  Phosphorus     Status: None   Collection Time: 12/28/23  3:49 PM  Result Value Ref Range   Phosphorus 3.5 2.5 - 4.6 mg/dL    Comment: Performed at [facility], [address] [place], [place] [address]  Hepatic function panel     Status: Abnormal   Collection Time: 12/28/23  3:49 PM  Result Value Ref Range   Total Protein 6.6 6.5 - 8.1 g/dL   Albumin 2.8 (L) 3.5 - 5.0 g/dL   AST 45 (H) 15 - 41 U/L   ALT 60 (H) 0 - 44 U/L   Alkaline Phosphatase 62 38 - 126 U/L   Total Bilirubin 1.0 0.0 - 1.2 mg/dL   Bilirubin, Direct 0.2 0.0 - 0.2 mg/dL   Indirect Bilirubin 0.8 0.3 - 0.9 mg/dL    Comment: Performed at [facility] Lab, [address], [place], [place] [address]  TSH     Status: None   Collection Time: 12/28/23  3:49 PM  Result Value Ref Range   TSH 1.599 0.350 - 4.500 uIU/mL    Comment: Performed by a 3rd Generation assay with a functional sensitivity of <=0.01 uIU/mL. Performed at [facility], [address] [place], [place] [address]   Culture, blood (routine x 2)     Status: Abnormal   Collection Time: 12/28/23  3:52 PM   Specimen: BLOOD RIGHT ARM  Result Value Ref Range   Specimen Description BLOOD RIGHT ARM    Special Requests      BOTTLES DRAWN AEROBIC AND ANAEROBIC Blood Culture results may not be optimal due to an inadequate volume of blood received in culture bottles   Culture  Setup Time      GRAM POSITIVE COCCI IN CLUSTERS AEROBIC BOTTLE  ONLY CRITICAL RESULT CALLED TO, READ BACK BY AND VERIFIED WITH: PHARMD TONY RUDISILL [id number] AT 1536 BY EC    Culture (A)     STAPHYLOCOCCUS EPIDERMIDIS THE SIGNIFICANCE OF ISOLATING THIS ORGANISM FROM A SINGLE SET OF BLOOD CULTURES WHEN MULTIPLE SETS ARE DRAWN IS UNCERTAIN. PLEASE NOTIFY THE MICROBIOLOGY DEPARTMENT WITHIN ONE WEEK IF SPECIATION AND SENSITIVITIES ARE REQUIRED. Performed at [facility], [address] [place], [place] [address]    Report Status 12/30/2023 FINAL   Blood Culture ID Panel (Reflexed)     Status: Abnormal   Collection Time: 12/28/23  3:52 PM  Result Value Ref Range   Enterococcus faecalis NOT DETECTED NOT DETECTED   Enterococcus Faecium NOT DETECTED NOT DETECTED   Listeria monocytogenes NOT DETECTED NOT DETECTED   Staphylococcus species DETECTED (A) NOT DETECTED    Comment: CRITICAL RESULT CALLED TO, READ BACK BY AND VERIFIED WITH: PHARMD TONY RUDISILL [id number] AT 1536 BY EC    Staphylococcus aureus (BCID) NOT DETECTED NOT DETECTED   Staphylococcus epidermidis DETECTED (A) NOT DETECTED    Comment: Methicillin (oxacillin) resistant coagulase negative staphylococcus. Possible blood culture contaminant (unless isolated from more than one blood culture draw or clinical case suggests pathogenicity). No antibiotic treatment is indicated for blood  culture contaminants. CRITICAL RESULT CALLED TO, READ BACK BY AND VERIFIED WITH: PHARMD TONY RUDISILL [id number] AT 1536 BY EC    Staphylococcus lugdunensis NOT DETECTED NOT DETECTED   Streptococcus species NOT DETECTED NOT DETECTED   Streptococcus agalactiae NOT DETECTED NOT DETECTED   Streptococcus pneumoniae NOT DETECTED NOT DETECTED   Streptococcus pyogenes NOT DETECTED NOT DETECTED   A.calcoaceticus-baumannii NOT  DETECTED NOT DETECTED   Bacteroides fragilis NOT DETECTED NOT DETECTED   Enterobacterales NOT DETECTED NOT DETECTED   Enterobacter cloacae complex NOT DETECTED NOT DETECTED   Escherichia coli NOT  DETECTED NOT DETECTED   Klebsiella aerogenes NOT DETECTED NOT DETECTED   Klebsiella oxytoca NOT DETECTED NOT DETECTED   Klebsiella pneumoniae NOT DETECTED NOT DETECTED   Proteus species NOT DETECTED NOT DETECTED   Salmonella species NOT DETECTED NOT DETECTED   Serratia marcescens NOT DETECTED NOT DETECTED   Haemophilus influenzae NOT DETECTED NOT DETECTED   Neisseria meningitidis NOT DETECTED NOT DETECTED   Pseudomonas aeruginosa NOT DETECTED NOT DETECTED   Stenotrophomonas maltophilia NOT DETECTED NOT DETECTED   Candida albicans NOT DETECTED NOT DETECTED   Candida auris NOT DETECTED NOT DETECTED   Candida glabrata NOT DETECTED NOT DETECTED   Candida krusei NOT DETECTED NOT DETECTED   Candida parapsilosis NOT DETECTED NOT DETECTED   Candida tropicalis NOT DETECTED NOT DETECTED   Cryptococcus neoformans/gattii NOT DETECTED NOT DETECTED   Methicillin resistance mecA/C DETECTED (A) NOT DETECTED    Comment: CRITICAL RESULT CALLED TO, READ BACK BY AND VERIFIED WITH: PHARMD TONY RUDISILL [id number] AT 1536 BY EC Performed at [facility], [address] [place], [place] [address]   I-Stat Lactic Acid     Status: None   Collection Time: 12/28/23  3:59 PM  Result Value Ref Range   Lactic Acid, Venous 1.9 0.5 - 1.9 mmol/L  Troponin I (High Sensitivity)     Status: None   Collection Time: 12/28/23  8:17 PM  Result Value Ref Range   Troponin I (High Sensitivity) 15 <18 ng/L    Comment: (NOTE) Elevated high sensitivity troponin I (hsTnI) values and significant  changes across serial measurements may suggest ACS but many other  chronic and acute conditions are known to elevate hsTnI results.  Refer to the "Links" section for chest pain algorithms and additional  guidance. Performed at [facility], [address] [place], [place] [address]   I-Stat Lactic Acid     Status: None   Collection Time: 12/28/23  8:57 PM  Result Value Ref Range   Lactic Acid, Venous 0.9 0.5 - 1.9  mmol/L  Troponin I (High Sensitivity)     Status: None   Collection Time: 12/29/23 12:50 AM  Result Value Ref Range   Troponin I (High Sensitivity) 15 <18 ng/L    Comment: (NOTE) Elevated high sensitivity troponin I (hsTnI) values and significant  changes across serial measurements may suggest ACS but many other  chronic and acute conditions are known to elevate hsTnI results.  Refer to the "Links" section for chest pain algorithms and additional  guidance. Performed at [facility], [address], [place], [place] [address]   Heparin level (unfractionated)     Status: Abnormal   Collection Time: 12/29/23  3:33 AM  Result Value Ref Range   Heparin Unfractionated >1.10 (H) 0.30 - 0.70 IU/mL    Comment: (NOTE) The clinical reportable range upper limit is being lowered to >1.10 to align with the FDA approved guidance for the current laboratory assay.  If heparin results are below expected values, and patient dosage has  been confirmed, suggest follow up testing of antithrombin III levels. Performed at [facility], [address] [place], [place] [address]   Prealbumin     Status: Abnormal   Collection Time: 12/29/23  3:33 AM  Result Value Ref Range   Prealbumin 12 (  L) 18 - 38 mg/dL    Comment: Performed at [facility], [address], [place], [place] [address]  Heparin level (unfractionated)     Status: Abnormal   Collection Time: 12/29/23  2:49 PM  Result Value Ref Range   Heparin Unfractionated >1.10 (H) 0.30 - 0.70 IU/mL    Comment: (NOTE) The clinical reportable range upper limit is being lowered to >1.10 to align with the FDA approved guidance for the current laboratory assay.  If heparin results are below expected values, and patient dosage has  been confirmed, suggest follow up testing of antithrombin III levels. Performed at [facility], [address], [place], [place] [address]   CBC     Status: Abnormal   Collection Time: 12/30/23   6:32 AM  Result Value Ref Range   WBC 24.1 (H) 4.0 - 10.5 K/uL   RBC 3.87 3.87 - 5.11 MIL/uL   Hemoglobin 10.7 (L) 12.0 - 15.0 g/dL   HCT 30.8 (L) 65.7 - 84.6 %   MCV 86.6 80.0 - 100.0 fL   MCH 27.6 26.0 - 34.0 pg   MCHC 31.9 30.0 - 36.0 g/dL   RDW 96.2 (H) 95.2 - 84.1 %   Platelets 267 150 - 400 K/uL   nRBC 0.8 (H) 0.0 - 0.2 %    Comment: Performed at [facility], [address], [place], [place] [address]  Comprehensive metabolic panel     Status: Abnormal   Collection Time: 12/30/23  6:32 AM  Result Value Ref Range   Sodium 145 135 - 145 mmol/L   Potassium 4.1 3.5 - 5.1 mmol/L   Chloride 113 (H) 98 - 111 mmol/L   CO2 21 (L) 22 - 32 mmol/L   Glucose, Bld 178 (H) 70 - 99 mg/dL    Comment: Glucose reference range applies only to samples taken after fasting for at least 8 hours.   BUN 37 (H) 8 - 23 mg/dL   Creatinine, Ser 1.02 0.44 - 1.00 mg/dL   Calcium 8.7 (L) 8.9 - 10.3 mg/dL   Total Protein 5.7 (L) 6.5 - 8.1 g/dL   Albumin 2.5 (L) 3.5 - 5.0 g/dL   AST 29 15 - 41 U/L   ALT 39 0 - 44 U/L   Alkaline Phosphatase 54 38 - 126 U/L   Total Bilirubin 0.6 0.0 - 1.2 mg/dL   GFR, Estimated 59 (L) >60 mL/min    Comment: (NOTE) Calculated using the CKD-EPI Creatinine Equation (2021)    Anion gap 11 5 - 15    Comment: Performed at [facility], [address] [place], [place] [address]  Hemoglobin and hematocrit, blood     Status: Abnormal   Collection Time: 12/30/23  1:24 PM  Result Value Ref Range   Hemoglobin 10.1 (L) 12.0 - 15.0 g/dL   HCT 64.4 (L) 03.4 - 74.2 %    Comment: Performed at [facility] Lab, [address] [place], [place] [address]    VAS [place] LOWER EXTREMITY VENOUS (DVT) (7a-7p) Result Date: 12/29/2023  Lower Venous DVT Study Patient Name:  KAYLENN CIVIL [facility]  Date of Exam:   12/28/2023 Medical Rec #: [id number]       Accession #:    [id number] Date of Birth: 05-Jun-1941       Patient Gender: F Patient Age:   2 years Exam Location:  [facility]  Procedure:      VAS [place] LOWER EXTREMITY VENOUS (DVT)  Referring Phys: Evlyn Kanner --------------------------------------------------------------------------------  Indications: Swelling, Edema, and Post-op.  Limitations: Combative patient, limited exam. unable to image the contralateral limb. Comparison Study: No prior exam. Performing Technologist: Fernande Bras  Examination Guidelines: A complete evaluation includes B-mode imaging, spectral Doppler, color Doppler, and power Doppler as needed of all accessible portions of each vessel. Bilateral testing is considered an integral part of a complete examination. Limited examinations for reoccurring indications may be performed as noted. The reflux portion of the exam is performed with the patient in reverse Trendelenburg.  +---------+---------------+---------+-----------+----------+-------------------+ RIGHT    CompressibilityPhasicitySpontaneityPropertiesThrombus Aging      +---------+---------------+---------+-----------+----------+-------------------+ CFV      None           No       No                                       +---------+---------------+---------+-----------+----------+-------------------+ SFJ      None                                                             +---------+---------------+---------+-----------+----------+-------------------+ FV Prox  None           No       No                                       +---------+---------------+---------+-----------+----------+-------------------+ FV Mid   None           No       No                                       +---------+---------------+---------+-----------+----------+-------------------+ FV DistalNone           No       No                                       +---------+---------------+---------+-----------+----------+-------------------+ PFV      None           No       No                                        +---------+---------------+---------+-----------+----------+-------------------+ POP      None           No       No                                       +---------+---------------+---------+-----------+----------+-------------------+ PTV  Not well visualized +---------+---------------+---------+-----------+----------+-------------------+ PERO     Partial        No       No                                       +---------+---------------+---------+-----------+----------+-------------------+ GSV      None           Yes      Yes                                      +---------+---------------+---------+-----------+----------+-------------------+   +----+---------------+---------+-----------+----------+--------------+ LEFTCompressibilityPhasicitySpontaneityPropertiesThrombus Aging +----+---------------+---------+-----------+----------+--------------+ CFV Full                                                        +----+---------------+---------+-----------+----------+--------------+    Summary: RIGHT: - Findings consistent with age indeterminate deep vein thrombosis involving the right common femoral vein, SF junction, right femoral vein, right proximal profunda vein, right popliteal vein, and right peroneal veins.    *See table(s) above for measurements and observations. Electronically signed by Lemar Livings MD on 12/29/2023 at 3:57:56 PM.    Final    CT Angio Chest PE W and/or Wo Contrast Result Date: 12/28/2023 CLINICAL DATA:  Two weeks postoperative from right sacral ulcer repair, right lower extremity swelling and edema. Some purulence from the region of the decubitus ulcer. Dementia. EXAM: CT ANGIOGRAPHY CHEST CT ABDOMEN AND PELVIS WITH CONTRAST TECHNIQUE: Multidetector CT imaging of the chest was performed using the standard protocol during bolus administration of intravenous contrast. Multiplanar CT image reconstructions  and MIPs were obtained to evaluate the vascular anatomy. Multidetector CT imaging of the abdomen and pelvis was performed using the standard protocol during bolus administration of intravenous contrast. RADIATION DOSE REDUCTION: This exam was performed according to the departmental dose-optimization program which includes automated exposure control, adjustment of the mA and/or kV according to patient size and/or use of iterative reconstruction technique. CONTRAST:  75mL OMNIPAQUE IOHEXOL 350 MG/ML SOLN COMPARISON:  Multiple exams, including 09/13/2022 and CT pelvis from 11/27/2023 FINDINGS: CTA CHEST FINDINGS Cardiovascular: Small pulmonary embolus in the anterior segmental branch of the right upper lobe pulmonary artery, image 131 series 7. Segmental pulmonary embolus in the lingular segmental branch of the left pulmonary artery. No lobar or greater level clot, accordingly RV to left LV ratio measurement is not indicated. Coronary, aortic arch, and branch vessel atherosclerotic vascular disease. Mediastinum/Nodes: Chronic dense calcification adjacent to the distal esophagus and just above the diaphragm, not changed from 2023, probably a densely calcified lymph node. Dilated upper esophagus noted. Lungs/Pleura: Unremarkable Musculoskeletal: No acute musculoskeletal findings. Review of the MIP images confirms the above findings. CT ABDOMEN and PELVIS FINDINGS Hepatobiliary: Stable hypodense hepatic lesions, likely cysts. Gallbladder mildly obscured by motion artifact but grossly unremarkable. Pancreas: Stable mild prominence of dorsal pancreatic duct in the pancreatic head. Otherwise unremarkable. Spleen: Benign calcified granulomas in the spleen. Adrenals/Urinary Tract: No significant findings. Stomach/Bowel: Mild prominence of stool in the proximal colon. No dilated bowel observed. Normal appendix. Vascular/Lymphatic: Atherosclerosis is present, including aortoiliac atherosclerotic disease. There is extensive  thrombus in the right proximal superficial femoral vein, profundal branch, right common  femoral vein, right external iliac vein, right common iliac vein, and extending up the IVC to the level of the renal veins. Reproductive: Calcified uterine fibroids. Other: Presacral edema. Musculoskeletal: Abnormal cutaneous thickening, subcutaneous stranding, and trace amount of left paracentral gas along the gluteal cleft with density in this vicinity extending to the outer periosteal margin of the sacrum. No clear bony destructive findings to further indicate osteomyelitis, although there is some mild presacral edema and MRI would be more specific with regard to osteomyelitis. The miniscule amount of gas to the left of the sulcus on image 65 series 3 may be spontaneously draining as a cause for the reported purulent discharge. No definite non draining abscess observed. Substantial subcutaneous edema in the right upper thigh, most likely related to the DVT. Review of the MIP images confirms the above findings. IMPRESSION: 1. 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 Given the unusual heavy clot burden, catheter thrombectomy should be considered especially if the right lower extremity is symptomatic. 2. Small acute pulmonary emboli in the anterior segmental branch of the right upper lobe pulmonary artery and in the lingular segmental branch of the left pulmonary artery. No lobar or greater level clot, accordingly RV to left LV ratio measurement is not indicated. 3. Abnormal cutaneous thickening, subcutaneous stranding, and trace amount of left paracentral gas along the gluteal cleft with density in this vicinity extending to the outer periosteal margin of the sacrum. No clear bony destructive findings to further indicate osteomyelitis,  although there is some mild presacral edema and MRI would be more specific with regard to osteomyelitis. The miniscule amount of gas to the left of the sulcus may be spontaneously draining as a cause for the reported purulent discharge. No definite non draining abscess observed. 4. Substantial subcutaneous edema in the right upper thigh, most likely related to the DVT. 5. Mild prominence of stool in the proximal colon. 6. Calcified uterine fibroids. 7. Aortic atherosclerosis. Electronically Signed   By: Gaylyn Rong M.D.   On: 12/28/2023 18:11   CT ABDOMEN PELVIS W CONTRAST Result Date: 12/28/2023 CLINICAL DATA:  Two weeks postoperative from right sacral ulcer repair, right lower extremity swelling and edema. Some purulence from the region of the decubitus ulcer. Dementia. EXAM: CT ANGIOGRAPHY CHEST CT ABDOMEN AND PELVIS WITH CONTRAST TECHNIQUE: Multidetector CT imaging of the chest was performed using the standard protocol during bolus administration of intravenous contrast. Multiplanar CT image reconstructions and MIPs were obtained to evaluate the vascular anatomy. Multidetector CT imaging of the abdomen and pelvis was performed using the standard protocol during bolus administration of intravenous contrast. RADIATION DOSE REDUCTION: This exam was performed according to the departmental dose-optimization program which includes automated exposure control, adjustment of the mA and/or kV according to patient size and/or use of iterative reconstruction technique. CONTRAST:  75mL OMNIPAQUE IOHEXOL 350 MG/ML SOLN COMPARISON:  Multiple exams, including 09/13/2022 and CT pelvis from 11/27/2023 FINDINGS: CTA CHEST FINDINGS Cardiovascular: Small pulmonary embolus in the anterior segmental branch of the right upper lobe pulmonary artery, image 131 series 7. Segmental pulmonary embolus in the lingular segmental branch of the left pulmonary artery. No lobar or greater level clot, accordingly RV to left LV ratio  measurement is not indicated. Coronary, aortic arch, and branch vessel atherosclerotic vascular disease. Mediastinum/Nodes: Chronic dense calcification adjacent to the distal  esophagus and just above the diaphragm, not changed from 2023, probably a densely calcified lymph node. Dilated upper esophagus noted. Lungs/Pleura: Unremarkable Musculoskeletal: No acute musculoskeletal findings. Review of the MIP images confirms the above findings. CT ABDOMEN and PELVIS FINDINGS Hepatobiliary: Stable hypodense hepatic lesions, likely cysts. Gallbladder mildly obscured by motion artifact but grossly unremarkable. Pancreas: Stable mild prominence of dorsal pancreatic duct in the pancreatic head. Otherwise unremarkable. Spleen: Benign calcified granulomas in the spleen. Adrenals/Urinary Tract: No significant findings. Stomach/Bowel: Mild prominence of stool in the proximal colon. No dilated bowel observed. Normal appendix. Vascular/Lymphatic: Atherosclerosis is present, including aortoiliac atherosclerotic disease. There is extensive thrombus in the right proximal superficial femoral vein, profundal branch, right common femoral vein, right external iliac vein, right common iliac vein, and extending up the IVC to the level of the renal veins. Reproductive: Calcified uterine fibroids. Other: Presacral edema. Musculoskeletal: Abnormal cutaneous thickening, subcutaneous stranding, and trace amount of left paracentral gas along the gluteal cleft with density in this vicinity extending to the outer periosteal margin of the sacrum. No clear bony destructive findings to further indicate osteomyelitis, although there is some mild presacral edema and MRI would be more specific with regard to osteomyelitis. The miniscule amount of gas to the left of the sulcus on image 65 series 3 may be spontaneously draining as a cause for the reported purulent discharge. No definite non draining abscess observed. Substantial subcutaneous edema in the  right upper thigh, most likely related to the DVT. Review of the MIP images confirms the above findings. IMPRESSION: 1. 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 Given the unusual heavy clot burden, catheter thrombectomy should be considered especially if the right lower extremity is symptomatic. 2. Small acute pulmonary emboli in the anterior segmental branch of the right upper lobe pulmonary artery and in the lingular segmental branch of the left pulmonary artery. No lobar or greater level clot, accordingly RV to left LV ratio measurement is not indicated. 3. Abnormal cutaneous thickening, subcutaneous stranding, and trace amount of left paracentral gas along the gluteal cleft with density in this vicinity extending to the outer periosteal margin of the sacrum. No clear bony destructive findings to further indicate osteomyelitis, although there is some mild presacral edema and MRI would be more specific with regard to osteomyelitis. The miniscule amount of gas to the left of the sulcus may be spontaneously draining as a cause for the reported purulent discharge. No definite non draining abscess observed. 4. Substantial subcutaneous edema in the right upper thigh, most likely related to the DVT. 5. Mild prominence of stool in the proximal colon. 6. Calcified uterine fibroids. 7. Aortic atherosclerosis. Electronically Signed   By: Gaylyn Rong M.D.   On: 12/28/2023 18:11    Review of Systems negative except above she has not had an upper tract workup according to the husband and no other more recent GI tests Blood pressure 128/74, pulse 89, temperature 97.6 F (36.4 C), resp. rate 17, height 5\' 2"  (1.575 m), weight 65.3 kg, SpO2 100%. Physical Exam vital signs stable afebrile abdomen is soft nontender increased BUN  hemoglobin slight drop CT reviewed  Assessment/Plan: Melena in patient on too high of a dose of heparin multiple medical problems including DVT PE Plan: Okay with me to retry heparin at a lower dose or even low-dose  Eliquis and see how she does and continue pump inhibitor make sure you hold aspirin at home and consider a vena cava filter and will be on standby if melena continues to consider EGD procedure was discussed with the son and he would like to talk to the hospital team in more detail and please call me if any GI question or problem in the meantime otherwise we will check on tomorrow [facility] E 12/30/2023, 3:02 PM

## 2023-12-30 NOTE — Evaluation (Signed)
Physical Therapy Evaluation Patient Details Name: [name] MRN: [id number] DOB: 1941/04/06 Today's Date: 12/30/2023  History of Present Illness  Pt is an 83 y.o. female who presents 2/1 with extensive right lower extremity DVT  and Pulmonary embolism. Recent admission and d/c s/p OR debridement 12/16/2023. PMH significant for dementia, heart murmur, HTN, osteoporosis, ORIF ankle fx, wrist surgery.  Clinical Impression  Pt admitted with above diagnosis. Incontinent of dark tarry stool (MD and nurse notified via secure chat.) Required +2 Mod assist to stand and take lateral steps along bed. Up to max assist +2 to lie down safely. Limited hx available. May benefit from SNF unless family prefers to care for pt at home. She is dependent with mobility. Assisted with peri-care; repositioned in bed to relieve sacral pressure. Pt currently with functional limitations due to the deficits listed below (see PT Problem List). Pt will benefit from acute skilled PT to increase their independence and safety with mobility to allow discharge.           If plan is discharge home, recommend the following: Two people to help with walking and/or transfers;Two people to help with bathing/dressing/bathroom;Assistance with cooking/housework;Assist for transportation;Supervision due to cognitive status;Help with stairs or ramp for entrance   Can travel by private vehicle   No    Equipment Recommendations None recommended by PT  Recommendations for Other Services       Functional Status Assessment Patient has had a recent decline in their functional status and demonstrates the ability to make significant improvements in function in a reasonable and predictable amount of time.     Precautions / Restrictions Precautions Precautions: Fall Restrictions Weight Bearing Restrictions Per Provider Order: No      Mobility  Bed Mobility Overal bed mobility: Needs Assistance Bed Mobility: Sidelying to Sit, Sit to  Sidelying   Sidelying to sit: Mod assist, +2 for physical assistance, HOB elevated, Used rails     Sit to sidelying: Max assist, +2 for physical assistance, +2 for safety/equipment General bed mobility comments: Mod assist +2 to roll and rise from EOB, pt facilitates by reaching for therapist hands, needs guidance of LEs off bed and trunk support to rise. max A +2 to lower trunk and lift LEs into bed. Rolled to reduce friction on buttocks    Transfers Overall transfer level: Needs assistance Equipment used: 2 person hand held assist, 1 person hand held assist Transfers: Sit to/from Stand, Bed to chair/wheelchair/BSC Sit to Stand: +2 physical assistance, Mod assist   Step pivot transfers: Mod assist, +2 safety/equipment, +2 physical assistance       General transfer comment: Mod assist +2 to stand initially. Progresed to mod assit +1 with knee block, but able to remain standing with mod assist, knees unblocked, demonstrates posterior lean. Progressed with lateral steps along bed Mod A +2 for weight shift, balance and to facilitate steps; max VC.    Ambulation/Gait               General Gait Details: Unable to progress to gait training at this time.  Stairs            Wheelchair Mobility     Tilt Bed    Modified Rankin (Stroke Patients Only)       Balance Overall balance assessment: Needs assistance Sitting-balance support: Feet supported, No upper extremity supported Sitting balance-Leahy Scale: Fair Sitting balance - Comments: Initially min assist. progressed to CGA.   Standing balance support: Bilateral upper extremity supported, During  functional activity Standing balance-Leahy Scale: Poor                               Pertinent Vitals/Pain Pain Assessment Pain Assessment: PAINAD Breathing: normal Negative Vocalization: none Facial Expression: smiling or inexpressive Body Language: tense, distressed pacing, fidgeting Consolability:  distracted or reassured by voice/touch PAINAD Score: 2 Pain Location: sacral wound Pain Descriptors / Indicators: Grimacing Pain Intervention(s): Monitored during session, Repositioned, Limited activity within patient's tolerance    Home Living Family/patient expects to be discharged to:: Private residence Living Arrangements: Children                 Additional Comments: Pt is a poor historian due to dementia. Unable to gain any meaningful information about home set up or PLOF    Prior Function Prior Level of Function : Patient poor historian/Family not available                     Extremity/Trunk Assessment   Upper Extremity Assessment Upper Extremity Assessment: Defer to OT evaluation    Lower Extremity Assessment Lower Extremity Assessment: Generalized weakness;Difficult to assess due to impaired cognition    Cervical / Trunk Assessment Cervical / Trunk Assessment: Kyphotic (Forward head posture with rounded shoulders)  Communication   Communication Communication: No apparent difficulties  Cognition Arousal: Alert Behavior During Therapy: Anxious, Flat affect Overall Cognitive Status: No family/caregiver present to determine baseline cognitive functioning                                          General Comments      Exercises     Assessment/Plan    PT Assessment Patient needs continued PT services  PT Problem List Decreased strength;Decreased activity tolerance;Decreased balance;Decreased mobility;Decreased cognition;Decreased knowledge of use of DME;Decreased safety awareness;Decreased knowledge of precautions;Pain       PT Treatment Interventions DME instruction;Gait training;Stair training;Therapeutic activities;Functional mobility training;Therapeutic exercise;Balance training;Patient/family education;Neuromuscular re-education;Cognitive remediation;Wheelchair mobility training    PT Goals (Current goals can be found in the  Care Plan section)  Acute Rehab PT Goals Patient Stated Goal: None stated PT Goal Formulation: Patient unable to participate in goal setting Time For Goal Achievement: 01/13/24 Potential to Achieve Goals: Fair    Frequency Min 1X/week     Co-evaluation PT/OT/SLP Co-Evaluation/Treatment: Yes Reason for Co-Treatment: Complexity of the patient's impairments (multi-system involvement);Necessary to address cognition/behavior during functional activity;For patient/therapist safety;To address functional/ADL transfers PT goals addressed during session: Mobility/safety with mobility;Balance OT goals addressed during session: ADL's and self-care;Strengthening/ROM       AM-PAC PT "6 Clicks" Mobility  Outcome Measure Help needed turning from your back to your side while in a flat bed without using bedrails?: Total Help needed moving from lying on your back to sitting on the side of a flat bed without using bedrails?: Total Help needed moving to and from a bed to a chair (including a wheelchair)?: Total Help needed standing up from a chair using your arms (e.g., wheelchair or bedside chair)?: Total Help needed to walk in hospital room?: Total Help needed climbing 3-5 steps with a railing? : Total 6 Click Score: 6    End of Session Equipment Utilized During Treatment: Gait belt Activity Tolerance: Patient tolerated treatment well Patient left: with call bell/phone within reach;in bed;with bed alarm set (Rolled towards Rt  side) Nurse Communication: Other (comment) (Dark tarry stool in bed) PT Visit Diagnosis: Unsteadiness on feet (R26.81);Muscle weakness (generalized) (M62.81);Difficulty in walking, not elsewhere classified (R26.2);Pain Pain - part of body:  (buttock)    Time: 4098-1191 PT Time Calculation (min) (ACUTE ONLY): 18 min   Charges:   PT Evaluation $PT Eval Moderate Complexity: 1 Mod   PT General Charges $$ ACUTE PT VISIT: 1 Visit         Kathlyn Sacramento, PT, DPT [facility]  Health  Rehabilitation Services Physical Therapist Office: [phone] Website: [email or web address]   Berton Mount 12/30/2023, 11:21 AM

## 2023-12-30 NOTE — Progress Notes (Signed)
[facility] Liaison Note: This patient has a new referral for  [facility] outpatient-based palliative care.Her initial visit was scheduled for today.  We will follow for discharge disposition.  Please call for any outpatient based palliative care related questions or concerns. Thank you, Henderson Newcomer, LPN [facility] Liaison [phone]

## 2023-12-30 NOTE — Consult Note (Signed)
WOC Nurse wound follow up Wound type: Sacral wound after surgical debridement. Measurement: 6x7x2cm. Tunneling under the wound bed, lateralized 7 o'clock position. Muscle exposition. Wound bed: 60% red, 30% yellow, 10% dark red Drainage (amount, consistency, odor) odor, minimum amount, the dressing was change recently before my consult. Periwound: dark red, dark skin surrounding, old scar. Dressing procedure/placement/frequency: Dressing wet to dry, change daily. Cover with foam dressing to prevent further injuries, protects the wound bed and to assess the exudate through the treatment. Change every 3 days or PRN soiling. Topical treatment order because the family/medical team requested to be used as before my assessment.  WOC team will not plan to follow further.  Please reconsult if further assistance is needed. Thank-you,  Denyse Amass BSN, RN, [employer], WOC  (Pager: [phone])

## 2023-12-31 DIAGNOSIS — Z515 Encounter for palliative care: Secondary | ICD-10-CM

## 2023-12-31 DIAGNOSIS — Z66 Do not resuscitate: Secondary | ICD-10-CM

## 2023-12-31 DIAGNOSIS — I2699 Other pulmonary embolism without acute cor pulmonale: Secondary | ICD-10-CM | POA: Diagnosis not present

## 2023-12-31 DIAGNOSIS — I82220 Acute embolism and thrombosis of inferior vena cava: Secondary | ICD-10-CM | POA: Diagnosis not present

## 2023-12-31 DIAGNOSIS — I82411 Acute embolism and thrombosis of right femoral vein: Secondary | ICD-10-CM | POA: Diagnosis not present

## 2023-12-31 DIAGNOSIS — Z789 Other specified health status: Secondary | ICD-10-CM

## 2023-12-31 DIAGNOSIS — L98429 Non-pressure chronic ulcer of back with unspecified severity: Secondary | ICD-10-CM | POA: Diagnosis not present

## 2023-12-31 DIAGNOSIS — Z7189 Other specified counseling: Secondary | ICD-10-CM

## 2023-12-31 LAB — CBC WITH DIFFERENTIAL/PLATELET
Abs Immature Granulocytes: 1.03 10*3/uL — ABNORMAL HIGH (ref 0.00–0.07)
Abs Immature Granulocytes: 0 10*3/uL (ref 0.00–0.07)
Basophils Absolute: 0.1 10*3/uL (ref 0.0–0.1)
Basophils Absolute: 0 10*3/uL (ref 0.0–0.1)
Basophils Relative: 1 %
Basophils Relative: 0 %
Eosinophils Absolute: 0 10*3/uL (ref 0.0–0.5)
Eosinophils Absolute: 0 10*3/uL (ref 0.0–0.5)
Eosinophils Relative: 0 %
Eosinophils Relative: 0 %
HCT: 30.8 % — ABNORMAL LOW (ref 36.0–46.0)
HCT: 31.7 % — ABNORMAL LOW (ref 36.0–46.0)
Hemoglobin: 9.9 g/dL — ABNORMAL LOW (ref 12.0–15.0)
Hemoglobin: 10 g/dL — ABNORMAL LOW (ref 12.0–15.0)
Immature Granulocytes: 5 %
Lymphocytes Relative: 5 %
Lymphocytes Relative: 7 %
Lymphs Abs: 1.1 10*3/uL (ref 0.7–4.0)
Lymphs Abs: 1.5 10*3/uL (ref 0.7–4.0)
MCH: 28.2 pg (ref 26.0–34.0)
MCH: 28.2 pg (ref 26.0–34.0)
MCHC: 32.1 g/dL (ref 30.0–36.0)
MCHC: 31.5 g/dL (ref 30.0–36.0)
MCV: 87.7 fL (ref 80.0–100.0)
MCV: 89.5 fL (ref 80.0–100.0)
Monocytes Absolute: 1.4 10*3/uL — ABNORMAL HIGH (ref 0.1–1.0)
Monocytes Absolute: 0.8 10*3/uL (ref 0.1–1.0)
Monocytes Relative: 7 %
Monocytes Relative: 4 %
Neutro Abs: 17 10*3/uL — ABNORMAL HIGH (ref 1.7–7.7)
Neutro Abs: 18.7 10*3/uL — ABNORMAL HIGH (ref 1.7–7.7)
Neutrophils Relative %: 82 %
Neutrophils Relative %: 89 %
Platelets: 282 10*3/uL (ref 150–400)
Platelets: 300 10*3/uL (ref 150–400)
RBC: 3.51 MIL/uL — ABNORMAL LOW (ref 3.87–5.11)
RBC: 3.54 MIL/uL — ABNORMAL LOW (ref 3.87–5.11)
RDW: 17.8 % — ABNORMAL HIGH (ref 11.5–15.5)
RDW: 18.1 % — ABNORMAL HIGH (ref 11.5–15.5)
Smear Review: ADEQUATE
WBC: 20.6 10*3/uL — ABNORMAL HIGH (ref 4.0–10.5)
WBC: 21 10*3/uL — ABNORMAL HIGH (ref 4.0–10.5)
nRBC: 4.1 % — ABNORMAL HIGH (ref 0.0–0.2)
nRBC: 11 /100{WBCs} — ABNORMAL HIGH
nRBC: 6.7 % — ABNORMAL HIGH (ref 0.0–0.2)

## 2023-12-31 LAB — COMPREHENSIVE METABOLIC PANEL
ALT: 36 U/L (ref 0–44)
AST: 26 U/L (ref 15–41)
Albumin: 2.7 g/dL — ABNORMAL LOW (ref 3.5–5.0)
Alkaline Phosphatase: 55 U/L (ref 38–126)
Anion gap: 11 (ref 5–15)
BUN: 36 mg/dL — ABNORMAL HIGH (ref 8–23)
CO2: 23 mmol/L (ref 22–32)
Calcium: 9.3 mg/dL (ref 8.9–10.3)
Chloride: 115 mmol/L — ABNORMAL HIGH (ref 98–111)
Creatinine, Ser: 0.92 mg/dL (ref 0.44–1.00)
GFR, Estimated: >60 mL/min (ref >60)
Glucose, Bld: 164 mg/dL — ABNORMAL HIGH (ref 70–99)
Potassium: 4 mmol/L (ref 3.5–5.1)
Sodium: 149 mmol/L — ABNORMAL HIGH (ref 135–145)
Total Bilirubin: 0.4 mg/dL (ref 0.0–1.2)
Total Protein: 5.9 g/dL — ABNORMAL LOW (ref 6.5–8.1)

## 2023-12-31 LAB — PROCALCITONIN: Procalcitonin: 0.16 ng/mL

## 2023-12-31 MED ORDER — APIXABAN 5 MG PO TABS
5.0000 mg | ORAL_TABLET | Freq: Two times a day (BID) | ORAL | Status: DC
  Administered 2023-12-31 – 2024-01-03 (×7): 5 mg via ORAL
  Filled 2023-12-31 (×7): qty 1

## 2023-12-31 MED ORDER — SODIUM CHLORIDE 0.9 % IV SOLN: INTRAVENOUS | Status: AC

## 2023-12-31 NOTE — Progress Notes (Signed)
 [facility] Liaison Note  Received request for hospice services at home after discharge. Spoke with son, [name] to initiate education related to hospice philosophy, services, and team approach to care. Patient/family verbalized understanding of information given. Per discussion, the plan is for discharge home later this week.   DME needs discussed. Patient has a wheelchair, walker, and bedside commode, owned. Also has a hospital bed from another company that will be picked up. Patient/family requests the following equipment for delivery: hospital bed and overbed table. The address has been verified and is correct in the chart. [name] is the family contact to arrange time of equipment delivery.  Please send signed and completed DNR home with patient/family. Please provide prescriptions at discharge as needed to ensure ongoing symptom management.   [facility] information and contact numbers given to [place]. Above information shared with, Transitions of Care Manager. Please call with any questions or concerns.   Thank you for the opportunity to participate in this patient's care.   Elouise Husband BSN, [employer] [employer], OCN [employer] [phone]

## 2023-12-31 NOTE — Progress Notes (Signed)
 [name] 8:49 AM  Subjective: Patient seen and examined unfortunately son not at the bedside but discussed with patient's nurse and she has no new obvious complaints and no bowel movements reported or signs of bleeding according to the nurse  Objective: Vital signs stable afebrile no acute distress abdomen is soft nontender BUN and creatinine okay hemoglobin stable  Assessment: Seemingly resolved GI blood loss in patient over anticoagulated on heparin  with multiple medical problems and chart implies  about to begin palliative care  Plan: Please call me if he would like me to proceed with an endoscopy & son agrees and please call me if any other GI question or problem I can assist with otherwise continue pump inhibitors  [name] E  office [phone] After 5PM or if no answer call [phone]

## 2023-12-31 NOTE — Plan of Care (Signed)
   Problem: Elimination: Goal: Will not experience complications related to bowel motility Outcome: Progressing   Problem: Pain Managment: Goal: General experience of comfort will improve and/or be controlled Outcome: Progressing   Problem: Safety: Goal: Ability to remain free from injury will improve Outcome: Progressing

## 2023-12-31 NOTE — Consult Note (Signed)
 Consultation Note Date: 12/31/2023   Patient Name: [name]  DOB: 03/12/1941  MRN: [id number]  Age / Sex: 83 y.o., female  PCP: Arloa Elsie SAUNDERS, MD Referring Physician: Vernon Ranks, MD  Reason for Consultation: Establishing goals of care  HPI/Patient Profile: 83 y.o. female  with past medical history of dementia, decubitus ulcer, HTN, HLD, heart murmur, and osteoporosis was admitted on 12/28/2023 with PE/DVT and possible infection of stage IV decubitus ulcer of buttock. VVS was consulted and stated patient is not a good candidate for any surgical/endovascular intervention - recommend long term AC. GI was also consulted due to GIB after starting [facility] - recommending starting AC at lower dose, stated can consider EGD if melena persists.   Of note, patient has had 2 admissions and 1 ED visit in the last 6 months.   Clinical Assessment and Goals of Care: I have reviewed medical records including EPIC notes, labs, any available advanced directives, and imaging. Received report from primary RN - no acute concerns. RN reports patient's oral intake is minimal.   Went to visit patient at bedside - DIL/[name], friend, and neighbor present.Patient was lying in bed asleep - she does not wake to voice. No signs or non-verbal gestures of pain or discomfort noted. No respiratory distress, increased work of breathing, or secretions noted.   Olam indicates friend and neighbor at bedside also help care-give for patient. Son/Randy is on his way of the hospital - requests PMT return at 1:15pm.  1:15 PM Met with son/HCPOA/Randy, DIL/[name], SIL, and friend/caregiver in private 2W conference room to discuss diagnosis, prognosis, GOC, EOL wishes, disposition, and options.  I introduced Palliative Medicine as specialized medical care for people living with serious illness. It focuses on providing relief from the symptoms and stress of a  serious illness. The goal is to improve quality of life for both the patient and the family.  We discussed a brief life review of the patient as well as functional and nutritional status. Patient is widowed - she has 5 children. Son/Randy is HCPOA. Prior to hospitalization, patient was living in a private residence with [name] and [name]. Prior to surgical intervention to wound on December 16, 2023, patient was ambulating with a walker. Since this time, patient has been in pain and fearful of walking - she has been mostly bedbound with now worsening sacral wound. Patient had 24/7 supervision and support with family/caregivers. She also had HHPT and wound care visits. Family describe her appetite as up and down. Albumin  noted to be 2.7 on 12/31/23. Family state she has had a gradual decline over the last 3 months.   We discussed patient's current illness and what it means in the larger context of patient's on-going co-morbidities. Family have a clear understanding of patient's current acute medical situation. Provided updates and discussed GI recommendations for low dose Eliquis , which has been started today. Family  understands that dementia is a progressive, non-curable disease underlying the patient's current acute medical conditions. Natural disease trajectory and expectations at EOL were discussed. I attempted to elicit values and goals of care important to the patient. The difference between aggressive medical intervention and comfort care was considered in light of the patient's goals of care.   Advance directives, concepts specific to code status, and rehospitalization were considered and discussed. Patient has HCPOA and Living Will documents - copy obtained. DNR/DNI confirmed. Family state patient would not want rehospitalizations after discharge.  Provided education and counseling at length on the philosophy and  benefits of hospice care. Discussed that it offers a holistic approach to care in the  setting of end-stage illness, and is about supporting the patient where they are allowing nature to take it's course. Discussed the hospice team includes RNs, physicians, social workers, and chaplains. They can provide personal care, support for the family, and help keep patient out of the hospital as well as assist with DME needs for home hospice.   Family have experience working with home hospice services with [facility]. Patient had an [facility] evaluation for Palliative Care services scheduled; however, missed that appointment due to admission.  Discussion on the difference between Palliative and Hospice care. Palliative care and hospice have similar goals of managing symptoms, promoting comfort, improving quality of life, and maintaining a person's dignity. However, palliative care may be offered during any phase of a serious illness, while hospice care is usually offered when a person is expected to live for 6 months or less.  Family are most interested in patient's discharge with home hospice. They will need DME delivery prior to her discharge.   Discussed goals while admitted. Family would like to continue treating the treatable - allowing the 1-2 days recommended for monitoring patient on the low dose Eliquis  that was started today. While monitoring, can work on DME delivery.  Discussed with family the importance of continued conversation with each other and the medical providers regarding overall plan of care and treatment options, ensuring decisions are within the context of the patient's values and GOCs.    Questions and concerns were addressed. The patient/family was encouraged to call with questions and/or concerns. PMT card was provided.   Primary Decision Maker: HCPOA - son/[name]    SUMMARY OF RECOMMENDATIONS   Continue to treat the treatable - monitor for bleeding/side effects after starting low dose Eliquis  today and family hopeful for discharge in 1-2 days Goal is  for discharge home with hospice, requesting [facility] - TOC and hospice liaison notified; TOC consult placed DNR/DNI confirmed - durable DNR form completed and placed in shadow chart Obtained copy of HCPOA and Living Will. A copy of these documents, along with durable DNR form, was made and will be scanned into Vynca/ACP tab PMT will continue to follow peripherally. If there are any imminent needs please call the service directly   Code Status/Advance Care Planning: DNR  Palliative Prophylaxis:  Aspiration, Frequent Pain Assessment, Oral Care, and Turn Reposition  Additional Recommendations (Limitations, Scope, Preferences): Avoid Hospitalization, No Artificial Feeding, and No Tracheostomy  Psycho-social/Spiritual:  Desire for further Chaplaincy support:no Created space and opportunity for patient and family to express thoughts and feelings regarding patient's current medical situation.  Emotional support and therapeutic listening provided.  Prognosis:  < 6 months  Discharge Planning: Home with Hospice      Primary Diagnoses: Present on Admission:  Pulmonary embolism without acute cor pulmonale (HCC)  Decubitus ulcer of buttock  HYPERCHOLESTEROLEMIA  HYPERTENSION, BENIGN SYSTEMIC  DVT (deep vein thrombosis) in pregnancy   I have reviewed the medical record, interviewed the patient and family, and examined the patient. The following aspects are pertinent.  Past Medical History:  Diagnosis Date   Heart murmur    Hyperlipidemia    Hypertension    Osteoporosis    Seasonal allergies    Social History   Socioeconomic History   Marital status: Widowed    Spouse name: Not on file   Number of children: Not on file   Years of education: Not on file  Highest education level: Not on file  Occupational History   Not on file  Tobacco Use   Smoking status: Never   Smokeless tobacco: Never  Vaping Use   Vaping status: Never Used  Substance and Sexual Activity    Alcohol use: No   Drug use: No   Sexual activity: Not on file  Other Topics Concern   Not on file  Social History Narrative   Not on file   Social Drivers of Health   Financial Resource Strain: Not on file  Food Insecurity: No Food Insecurity (12/29/2023)   Hunger Vital Sign    Worried About Running Out of Food in the Last Year: Never true    Ran Out of Food in the Last Year: Never true  Transportation Needs: No Transportation Needs (12/29/2023)   PRAPARE - Administrator, Civil Service (Medical): No    Lack of Transportation (Non-Medical): No  Physical Activity: Not on file  Stress: Not on file  Social Connections: Socially Isolated (12/29/2023)   Social Connection and Isolation Panel [NHANES]    Frequency of Communication with Friends and Family: Never    Frequency of Social Gatherings with Friends and Family: Never    Attends Religious Services: More than 4 times per year    Active Member of [employer] or Organizations: No    Attends Banker Meetings: Never    Marital Status: Widowed   Family History  Problem Relation Age of Onset   Diabetes Mother    CAD Father    Diabetes Sister    Diabetes Brother    Diabetes Sister    Diabetes Sister    Cancer - Colon Brother    Cancer Brother    Alzheimer's disease Brother    Scheduled Meds:  amoxicillin -clavulanate  1 tablet Oral Q12H   apixaban   5 mg Oral BID   atorvastatin   40 mg Oral Daily   feeding supplement  237 mL Oral BID BM   fesoterodine   4 mg Oral Daily   memantine   10 mg Oral BID   multivitamin with minerals  1 tablet Oral Daily   pantoprazole  (PROTONIX ) IV  40 mg Intravenous Q12H   QUEtiapine   25 mg Oral Q0600   Continuous Infusions: PRN Meds:.acetaminophen  **OR** acetaminophen , HYDROcodone -acetaminophen , mirtazapine , morphine  injection, ondansetron  **OR** ondansetron  (ZOFRAN ) IV Medications Prior to Admission:  Prior to Admission medications   Medication Sig Start Date End Date Taking?  Authorizing Provider  acetaminophen  (TYLENOL ) 500 MG tablet Take 1 tablet (500 mg total) by mouth every 6 (six) hours as needed for fever. 12/18/23  Yes Dezii, Alexandra, DO  atorvastatin  (LIPITOR) 40 MG tablet Take 40 mg by mouth daily.   Yes [provider]  calcium -vitamin D (OSCAL) 250-125 MG-UNIT per tablet Take 1 tablet by mouth daily.   Yes [provider]  memantine  (NAMENDA ) 10 MG tablet Take 1 tablet (10 mg total) by mouth 2 (two) times daily. 11/06/23  Yes   mirtazapine  (REMERON ) 7.5 MG tablet Take 1 tablet (7.5 mg total) by mouth at bedtime as needed for sleep 11/06/23  Yes   Multiple Vitamin (MULTIVITAMIN) tablet Take 1 tablet by mouth daily.   Yes [provider]  QUEtiapine  (SEROQUEL ) 25 MG tablet Take 1 tablet (25 mg total) by mouth at bedtime as needed for sleep/confusion. Patient taking differently: Take 25 mg by mouth at bedtime. 11/06/23  Yes   solifenacin  (VESICARE ) 5 MG tablet Take 1 tablet (5 mg total) by mouth every evening.  12/03/23  Yes   triamcinolone  cream (KENALOG ) 0.1 % Apply 1 Application topically daily as needed (for itching). 10/01/23  Yes [provider]   Allergies  Allergen Reactions   Aspirin  Nausea And Vomiting    High doses   Review of Systems  Unable to perform ROS: Dementia    Physical Exam Vitals and nursing note reviewed.  Constitutional:      General: She is not in acute distress.    Appearance: She is ill-appearing.  Pulmonary:     Effort: No respiratory distress.  Skin:    General: Skin is warm and dry.  Neurological:     Comments: asleep     Vital Signs: BP (!) 146/65 (BP Location: Right Arm)   Pulse 84   Temp 98.4 F (36.9 C) (Oral)   Resp 20   Ht 5' 2 (1.575 m)   Wt 65.3 kg   LMP  (LMP Unknown)   SpO2 100%   BMI 26.33 kg/m  Pain Scale: Faces   Pain Score: 0-No pain   SpO2: SpO2: 100 % O2 Device:SpO2: 100 % O2 Flow Rate: .   IO: Intake/output summary: No intake or output data in  the 24 hours ending 12/31/23 1218  LBM: Last BM Date : 12/30/23 Baseline Weight: Weight: 66.7 kg Most recent weight: Weight: 65.3 kg     Palliative Assessment/Data:     Time In-Out: 1215-1245/1315-1400 Time Total: 75 minutes  Signed by: Jeoffrey CHRISTELLA Sharps, NP   Please contact Palliative Medicine Team phone at [phone] for questions and concerns.  For individual provider: See Amion  *Portions of this note are a verbal dictation therefore any spelling and/or grammatical errors are due to the Dragon Medical One system interpretation.

## 2023-12-31 NOTE — Care Management Important Message (Signed)
Important Message  Patient Details  Name: [name] MRN: [id number] Date of Birth: Apr 08, 1941   Important Message Given:  Yes - Medicare IM     Dorena Bodo 12/31/2023, 2:23 PM

## 2023-12-31 NOTE — TOC Progression Note (Signed)
 Transition of Care [facility]) - Progression Note    Patient Details  Name: [name] MRN: [id number] Date of Birth: 09/21/1941  [facility] CM/SW Contact  Rosaline JONELLE Joe, RN Phone Number: 12/31/2023, 2:45 PM  Clinical Narrative:    CM spoke with Palliative Care team and the patient is currently active with [facility] for Palliative but family would like referral placed for home hospital.  Patient will need new DME sent to the home prior to discharge to home.  [facility] CM, Melissa was updated regarding request for home hospice services.  CM will continue to follow the patient for [facility] needs to return home with hospice once DME is delivered to the home and patient is stable for discharge - later this week.        Expected Discharge Plan and Services                                               Social Determinants of Health (SDOH) Interventions SDOH Screenings   Food Insecurity: No Food Insecurity (12/29/2023)  Housing: Low Risk  (12/29/2023)  Transportation Needs: No Transportation Needs (12/29/2023)  Utilities: Not At Risk (12/29/2023)  Social Connections: Socially Isolated (12/29/2023)  Tobacco Use: Low Risk  (12/30/2023)    Readmission Risk Interventions     No data to display

## 2023-12-31 NOTE — Progress Notes (Signed)
 PROGRESS NOTE    [name]  FMW:[id number] DOB: [birth year] DOA: 12/28/2023 PCP: Arloa Elsie SAUNDERS, MD   Brief Narrative:  83 y.o. F with advanced dementia, lives at home, speech limited to short phrases/nonsensical and mostly nonambulatory at baseline, recent admission for infected sacral decubitus ulcer requiring surgical debridement who presented with leg swelling. Found to have extensive DVT to the iliacs and IVC as well as small PE.  Vascular surgery consulted and admitted on heparin .  Details below.  Assessment & Plan:   Principal Problem:   Pulmonary embolism without acute cor pulmonale (HCC) Active Problems:   Decubitus ulcer of buttock   HYPERCHOLESTEROLEMIA   HYPERTENSION, BENIGN SYSTEMIC   DVT (deep vein thrombosis) in pregnancy  Extensive right lower extremity DVT / Pulmonary embolism, acute Discussed with vascular surgery, despite the extensive nature, her swelling does not appear to be particular symptomatic at present, and with her limited ambulatory status, advanced dementia, risks of intervention outweigh possible benefits, and vascular surgery recommended against thrombectomy.  Patient was on heparin , transitioned to Eliquis  but then she developed melena suspicious of upper GI bleed.  Anticoagulation was held, GI consulted.  GI did not recommend any intervention due to advanced age and dementia and recommended trial of low-dose of anticoagulation.  Patient was started on Eliquis  10 mg p.o. twice daily and plan was to continue for 7 days, we will forego that and start her on only 5 mg p.o. twice daily.  Will observe her for 1 to 2 days.  If she develops bleeding then GI will be contacted again and unfortunately, the only option at that point will be to consider IVC filter placement.  Black tarry stool/presumed GI bleed: Developed black tarry stools after starting on Eliquis , Eliquis  held, GI consulted, GI did not offer any intervention due to advanced age and dementia.   They recommended starting on low-dose of anticoagulation.  Hemoglobin fortunately has remained stable for last 2 days.  Continue Protonix  and monitor H&H every 12 hours.   Stage IV decubitus ulcer, present on admission /Possible mild wound infection There was some concern for infection on presentation, but procalcitonin negative, there is no purulence in the pictures yesterday, nor is there any purulence today.  No surrounding erythema, induration, or drainage.CT showed no evidence of osteomyelitis, given her dementia, she is unfortunately unable to sit still enough for MRI.  Wound care consulted.  Wet-to-dry dressings.  Antibiotics were transitioned to Augmentin  for 5 days.  Mild hypernatremia: 149 today.  May be hypovolemic/due to dehydration.  Will gently hydrate her.  Monitor for now.  Repeat labs in the morning.   Dementia - Continue Seroquel  -Resume home Vesicare  and mirtazapine    Hypertension Blood pressure normal, not on medication at home   Hypercholesterolemia -Continue Lipitor   Positive blood culture Coag negative staph in 1 of 2 blood cultures, suspect this is contaminant. - Follow second blood culture, no growth in 2 days  GOC: Palliative care consulted.  DVT prophylaxis:    Code Status: Limited: Do not attempt resuscitation (DNR) -DNR-LIMITED -Do Not Intubate/DNI   Family Communication:  None present at bedside.  Tried calling son.  Palliative care is consulted and will discuss plan with son.  Status is: Inpatient Remains inpatient appropriate because: Needs to be watched another 1 to 2 days with resumption of the Eliquis .   Estimated body mass index is 26.33 kg/m as calculated from the following:   Height as of this encounter: 5' 2 (1.575 m).  Weight as of this encounter: 65.3 kg.  Pressure Injury 12/16/23 Buttocks Left Unstageable - Full thickness tissue loss in which the base of the injury is covered by slough (yellow, tan, gray, green or brown) and/or eschar  (tan, brown or black) in the wound bed. (Active)  12/16/23 0100  Location: Buttocks  Location Orientation: Left  Staging: Unstageable - Full thickness tissue loss in which the base of the injury is covered by slough (yellow, tan, gray, green or brown) and/or eschar (tan, brown or black) in the wound bed.  Wound Description (Comments):   Present on Admission: Yes  Dressing Type Gauze (Comment) 12/29/23 1947   Nutritional Assessment: Body mass index is 26.33 kg/m.SABRA Seen by dietician.  I agree with the assessment and plan as outlined below: Nutrition Status: Nutrition Problem: Increased nutrient needs Etiology: wound healing Signs/Symptoms: estimated needs Interventions: Ensure Enlive (each supplement provides 350kcal and 20 grams of protein), MVI  . Skin Assessment: I have examined the patient's skin and I agree with the wound assessment as performed by the wound care RN as outlined below: Pressure Injury 12/16/23 Buttocks Left Unstageable - Full thickness tissue loss in which the base of the injury is covered by slough (yellow, tan, gray, green or brown) and/or eschar (tan, brown or black) in the wound bed. (Active)  12/16/23 0100  Location: Buttocks  Location Orientation: Left  Staging: Unstageable - Full thickness tissue loss in which the base of the injury is covered by slough (yellow, tan, gray, green or brown) and/or eschar (tan, brown or black) in the wound bed.  Wound Description (Comments):   Present on Admission: Yes  Dressing Type Gauze (Comment) 12/29/23 1947    Consultants:  GI and vascular surgery  Procedures:  None  Antimicrobials:  Anti-infectives (From admission, onward)    Start     Dose/Rate Route Frequency Ordered Stop   12/30/23 1000  amoxicillin -clavulanate (AUGMENTIN ) 875-125 MG per tablet 1 tablet        1 tablet Oral Every 12 hours 12/29/23 1718     12/29/23 2100  vancomycin  (VANCOREADY) IVPB 750 mg/150 mL  Status:  Discontinued       Placed in  Followed by Linked Group   750 mg 150 mL/hr over 60 Minutes Intravenous Every 24 hours 12/28/23 2054 12/29/23 1718   12/28/23 2100  metroNIDAZOLE  (FLAGYL ) IVPB 500 mg  Status:  Discontinued        500 mg 100 mL/hr over 60 Minutes Intravenous Every 12 hours 12/28/23 2045 12/29/23 1718   12/28/23 2100  ceFEPIme  (MAXIPIME ) 2 g in sodium chloride  0.9 % 100 mL IVPB  Status:  Discontinued        2 g 200 mL/hr over 30 Minutes Intravenous Every 12 hours 12/28/23 2054 12/29/23 1718   12/28/23 2100  vancomycin  (VANCOREADY) IVPB 1250 mg/250 mL       Placed in Followed by Linked Group   1,250 mg 166.7 mL/hr over 90 Minutes Intravenous  Once 12/28/23 2054 12/29/23 0432         Subjective: Patient seen and examined, patient was sleepy but arousable.  When she woke up, she would not talk much.  Objective: Vitals:   12/30/23 0900 12/30/23 1951 12/31/23 0040 12/31/23 0536  BP:  128/67 (!) 150/69 (!) 146/65  Pulse:  99 80 84  Resp:  16 18 20   Temp:  99.4 F (37.4 C) 98 F (36.7 C) 98.4 F (36.9 C)  TempSrc:  Axillary Oral Oral  SpO2:  100%  100% 100%  Weight: 65.3 kg     Height:       No intake or output data in the 24 hours ending 12/31/23 1337 Filed Weights   12/28/23 1821 12/30/23 0900  Weight: 66.7 kg 65.3 kg    Examination:  General exam: Appears calm and comfortable  Respiratory system: Clear to auscultation. Respiratory effort normal. Cardiovascular system: S1 & S2 heard, RRR. No JVD, murmurs, rubs, gallops or clicks. No pedal edema. Gastrointestinal system: Abdomen is nondistended, soft and nontender. No organomegaly or masses felt. Normal bowel sounds heard. Central nervous system: Sleepy and is not talking much, likely not oriented.   Data Reviewed: I have personally reviewed following labs and imaging studies  CBC: Recent Labs  Lab 12/28/23 1549 12/30/23 0632 12/30/23 1324 12/30/23 2234 12/31/23 0558  WBC 16.4* 24.1*  --   --  20.6*  NEUTROABS 13.6*  --   --    --  17.0*  HGB 12.6 10.7* 10.1* 9.4* 9.9*  HCT 39.5 33.5* 31.4* 29.1* 30.8*  MCV 87.2 86.6  --   --  87.7  PLT 209 267  --   --  282   Basic Metabolic Panel: Recent Labs  Lab 12/28/23 1549 12/30/23 0632 12/31/23 0558  NA 143 145 149*  K 3.6 4.1 4.0  CL 105 113* 115*  CO2 23 21* 23  GLUCOSE 107* 178* 164*  BUN 15 37* 36*  CREATININE 0.92 0.96 0.92  CALCIUM  9.2 8.7* 9.3  MG 2.5*  --   --   PHOS 3.5  --   --    GFR: Estimated Creatinine Clearance: 41.8 mL/min (by C-G formula based on SCr of 0.92 mg/dL). Liver Function Tests: Recent Labs  Lab 12/28/23 1549 12/30/23 0632 12/31/23 0558  AST 45* 29 26  ALT 60* 39 36  ALKPHOS 62 54 55  BILITOT 1.0 0.6 0.4  PROT 6.6 5.7* 5.9*  ALBUMIN  2.8* 2.5* 2.7*   No results for input(s): LIPASE, AMYLASE in the last 168 hours. No results for input(s): AMMONIA in the last 168 hours. Coagulation Profile: No results for input(s): INR, PROTIME in the last 168 hours. Cardiac Enzymes: Recent Labs  Lab 12/28/23 1549  CKTOTAL 204   BNP (last 3 results) No results for input(s): PROBNP in the last 8760 hours. HbA1C: No results for input(s): HGBA1C in the last 72 hours. CBG: No results for input(s): GLUCAP in the last 168 hours. Lipid Profile: No results for input(s): CHOL, HDL, LDLCALC, TRIG, CHOLHDL, LDLDIRECT in the last 72 hours. Thyroid  Function Tests: Recent Labs    12/28/23 1549  TSH 1.599   Anemia Panel: No results for input(s): VITAMINB12, FOLATE, FERRITIN, TIBC, IRON, RETICCTPCT in the last 72 hours. Sepsis Labs: Recent Labs  Lab 12/28/23 1549 12/28/23 1559 12/28/23 2057 12/31/23 0558  PROCALCITON <0.10  --   --  0.16  LATICACIDVEN  --  1.9 0.9  --     Recent Results (from the past 240 hours)  Culture, blood (routine x 2)     Status: None (Preliminary result)   Collection Time: 12/28/23  2:33 PM   Specimen: BLOOD LEFT FOREARM  Result Value Ref Range Status   Specimen  Description BLOOD LEFT FOREARM  Final   Special Requests   Final    BOTTLES DRAWN AEROBIC AND ANAEROBIC Blood Culture results may not be optimal due to an inadequate volume of blood received in culture bottles   Culture   Final    NO GROWTH 3 DAYS  Performed at [facility], [address], [place], [place] [address]    Report Status PENDING  Incomplete  Culture, blood (routine x 2)     Status: Abnormal   Collection Time: 12/28/23  3:52 PM   Specimen: BLOOD RIGHT ARM  Result Value Ref Range Status   Specimen Description BLOOD RIGHT ARM  Final   Special Requests   Final    BOTTLES DRAWN AEROBIC AND ANAEROBIC Blood Culture results may not be optimal due to an inadequate volume of blood received in culture bottles   Culture  Setup Time   Final    GRAM POSITIVE COCCI IN CLUSTERS AEROBIC BOTTLE ONLY CRITICAL RESULT CALLED TO, READ BACK BY AND VERIFIED WITH: PHARMD TONY RUDISILL [id number] AT 1536 BY EC    Culture (A)  Final    STAPHYLOCOCCUS EPIDERMIDIS THE SIGNIFICANCE OF ISOLATING THIS ORGANISM FROM A SINGLE SET OF BLOOD CULTURES WHEN MULTIPLE SETS ARE DRAWN IS UNCERTAIN. PLEASE NOTIFY THE MICROBIOLOGY DEPARTMENT WITHIN ONE WEEK IF SPECIATION AND SENSITIVITIES ARE REQUIRED. Performed at [facility], [address], [place], [place] [address]    Report Status 12/30/2023 FINAL  Final  Blood Culture ID Panel (Reflexed)     Status: Abnormal   Collection Time: 12/28/23  3:52 PM  Result Value Ref Range Status   Enterococcus faecalis NOT DETECTED NOT DETECTED Final   Enterococcus Faecium NOT DETECTED NOT DETECTED Final   Listeria monocytogenes NOT DETECTED NOT DETECTED Final   Staphylococcus species DETECTED (A) NOT DETECTED Final    Comment: CRITICAL RESULT CALLED TO, READ BACK BY AND VERIFIED WITH: PHARMD TONY RUDISILL [id number] AT 1536 BY EC    Staphylococcus aureus (BCID) NOT DETECTED NOT DETECTED Final   Staphylococcus epidermidis DETECTED (A) NOT DETECTED Final    Comment:  Methicillin (oxacillin) resistant coagulase negative staphylococcus. Possible blood culture contaminant (unless isolated from more than one blood culture draw or clinical case suggests pathogenicity). No antibiotic treatment is indicated for blood  culture contaminants. CRITICAL RESULT CALLED TO, READ BACK BY AND VERIFIED WITH: PHARMD TONY RUDISILL [id number] AT 1536 BY EC    Staphylococcus lugdunensis NOT DETECTED NOT DETECTED Final   Streptococcus species NOT DETECTED NOT DETECTED Final   Streptococcus agalactiae NOT DETECTED NOT DETECTED Final   Streptococcus pneumoniae NOT DETECTED NOT DETECTED Final   Streptococcus pyogenes NOT DETECTED NOT DETECTED Final   A.calcoaceticus-baumannii NOT DETECTED NOT DETECTED Final   Bacteroides fragilis NOT DETECTED NOT DETECTED Final   Enterobacterales NOT DETECTED NOT DETECTED Final   Enterobacter cloacae complex NOT DETECTED NOT DETECTED Final   Escherichia coli NOT DETECTED NOT DETECTED Final   Klebsiella aerogenes NOT DETECTED NOT DETECTED Final   Klebsiella oxytoca NOT DETECTED NOT DETECTED Final   Klebsiella pneumoniae NOT DETECTED NOT DETECTED Final   Proteus species NOT DETECTED NOT DETECTED Final   Salmonella species NOT DETECTED NOT DETECTED Final   Serratia marcescens NOT DETECTED NOT DETECTED Final   Haemophilus influenzae NOT DETECTED NOT DETECTED Final   Neisseria meningitidis NOT DETECTED NOT DETECTED Final   Pseudomonas aeruginosa NOT DETECTED NOT DETECTED Final   Stenotrophomonas maltophilia NOT DETECTED NOT DETECTED Final   Candida albicans NOT DETECTED NOT DETECTED Final   Candida auris NOT DETECTED NOT DETECTED Final   Candida glabrata NOT DETECTED NOT DETECTED Final   Candida krusei NOT DETECTED NOT DETECTED Final   Candida parapsilosis NOT DETECTED NOT DETECTED Final   Candida tropicalis NOT DETECTED NOT DETECTED Final   Cryptococcus neoformans/gattii  NOT DETECTED NOT DETECTED Final   Methicillin resistance mecA/C DETECTED  (A) NOT DETECTED Final    Comment: CRITICAL RESULT CALLED TO, READ BACK BY AND VERIFIED WITH: PHARMD TONY RUDISILL [id number] AT 1536 BY EC Performed at [facility], [address] [place], [place] [address]      Radiology Studies: No results found.  Scheduled Meds:  amoxicillin -clavulanate  1 tablet Oral Q12H   apixaban   5 mg Oral BID   atorvastatin   40 mg Oral Daily   feeding supplement  237 mL Oral BID BM   fesoterodine   4 mg Oral Daily   memantine   10 mg Oral BID   multivitamin with minerals  1 tablet Oral Daily   pantoprazole  (PROTONIX ) IV  40 mg Intravenous Q12H   QUEtiapine   25 mg Oral Q0600   Continuous Infusions:   LOS: 3 days   Fredia Skeeter, MD [facility]  12/31/2023, 1:37 PM   *Please note that this is a verbal dictation therefore any spelling or grammatical errors are due to the Dragon [employer] One system interpretation.  Please page via Amion and do not message via secure chat for urgent patient care matters. Secure chat can be used for non urgent patient care matters.  How to contact the [facility] Attending or Consulting provider 7A - 7P or covering provider during after hours 7P -7A, for this patient?  Check the care team in [facility] and look for a) attending/consulting [facility] provider listed and b) the [facility] team listed. Page or secure chat 7A-7P. Log into [email or web address] and use [place]'s universal password to access. If you do not have the password, please contact the hospital operator. Locate the [facility] provider you are looking for under [facility] and page to a number that you can be directly reached. If you still have difficulty reaching the provider, please page the [facility] (Director on Call) for the Hospitalists listed on amion for assistance.

## 2024-01-01 DIAGNOSIS — L98499 Non-pressure chronic ulcer of skin of other sites with unspecified severity: Secondary | ICD-10-CM

## 2024-01-01 DIAGNOSIS — I82411 Acute embolism and thrombosis of right femoral vein: Secondary | ICD-10-CM

## 2024-01-01 DIAGNOSIS — I82220 Acute embolism and thrombosis of inferior vena cava: Secondary | ICD-10-CM

## 2024-01-01 DIAGNOSIS — L089 Local infection of the skin and subcutaneous tissue, unspecified: Secondary | ICD-10-CM

## 2024-01-01 DIAGNOSIS — F03918 Unspecified dementia, unspecified severity, with other behavioral disturbance: Secondary | ICD-10-CM | POA: Diagnosis not present

## 2024-01-01 LAB — COMPREHENSIVE METABOLIC PANEL
ALT: 35 U/L (ref 0–44)
AST: 33 U/L (ref 15–41)
Albumin: 2.7 g/dL — ABNORMAL LOW (ref 3.5–5.0)
Alkaline Phosphatase: 55 U/L (ref 38–126)
Anion gap: 11 (ref 5–15)
BUN: 23 mg/dL (ref 8–23)
CO2: 23 mmol/L (ref 22–32)
Calcium: 8.9 mg/dL (ref 8.9–10.3)
Chloride: 116 mmol/L — ABNORMAL HIGH (ref 98–111)
Creatinine, Ser: 0.78 mg/dL (ref 0.44–1.00)
GFR, Estimated: >60 mL/min (ref >60)
Glucose, Bld: 114 mg/dL — ABNORMAL HIGH (ref 70–99)
Potassium: 3.6 mmol/L (ref 3.5–5.1)
Sodium: 150 mmol/L — ABNORMAL HIGH (ref 135–145)
Total Bilirubin: 0.5 mg/dL (ref 0.0–1.2)
Total Protein: 5.8 g/dL — ABNORMAL LOW (ref 6.5–8.1)

## 2024-01-01 LAB — CBC WITH DIFFERENTIAL/PLATELET
Abs Immature Granulocytes: 0.6 10*3/uL — ABNORMAL HIGH (ref 0.00–0.07)
Abs Immature Granulocytes: 0 10*3/uL (ref 0.00–0.07)
Basophils Absolute: 0.1 10*3/uL (ref 0.0–0.1)
Basophils Absolute: 0 10*3/uL (ref 0.0–0.1)
Basophils Relative: 1 %
Basophils Relative: 0 %
Eosinophils Absolute: 0 10*3/uL (ref 0.0–0.5)
Eosinophils Absolute: 0 10*3/uL (ref 0.0–0.5)
Eosinophils Relative: 0 %
Eosinophils Relative: 0 %
HCT: 30 % — ABNORMAL LOW (ref 36.0–46.0)
HCT: 28.2 % — ABNORMAL LOW (ref 36.0–46.0)
Hemoglobin: 9.2 g/dL — ABNORMAL LOW (ref 12.0–15.0)
Hemoglobin: 8.6 g/dL — ABNORMAL LOW (ref 12.0–15.0)
Immature Granulocytes: 4 %
Lymphocytes Relative: 10 %
Lymphocytes Relative: 7 %
Lymphs Abs: 1.5 10*3/uL (ref 0.7–4.0)
Lymphs Abs: 0.9 10*3/uL (ref 0.7–4.0)
MCH: 28 pg (ref 26.0–34.0)
MCH: 28 pg (ref 26.0–34.0)
MCHC: 30.7 g/dL (ref 30.0–36.0)
MCHC: 30.5 g/dL (ref 30.0–36.0)
MCV: 91.5 fL (ref 80.0–100.0)
MCV: 91.9 fL (ref 80.0–100.0)
Monocytes Absolute: 0.8 10*3/uL (ref 0.1–1.0)
Monocytes Absolute: 0.1 10*3/uL (ref 0.1–1.0)
Monocytes Relative: 5 %
Monocytes Relative: 1 %
Neutro Abs: 12.3 10*3/uL — ABNORMAL HIGH (ref 1.7–7.7)
Neutro Abs: 12.4 10*3/uL — ABNORMAL HIGH (ref 1.7–7.7)
Neutrophils Relative %: 80 %
Neutrophils Relative %: 92 %
Platelets: 260 10*3/uL (ref 150–400)
Platelets: 263 10*3/uL (ref 150–400)
RBC: 3.28 MIL/uL — ABNORMAL LOW (ref 3.87–5.11)
RBC: 3.07 MIL/uL — ABNORMAL LOW (ref 3.87–5.11)
RDW: 18.5 % — ABNORMAL HIGH (ref 11.5–15.5)
RDW: 18.4 % — ABNORMAL HIGH (ref 11.5–15.5)
WBC: 15.4 10*3/uL — ABNORMAL HIGH (ref 4.0–10.5)
WBC: 13.5 10*3/uL — ABNORMAL HIGH (ref 4.0–10.5)
nRBC: 7.2 % — ABNORMAL HIGH (ref 0.0–0.2)
nRBC: 4 /100{WBCs} — ABNORMAL HIGH
nRBC: 6.8 % — ABNORMAL HIGH (ref 0.0–0.2)

## 2024-01-01 NOTE — Progress Notes (Addendum)
 Progress Note   Patient: [name] FMW:[id number] DOB: 11-May-1941 DOA: 12/28/2023     4 DOS: the patient was seen and examined on 01/01/2024   Brief hospital course: No notes on file  Assessment and Plan: Extensive right lower extremity DVT / Pulmonary embolism, acute Discussed with vascular surgery, despite the extensive nature, her swelling does not appear to be particular symptomatic at present, and with her limited ambulatory status, advanced dementia, risks of intervention outweigh possible benefits, and vascular surgery recommended against thrombectomy.  Patient was on heparin , transitioned to Eliquis  but then she developed melena suspicious of upper GI bleed.  Anticoagulation was held, GI consulted.  GI did not recommend any intervention due to advanced age and dementia and recommended trial of low-dose of anticoagulation.  Patient was started on Eliquis  10 mg p.o. twice daily and plan was to continue for 7 days, we will forego that and start her on only 5 mg p.o. twice daily.  Will observe her for 1 to 2 days.  If she develops bleeding then GI will be contacted again and unfortunately, the only option at that point will be to consider IVC filter placement.   Black tarry stool/presumed GI bleed: Developed black tarry stools after starting on Eliquis , Eliquis  held, GI consulted, GI did not offer any intervention due to advanced age and dementia.  They recommended starting on low-dose of anticoagulation.  Hemoglobin fortunately has remained stable for last 2 days.  Continue Protonix  and monitor H&H every 12 hours.   Stage IV decubitus ulcer, present on admission /Possible mild wound infection There was some concern for infection on presentation, but procalcitonin negative, there is no purulence in the pictures yesterday, nor is there any purulence today.  No surrounding erythema, induration, or drainage.CT showed no evidence of osteomyelitis, given her dementia, she is unfortunately unable to  sit still enough for MRI.  Wound care consulted.  Wet-to-dry dressings.  Antibiotics were transitioned to Augmentin  for 5 days.   Mild hypernatremia: 149 today.  May be hypovolemic/due to dehydration.  Will gently hydrate her.  Monitor for now.  Repeat labs in the morning.   Dementia - Continue Seroquel  -Resume home Vesicare  and mirtazapine    Hypertension Blood pressure normal, not on medication at home   Hypercholesterolemia -Continue Lipitor   Positive blood culture Coag negative staph in 1 of 2 blood cultures, suspect this is contaminant. - Follow second blood culture, no growth thus far    Subjective: Without complaints  Physical Exam: Vitals:   12/31/23 1656 12/31/23 2131 01/01/24 0418 01/01/24 0848  BP: 133/64 (!) 141/80 103/70 131/61  Pulse: 98 91 86 80  Resp: 17 19 19 18   Temp:  98.3 F (36.8 C) 98.1 F (36.7 C) 98 F (36.7 C)  TempSrc:      SpO2: 100% 100% 100% 100%  Weight:      Height:       General exam: Awake, laying in bed, in nad Respiratory system: Normal respiratory effort, no wheezing Cardiovascular system: regular rate, s1, s2 Gastrointestinal system: Soft, nondistended, positive BS Central nervous system: CN2-12 grossly intact, strength intact Extremities: Perfused, no clubbing Skin: Normal skin turgor, no notable skin lesions seen Psychiatry: Mood normal // no visual hallucinations   Data Reviewed:  Labs reviewed: Na 150, K 3.6, Cr 0.78  Family Communication: Pt in room, family not at bedside  Disposition: Status is: Inpatient Remains inpatient appropriate because: severity of illness  Planned Discharge Destination: Skilled nursing facility    Author:  Garnette Pelt, MD 01/01/2024 2:55 PM  For on call review [email or web address].

## 2024-01-01 NOTE — Hospital Course (Signed)
 83 y.o. F with advanced dementia, lives at home, speech limited to short phrases/nonsensical and mostly nonambulatory at baseline, recent admission for infected sacral decubitus ulcer requiring surgical debridement who presented with leg swelling. Found to have extensive DVT to the iliacs and IVC as well as small PE.  Vascular surgery consulted and admitted on heparin .  Details below.

## 2024-01-01 NOTE — Plan of Care (Signed)

## 2024-01-02 DIAGNOSIS — I82220 Acute embolism and thrombosis of inferior vena cava: Secondary | ICD-10-CM | POA: Diagnosis not present

## 2024-01-02 DIAGNOSIS — F03918 Unspecified dementia, unspecified severity, with other behavioral disturbance: Secondary | ICD-10-CM | POA: Diagnosis not present

## 2024-01-02 DIAGNOSIS — I82411 Acute embolism and thrombosis of right femoral vein: Secondary | ICD-10-CM | POA: Diagnosis not present

## 2024-01-02 LAB — CBC WITH DIFFERENTIAL/PLATELET
Abs Immature Granulocytes: 0.21 10*3/uL — ABNORMAL HIGH (ref 0.00–0.07)
Abs Immature Granulocytes: 0.17 10*3/uL — ABNORMAL HIGH (ref 0.00–0.07)
Basophils Absolute: 0 10*3/uL (ref 0.0–0.1)
Basophils Absolute: 0 10*3/uL (ref 0.0–0.1)
Basophils Relative: 0 %
Basophils Relative: 0 %
Eosinophils Absolute: 0.1 10*3/uL (ref 0.0–0.5)
Eosinophils Absolute: 0.1 10*3/uL (ref 0.0–0.5)
Eosinophils Relative: 1 %
Eosinophils Relative: 1 %
HCT: 28.9 % — ABNORMAL LOW (ref 36.0–46.0)
HCT: 26.8 % — ABNORMAL LOW (ref 36.0–46.0)
Hemoglobin: 8.8 g/dL — ABNORMAL LOW (ref 12.0–15.0)
Hemoglobin: 8.2 g/dL — ABNORMAL LOW (ref 12.0–15.0)
Immature Granulocytes: 2 %
Immature Granulocytes: 2 %
Lymphocytes Relative: 5 %
Lymphocytes Relative: 6 %
Lymphs Abs: 0.6 10*3/uL — ABNORMAL LOW (ref 0.7–4.0)
Lymphs Abs: 0.6 10*3/uL — ABNORMAL LOW (ref 0.7–4.0)
MCH: 28.2 pg (ref 26.0–34.0)
MCH: 28 pg (ref 26.0–34.0)
MCHC: 30.4 g/dL (ref 30.0–36.0)
MCHC: 30.6 g/dL (ref 30.0–36.0)
MCV: 92.6 fL (ref 80.0–100.0)
MCV: 91.5 fL (ref 80.0–100.0)
Monocytes Absolute: 0.6 10*3/uL (ref 0.1–1.0)
Monocytes Absolute: 0.5 10*3/uL (ref 0.1–1.0)
Monocytes Relative: 6 %
Monocytes Relative: 4 %
Neutro Abs: 9.8 10*3/uL — ABNORMAL HIGH (ref 1.7–7.7)
Neutro Abs: 9.8 10*3/uL — ABNORMAL HIGH (ref 1.7–7.7)
Neutrophils Relative %: 86 %
Neutrophils Relative %: 87 %
Platelets: 242 10*3/uL (ref 150–400)
Platelets: 242 10*3/uL (ref 150–400)
RBC: 3.12 MIL/uL — ABNORMAL LOW (ref 3.87–5.11)
RBC: 2.93 MIL/uL — ABNORMAL LOW (ref 3.87–5.11)
RDW: 19.1 % — ABNORMAL HIGH (ref 11.5–15.5)
RDW: 19.2 % — ABNORMAL HIGH (ref 11.5–15.5)
WBC: 11.3 10*3/uL — ABNORMAL HIGH (ref 4.0–10.5)
WBC: 11.1 10*3/uL — ABNORMAL HIGH (ref 4.0–10.5)
nRBC: 4.3 % — ABNORMAL HIGH (ref 0.0–0.2)
nRBC: 3 % — ABNORMAL HIGH (ref 0.0–0.2)

## 2024-01-02 LAB — COMPREHENSIVE METABOLIC PANEL
ALT: 31 U/L (ref 0–44)
AST: 32 U/L (ref 15–41)
Albumin: 2.6 g/dL — ABNORMAL LOW (ref 3.5–5.0)
Alkaline Phosphatase: 55 U/L (ref 38–126)
Anion gap: 10 (ref 5–15)
BUN: 18 mg/dL (ref 8–23)
CO2: 22 mmol/L (ref 22–32)
Calcium: 8.8 mg/dL — ABNORMAL LOW (ref 8.9–10.3)
Chloride: 120 mmol/L — ABNORMAL HIGH (ref 98–111)
Creatinine, Ser: 0.82 mg/dL (ref 0.44–1.00)
GFR, Estimated: >60 mL/min (ref >60)
Glucose, Bld: 109 mg/dL — ABNORMAL HIGH (ref 70–99)
Potassium: 3.6 mmol/L (ref 3.5–5.1)
Sodium: 152 mmol/L — ABNORMAL HIGH (ref 135–145)
Total Bilirubin: 0.8 mg/dL (ref 0.0–1.2)
Total Protein: 5.5 g/dL — ABNORMAL LOW (ref 6.5–8.1)

## 2024-01-02 LAB — CULTURE, BLOOD (ROUTINE X 2): Culture: NO GROWTH

## 2024-01-02 MED ORDER — ALBUMIN HUMAN 25 % IV SOLN
25.0000 g | Freq: Once | INTRAVENOUS | Status: AC
  Administered 2024-01-02: 25 g via INTRAVENOUS
  Filled 2024-01-02: qty 100

## 2024-01-02 MED ORDER — LACTATED RINGERS IV SOLN: INTRAVENOUS | Status: AC

## 2024-01-02 NOTE — Plan of Care (Signed)
 Pt alertx1, family at bedside, plan is home hospice

## 2024-01-02 NOTE — Progress Notes (Signed)
 Physical Therapy Treatment Patient Details Name: [name] MRN: [id number] DOB: 10-22-1941 Today's Date: 01/02/2024   History of Present Illness Pt is an 83 y.o. female who presents 2/1 with extensive right lower extremity DVT  and Pulmonary embolism. Recent admission and d/c s/p OR debridement 12/16/2023. PMH significant for dementia, heart murmur, HTN, osteoporosis, ORIF ankle fx, wrist surgery.    PT Comments  Reviewed bed mobility techniques Mod A +2, and transfer with assist to stand Max A+2 using Stedy today. Required more assist than last visit with standing (heavy posterior push.) Pt agreeable and eager to reposition due to buttock pain. After returning to bed, assist with 1/4 turn towards rt. Pillows placed around bony prominences of LEs. Call bell in place, reviewed use, bed alarm set. Will continue to follow and progress as tolerated until d/c. Noted plan for home with hospice to assist, and will provide needed DME.   If plan is discharge home, recommend the following: Two people to help with walking and/or transfers;Two people to help with bathing/dressing/bathroom;Assistance with cooking/housework;Assist for transportation;Supervision due to cognitive status;Help with stairs or ramp for entrance   Can travel by private vehicle     No  Equipment Recommendations  None recommended by PT    Recommendations for Other Services       Precautions / Restrictions Precautions Precautions: Fall Restrictions Weight Bearing Restrictions Per Provider Order: No     Mobility  Bed Mobility Overal bed mobility: Needs Assistance Bed Mobility: Sidelying to Sit, Sit to Sidelying, Rolling Rolling: Mod assist Sidelying to sit: Mod assist, +2 for physical assistance, HOB elevated, Used rails     Sit to sidelying: Max assist, +2 for physical assistance, +2 for safety/equipment General bed mobility comments: Mod assist to roll, guiding hand to reach for rail and assisted with LEs and trunk  off bed with +2 mod assist. Max assist +2 to return to supine, not following commands for steps to lower to Rt side.    Transfers Overall transfer level: Needs assistance Equipment used: Ambulation equipment used Transfers: Sit to/from Stand Sit to Stand: +2 physical assistance, Max assist, Via lift equipment, From elevated surface           General transfer comment: max assist +2 for boost to stand holding stedy with BIL UEs. Performed x2. BIL knees blocks, leans towards lt. Pt pushing back heavily on second attempt, not fully rising.    Ambulation/Gait               General Gait Details: Unable to progress to gait training at this time.   Stairs             Wheelchair Mobility     Tilt Bed    Modified Rankin (Stroke Patients Only)       Balance Overall balance assessment: Needs assistance Sitting-balance support: Feet supported, Single extremity supported Sitting balance-Leahy Scale: Poor Sitting balance - Comments: Min assist, progressed to CGA but holding bed for balance today.   Standing balance support: Bilateral upper extremity supported, Reliant on assistive device for balance Standing balance-Leahy Scale: Zero                              Cognition Arousal: Alert Behavior During Therapy: Anxious, Flat affect Overall Cognitive Status: No family/caregiver present to determine baseline cognitive functioning  General Comments: Anxious during  bed mobility, fear of falling.        Exercises General Exercises - Lower Extremity Ankle Circles/Pumps: Both, AAROM, 10 reps, Supine    General Comments        Pertinent Vitals/Pain Pain Assessment Pain Assessment: PAINAD Breathing: occasional labored breathing, short period of hyperventilation Negative Vocalization: occasional moan/groan, low speech, negative/disapproving quality Facial Expression: sad, frightened, frown Body Language:  tense, distressed pacing, fidgeting Consolability: distracted or reassured by voice/touch PAINAD Score: 5 Pain Location: sacral wound (presumed) Pain Descriptors / Indicators: Grimacing, Guarding Pain Intervention(s): Limited activity within patient's tolerance, Monitored during session, Repositioned    Home Living                          Prior Function            PT Goals (current goals can now be found in the care plan section) Acute Rehab PT Goals Patient Stated Goal: None stated PT Goal Formulation: Patient unable to participate in goal setting Time For Goal Achievement: 01/13/24 Potential to Achieve Goals: Fair Progress towards PT goals: Progressing toward goals    Frequency    Min 1X/week      PT Plan      Co-evaluation              AM-PAC PT 6 Clicks Mobility   Outcome Measure  Help needed turning from your back to your side while in a flat bed without using bedrails?: A Lot Help needed moving from lying on your back to sitting on the side of a flat bed without using bedrails?: Total Help needed moving to and from a bed to a chair (including a wheelchair)?: Total Help needed standing up from a chair using your arms (e.g., wheelchair or bedside chair)?: Total Help needed to walk in hospital room?: Total Help needed climbing 3-5 steps with a railing? : Total 6 Click Score: 7    End of Session Equipment Utilized During Treatment: Gait belt Activity Tolerance: Patient tolerated treatment well Patient left: with call bell/phone within reach;in bed;with bed alarm set (Rolled towards Rt side)   PT Visit Diagnosis: Unsteadiness on feet (R26.81);Muscle weakness (generalized) (M62.81);Difficulty in walking, not elsewhere classified (R26.2);Pain Pain - part of body:  (buttocks)     Time: 8853-8799 PT Time Calculation (min) (ACUTE ONLY): 14 min  Charges:    $Therapeutic Activity: 8-22 mins PT General Charges $$ ACUTE PT VISIT: 1 Visit                      Leontine Roads, PT, DPT [facility]  Rehabilitation Services Physical Therapist Office: [phone] Website: [email or web address]    Leontine GORMAN Roads 01/02/2024, 3:15 PM

## 2024-01-02 NOTE — Progress Notes (Signed)
 Progress Note   Patient: [name] FMW:[id number] DOB: Sep 01, 1941 DOA: 12/28/2023     5 DOS: the patient was seen and examined on 01/02/2024   Brief hospital course: 83 y.o. F with advanced dementia, lives at home, speech limited to short phrases/nonsensical and mostly nonambulatory at baseline, recent admission for infected sacral decubitus ulcer requiring surgical debridement who presented with leg swelling. Found to have extensive DVT to the iliacs and IVC as well as small PE.  Vascular surgery consulted and admitted on heparin .  Details below.   Assessment and Plan: Extensive right lower extremity DVT / Pulmonary embolism, acute Discussed with vascular surgery, despite the extensive nature, her swelling does not appear to be particular symptomatic at present, and with her limited ambulatory status, advanced dementia, risks of intervention outweigh possible benefits, and vascular surgery recommended against thrombectomy.  Patient was on heparin , transitioned to Eliquis  but then she developed melena suspicious of upper GI bleed.  Anticoagulation was held, GI consulted.  GI did not recommend any intervention due to advanced age and dementia and recommended trial of low-dose of anticoagulation.  Patient was started on Eliquis  10 mg p.o. twice daily and plan was to continue for 7 days, we will forego that and start her on only 5 mg p.o. twice daily.  If she re-develops bleeding then GI will be contacted again and unfortunately, the only option at that point will be to consider IVC filter placement. -Hgb stable at this time   Black tarry stool/presumed GI bleed: Developed black tarry stools after starting on Eliquis , Eliquis  held, GI consulted, GI did not offer any intervention due to advanced age and dementia.  They recommended starting on low-dose of anticoagulation.  Hemoglobin fortunately has remained stable thus far.Continue Protonix     Stage IV decubitus ulcer, present on admission /Possible  mild wound infection There was some concern for infection on presentation, but procalcitonin negative, there is no purulence in the pictures yesterday, nor is there any purulence today.  No surrounding erythema, induration, or drainage.CT showed no evidence of osteomyelitis, given her dementia, she is unfortunately unable to sit still enough for MRI.  Wound care consulted.  Wet-to-dry dressings.  Antibiotics were transitioned to Augmentin  to complete course   Hypernatremia: Worsening, up to 152 today -Continue basal LR -Will give dose of albumin  -recheck bmet in AM   Dementia - Continue Seroquel  -Resume home Vesicare  and mirtazapine    Hypertension Blood pressure normal, not on medication at home   Hypercholesterolemia -Continue Lipitor   Positive blood culture Coag negative staph in 1 of 2 blood cultures, suspect this is contaminant. - Follow second blood culture, no growth thus far  Hypoalbuminemia     Subjective: No complaints this AM  Physical Exam: Vitals:   01/02/24 0456 01/02/24 0905 01/02/24 0920 01/02/24 1357  BP: 122/88 (!) 141/55 (!) 129/59 125/61  Pulse: 78 75 77 76  Resp: 18 17  16   Temp: 97.9 F (36.6 C)   98.4 F (36.9 C)  TempSrc:      SpO2: 97%  97% 91%  Weight:      Height:       General exam: Conversant, in no acute distress Respiratory system: normal chest rise, clear, no audible wheezing Cardiovascular system: regular rhythm, s1-s2 Gastrointestinal system: Nondistended, nontender, pos BS Central nervous system: No seizures, no tremors Extremities: No cyanosis, no joint deformities Skin: No rashes, no pallor Psychiatry: Affect normal // no auditory hallucinations   Data Reviewed:  Labs reviewed: Na 152,  K 3.6, Cr 0.82, WBC 11.3, Hgb 8.8  Family Communication: Pt in room, family not at bedside  Disposition: Status is: Inpatient Remains inpatient appropriate because: severity of illness  Planned Discharge Destination: Skilled nursing  facility    Author: Garnette Pelt, MD 01/02/2024 4:02 PM  For on call review [email or web address].

## 2024-01-02 NOTE — TOC Progression Note (Signed)
 Transition of Care [facility]) - Progression Note    Patient Details  Name: [name] MRN: [id number] Date of Birth: [birth year]  Transition of Care [facility]) CM/SW Contact  Rosaline JONELLE Joe, RN Phone Number: 01/02/2024, 2:58 PM  Clinical Narrative:    Cm spoke with Dr. Cindy this morning and patient's Na level is increasing and needs IVF today so will be unable to discharge to home with [facility] hosp[facility] until tomorrow.        Expected Discharge Plan and Services                                               Social Determinants of Health (SDOH) Interventions SDOH Screenings   Food Insecurity: No Food Insecurity (12/29/2023)  Housing: Low Risk  (12/29/2023)  Transportation Needs: No Transportation Needs (12/29/2023)  Utilities: Not At Risk (12/29/2023)  Social Connections: Socially Isolated (12/29/2023)  Tobacco Use: Low Risk  (12/30/2023)    Readmission Risk Interventions     No data to display

## 2024-01-02 NOTE — Plan of Care (Signed)

## 2024-01-03 ENCOUNTER — Other Ambulatory Visit (HOSPITAL_COMMUNITY): Payer: Self-pay

## 2024-01-03 DIAGNOSIS — F03918 Unspecified dementia, unspecified severity, with other behavioral disturbance: Secondary | ICD-10-CM | POA: Diagnosis not present

## 2024-01-03 DIAGNOSIS — I82411 Acute embolism and thrombosis of right femoral vein: Secondary | ICD-10-CM | POA: Diagnosis not present

## 2024-01-03 DIAGNOSIS — I82220 Acute embolism and thrombosis of inferior vena cava: Secondary | ICD-10-CM | POA: Diagnosis not present

## 2024-01-03 LAB — CBC WITH DIFFERENTIAL/PLATELET
Abs Immature Granulocytes: 0.12 10*3/uL — ABNORMAL HIGH (ref 0.00–0.07)
Basophils Absolute: 0 10*3/uL (ref 0.0–0.1)
Basophils Relative: 0 %
Eosinophils Absolute: 0.2 10*3/uL (ref 0.0–0.5)
Eosinophils Relative: 2 %
HCT: 27.1 % — ABNORMAL LOW (ref 36.0–46.0)
Hemoglobin: 8.3 g/dL — ABNORMAL LOW (ref 12.0–15.0)
Immature Granulocytes: 1 %
Lymphocytes Relative: 7 %
Lymphs Abs: 0.7 10*3/uL (ref 0.7–4.0)
MCH: 28.3 pg (ref 26.0–34.0)
MCHC: 30.6 g/dL (ref 30.0–36.0)
MCV: 92.5 fL (ref 80.0–100.0)
Monocytes Absolute: 0.6 10*3/uL (ref 0.1–1.0)
Monocytes Relative: 6 %
Neutro Abs: 8.2 10*3/uL — ABNORMAL HIGH (ref 1.7–7.7)
Neutrophils Relative %: 84 %
Platelets: 228 10*3/uL (ref 150–400)
RBC: 2.93 MIL/uL — ABNORMAL LOW (ref 3.87–5.11)
RDW: 19.6 % — ABNORMAL HIGH (ref 11.5–15.5)
WBC: 9.9 10*3/uL (ref 4.0–10.5)
nRBC: 2.2 % — ABNORMAL HIGH (ref 0.0–0.2)

## 2024-01-03 LAB — COMPREHENSIVE METABOLIC PANEL
ALT: 25 U/L (ref 0–44)
AST: 25 U/L (ref 15–41)
Albumin: 2.6 g/dL — ABNORMAL LOW (ref 3.5–5.0)
Alkaline Phosphatase: 46 U/L (ref 38–126)
Anion gap: 9 (ref 5–15)
BUN: 11 mg/dL (ref 8–23)
CO2: 23 mmol/L (ref 22–32)
Calcium: 8.7 mg/dL — ABNORMAL LOW (ref 8.9–10.3)
Chloride: 116 mmol/L — ABNORMAL HIGH (ref 98–111)
Creatinine, Ser: 0.6 mg/dL (ref 0.44–1.00)
GFR, Estimated: >60 mL/min (ref >60)
Glucose, Bld: 96 mg/dL (ref 70–99)
Potassium: 3.7 mmol/L (ref 3.5–5.1)
Sodium: 148 mmol/L — ABNORMAL HIGH (ref 135–145)
Total Bilirubin: 0.8 mg/dL (ref 0.0–1.2)
Total Protein: 5.2 g/dL — ABNORMAL LOW (ref 6.5–8.1)

## 2024-01-03 LAB — PATHOLOGIST SMEAR REVIEW

## 2024-01-03 MED ORDER — HYDROCODONE-ACETAMINOPHEN 5-325 MG PO TABS
1.0000 | ORAL_TABLET | ORAL | 0 refills | Status: AC | PRN
  Filled 2024-01-03: qty 20, 2d supply, fill #0

## 2024-01-03 MED ORDER — APIXABAN 5 MG PO TABS
5.0000 mg | ORAL_TABLET | Freq: Two times a day (BID) | ORAL | 0 refills | Status: AC
  Filled 2024-01-03: qty 60, 30d supply, fill #0

## 2024-01-03 MED ORDER — ONDANSETRON HCL 4 MG PO TABS
4.0000 mg | ORAL_TABLET | Freq: Four times a day (QID) | ORAL | 0 refills | Status: AC | PRN
  Filled 2024-01-03: qty 20, 5d supply, fill #0

## 2024-01-03 NOTE — TOC Progression Note (Signed)
 Transition of Care [facility]) - Progression Note    Patient Details  Name: [name] MRN: [id number] Date of Birth: 01-04-1941  Transition of Care [facility]) CM/SW Contact  Waddell Barnie Rama, RN Phone Number: 01/03/2024, 4:24 PM  Clinical Narrative:    Doctor is discharging patient today, NCM informed son of dc, he states he would like patient to go home today, CSW [name] will schedule ptar.         Expected Discharge Plan and Services         Expected Discharge Date: 01/03/24                                     Social Determinants of Health (SDOH) Interventions SDOH Screenings   Food Insecurity: No Food Insecurity (12/29/2023)  Housing: Low Risk  (12/29/2023)  Transportation Needs: No Transportation Needs (12/29/2023)  Utilities: Not At Risk (12/29/2023)  Social Connections: Socially Isolated (12/29/2023)  Tobacco Use: Low Risk  (12/30/2023)    Readmission Risk Interventions     No data to display

## 2024-01-03 NOTE — TOC Progression Note (Signed)
 Transition of Care [facility]) - Progression Note    Patient Details  Name: [name] MRN: [id number] Date of Birth: [birth year]  Transition of Care [facility]) CM/SW Contact  Waddell Barnie Rama, RN Phone Number: 01/03/2024, 1:18 PM  Clinical Narrative:    Patient is set up with [facility] for home hospice, the DME was delivered yesterday.  Once she is ready she will need ptar transport.         Expected Discharge Plan and Services                                               Social Determinants of Health (SDOH) Interventions SDOH Screenings   Food Insecurity: No Food Insecurity (12/29/2023)  Housing: Low Risk  (12/29/2023)  Transportation Needs: No Transportation Needs (12/29/2023)  Utilities: Not At Risk (12/29/2023)  Social Connections: Socially Isolated (12/29/2023)  Tobacco Use: Low Risk  (12/30/2023)    Readmission Risk Interventions     No data to display

## 2024-01-03 NOTE — TOC Transition Note (Addendum)
 Transition of Care [facility]) - Discharge Note   Patient Details  Name: [name] MRN: [id number] Date of Birth: [birth year]  Transition of Care [facility]) CM/SW Contact:  Waddell Barnie Rama, RN Phone Number: 01/03/2024, 4:34 PM   Clinical Narrative:    For dc home with home hospice with [facility], the Nurse will come out tomorrow at 10 am to see patient.  [name] is setting up transportation.  NCM informed the son that it will probably take a couple hours.           Patient Goals and CMS Choice            Discharge Placement                       Discharge Plan and Services Additional resources added to the After Visit Summary for                                       Social Drivers of Health (SDOH) Interventions SDOH Screenings   Food Insecurity: No Food Insecurity (12/29/2023)  Housing: Low Risk  (12/29/2023)  Transportation Needs: No Transportation Needs (12/29/2023)  Utilities: Not At Risk (12/29/2023)  Social Connections: Socially Isolated (12/29/2023)  Tobacco Use: Low Risk  (12/30/2023)     Readmission Risk Interventions     No data to display

## 2024-01-03 NOTE — Progress Notes (Signed)
 Occupational Therapy Treatment Patient Details Name: BLEN RANSOME MRN: [id number] DOB: Jul 02, 1941 Today's Date: 01/03/2024   History of present illness Pt is an 83 y.o. female who presents 2/1 with extensive right lower extremity DVT  and Pulmonary embolism. Recent admission and d/c s/p OR debridement 12/16/2023. PMH significant for dementia, heart murmur, HTN, osteoporosis, ORIF ankle fx, wrist surgery.   OT comments  Son present for session. OT discussing education with regard to bed level positioning, turning, and floating heels to prevent pressure injuries. Patient more alert and speaking tangentially. Son politely declining movement or ADLs as patient typically does better with familiar faces and can get combative. All questions answered at the end of the session. OT will follow at a distance until patient is discharged to home with hospice.       If plan is discharge home, recommend the following:  Two people to help with walking and/or transfers;Two people to help with bathing/dressing/bathroom;Assist for transportation;Help with stairs or ramp for entrance;Supervision due to cognitive status;Assistance with cooking/housework;Assistance with feeding;Direct supervision/assist for medications management;Direct supervision/assist for financial management   Equipment Recommendations  Other (comment) (defer to next venue)    Recommendations for Other Services      Precautions / Restrictions Precautions Precautions: Fall Restrictions Weight Bearing Restrictions Per Provider Order: No       Mobility Bed Mobility Overal bed mobility: Needs Assistance             General bed mobility comments: Son declining OOB for patient at this time    Transfers Overall transfer level: Needs assistance                 General transfer comment: Son declining OOB for patient at this time     Balance Overall balance assessment: Needs assistance Sitting-balance support: Feet  supported, Single extremity supported Sitting balance-Leahy Scale: Poor Sitting balance - Comments: Min assist, progressed to CGA but holding bed for balance today.   Standing balance support: Bilateral upper extremity supported, Reliant on assistive device for balance Standing balance-Leahy Scale: Zero                             ADL either performed or assessed with clinical judgement   ADL Overall ADL's : Needs assistance/impaired                                     Functional mobility during ADLs: Maximal assistance;+2 for physical assistance;+2 for safety/equipment;Cueing for safety;Cueing for sequencing General ADL Comments: Son present for session. OT discussing education with regard to bed level positioning, turning, and floating heels to prevent pressure injuries. Patient more alert and speaking tangentially. Son politely declining movement or ADLs as patient typically does better with familiar faces and can get combative. All questions answered at the end of the session. OT will follow at a distance until patient is discharged to home with hospice.    Extremity/Trunk Assessment              Vision       Perception     Praxis      Cognition Arousal: Alert Behavior During Therapy: WFL for tasks assessed/performed Overall Cognitive Status: History of cognitive impairments - at baseline  General Comments: Son present, patient much more communicative        Exercises      Shoulder Instructions       General Comments      Pertinent Vitals/ Pain       Pain Assessment Pain Assessment: Faces Faces Pain Scale: No hurt  Home Living                                          Prior Functioning/Environment              Frequency  Min 1X/week        Progress Toward Goals  OT Goals(current goals can now be found in the care plan section)  Progress towards OT  goals: Progressing toward goals  Acute Rehab OT Goals Patient Stated Goal: per son: to be able to take his mother home OT Goal Formulation: With family Time For Goal Achievement: 01/13/24 Potential to Achieve Goals: Fair  Plan      Co-evaluation                 AM-PAC OT 6 Clicks Daily Activity     Outcome Measure   Help from another person eating meals?: Total Help from another person taking care of personal grooming?: Total Help from another person toileting, which includes using toliet, bedpan, or urinal?: Total Help from another person bathing (including washing, rinsing, drying)?: Total Help from another person to put on and taking off regular upper body clothing?: Total Help from another person to put on and taking off regular lower body clothing?: Total 6 Click Score: 6    End of Session    OT Visit Diagnosis: Unsteadiness on feet (R26.81);Muscle weakness (generalized) (M62.81)   Activity Tolerance Patient limited by lethargy   Patient Left in bed;with call bell/phone within reach;with bed alarm set;with family/visitor present   Nurse Communication Mobility status        Time: 8664-8651 OT Time Calculation (min): 13 min  Charges: OT General Charges $OT Visit: 1 Visit OT Treatments $Self Care/Home Management : 8-22 mins  Ronal Gift E. Cordai Rodrigue, OTR/L Acute Rehabilitation Services [phone]   Ronal Gift Salt 01/03/2024, 3:20 PM

## 2024-01-03 NOTE — Plan of Care (Signed)

## 2024-01-03 NOTE — Progress Notes (Signed)
 1251: This RN was notified patient's family wanted to speak with the provider about discharge questions/concerns. RN notified provider.  1504: This RN was notified by family they were still waiting to speak with provider and were concerned about her discharged being pushed back, provider notified again.   1540: Provider messaged this RN stating they were occupied at the moment, discussed with family who stated they were upset. At this time this RN discussed with facilities manager. Sent message to provider about possibly re-grouping tomorrow on discharge plans.  1600: Provider went to bedside and spoke with family. Patient's family is agreeable with discharge tonight. Discharge orders placed.

## 2024-01-03 NOTE — Discharge Summary (Signed)
 Physician Discharge Summary   Patient: [name] MRN: [id number] DOB: 04-27-1941  Admit date:     12/28/2023  Discharge date: 01/03/24  Discharge Physician: [name]   PCP: [name]   Recommendations at discharge:    Follow up with PCP in 1-2 weeks Follow up with Hospice services  Discharge Diagnoses: Principal Problem:   Pulmonary embolism without acute cor pulmonale (HCC) Active Problems:   Decubitus ulcer of buttock   HYPERCHOLESTEROLEMIA   HYPERTENSION, BENIGN SYSTEMIC   DVT (deep vein thrombosis) in pregnancy  Resolved Problems:   * No resolved hospital problems. [facility] Course: 83 y.o. F with advanced dementia, lives at home, speech limited to short phrases/nonsensical and mostly nonambulatory at baseline, recent admission for infected sacral decubitus ulcer requiring surgical debridement who presented with leg swelling. Found to have extensive DVT to the iliacs and IVC as well as small PE.  Vascular surgery consulted and admitted on heparin .  Details below.   Assessment and Plan: Extensive right lower extremity DVT / Pulmonary embolism, acute Discussed with vascular surgery, despite the extensive nature, her swelling does not appear to be particular symptomatic at present, and with her limited ambulatory status, advanced dementia, risks of intervention outweigh possible benefits, and vascular surgery recommended against thrombectomy.  Patient was on heparin , transitioned to Eliquis  but then she developed melena suspicious of upper GI bleed.  Anticoagulation was held, GI consulted.  GI did not recommend any intervention due to 83 advanced age and dementia and recommended trial of low-dose of anticoagulation.  Patient was started on Eliquis  10 mg p.o. twice daily and plan was to continue for 7 days, we will forego that and started her on only 5 mg p.o. twice daily.  Hgb has since remained stable   Black tarry stool/presumed GI bleed: Developed black tarry  stools after starting on Eliquis , Eliquis  held, GI consulted, GI did not offer any intervention due to 83 advanced age and dementia.  They recommended starting on low-dose of anticoagulation.  Hemoglobin fortunately has remained stable thus far.Continue Protonix     Stage IV decubitus ulcer, present on admission /Possible mild wound infection There was some concern for infection on presentation, but procalcitonin negative, there is no purulence in the pictures yesterday, nor is there any purulence today.  No surrounding erythema, induration, or drainage.CT showed no evidence of osteomyelitis, given her dementia, she is unfortunately unable to sit still enough for MRI.  Wound care consulted.  Wet-to-dry dressings.  Antibiotics were transitioned to Augmentin  and completed antibiotics   Hypernatremia: Peaked to 152  -Na improved with basal IVF   Dementia - Continue Seroquel  -Resume home Vesicare  and mirtazapine    Hypertension Blood pressure normal, not on medication at home   Hypercholesterolemia -Continue Lipitor   Positive blood culture Coag negative staph in 1 of 2 blood cultures, suspect this is contaminant. - Follow second blood culture, no growth thus far   Hypoalbuminemia        Consultants: GI Procedures performed:   Disposition: Home Diet recommendation:  Regular diet DISCHARGE MEDICATION: Allergies as of 01/03/2024       Reactions   Aspirin  Nausea And Vomiting   High doses        Medication List     TAKE these medications    acetaminophen  500 MG tablet Commonly known as: TYLENOL  Take 1 tablet (500 mg total) by mouth every 6 (six) hours as needed for fever.   apixaban  5 MG Tabs tablet Commonly known as: ELIQUIS   Take 1 tablet (5 mg total) by mouth 2 (two) times daily.   atorvastatin  40 MG tablet Commonly known as: LIPITOR Take 40 mg by mouth daily.   calcium -vitamin D 250-125 MG-UNIT tablet Commonly known as: OSCAL Take 1 tablet by mouth daily.    HYDROcodone -acetaminophen  5-325 MG tablet Commonly known as: NORCO/VICODIN Take 1-2 tablets by mouth every 4 (four) hours as needed for moderate pain (pain score 4-6).   memantine  10 MG tablet Commonly known as: NAMENDA  Take 1 tablet (10 mg total) by mouth 2 (two) times daily.   mirtazapine  7.5 MG tablet Commonly known as: REMERON  Take 1 tablet (7.5 mg total) by mouth at bedtime as needed for sleep   multivitamin tablet Take 1 tablet by mouth daily.   ondansetron  4 MG tablet Commonly known as: ZOFRAN  Take 1 tablet (4 mg total) by mouth every 6 (six) hours as needed for nausea.   QUEtiapine  25 MG tablet Commonly known as: SEROQUEL  Take 1 tablet (25 mg total) by mouth at bedtime as needed for sleep/confusion. What changed: when to take this   solifenacin  5 MG tablet Commonly known as: VESICARE  Take 1 tablet (5 mg total) by mouth every evening.   triamcinolone  cream 0.1 % Commonly known as: KENALOG  Apply 1 Application topically daily as needed (for itching).        Follow-up Information     [name] Follow up in 2 week(s).   Specialty: Family Medicine Why: Hospital follow up Contact information: [address] [facility] [place] [address] [phone]                Discharge Exam: [name]   12/28/23 1821 12/30/23 0900  Weight: 66.7 kg 65.3 kg   General exam: Awake, laying in bed, in nad Respiratory system: Normal respiratory effort, no wheezing Cardiovascular system: regular rate, s1, s2 Gastrointestinal system: Soft, nondistended, positive BS Central nervous system: CN2-12 grossly intact, strength intact Extremities: Perfused, no clubbing Skin: Normal skin turgor, no notable skin lesions seen Psychiatry: Mood normal // no visual hallucinations   Condition at discharge: fair  The results of significant diagnostics from this hospitalization (including imaging, microbiology, ancillary and laboratory) are listed below for  reference.   Imaging Studies: VAS US  LOWER EXTREMITY VENOUS (DVT) (7a-7p) Result Date: 12/29/2023  Lower Venous DVT Study Patient Name:  [name] [facility]  Date of Exam:   12/28/2023 Medical Rec #: [id number]       Accession #:    [id number] Date of Birth: 1941-05-18       Patient Gender: F Patient Age:   54 years Exam Location:  [facility] Procedure:      VAS US  LOWER EXTREMITY VENOUS (DVT) Referring Phys: LYNWOOD BUGLER --------------------------------------------------------------------------------  Indications: Swelling, Edema, and Post-op.  Limitations: Combative patient, limited exam. unable to image the contralateral limb. Comparison Study: No prior exam. Performing Technologist: Edilia Elden Appl  Examination Guidelines: A complete evaluation includes B-mode imaging, spectral Doppler, color Doppler, and power Doppler as needed of all accessible portions of each vessel. Bilateral testing is considered an integral part of a complete examination. Limited examinations for reoccurring indications may be performed as noted. The reflux portion of the exam is performed with the patient in reverse Trendelenburg.  +---------+---------------+---------+-----------+----------+-------------------+ RIGHT    CompressibilityPhasicitySpontaneityPropertiesThrombus Aging      +---------+---------------+---------+-----------+----------+-------------------+ CFV      None           No       No                                       +---------+---------------+---------+-----------+----------+-------------------+  SFJ      None                                                             +---------+---------------+---------+-----------+----------+-------------------+ FV Prox  None           No       No                                       +---------+---------------+---------+-----------+----------+-------------------+ FV Mid   None           No       No                                        +---------+---------------+---------+-----------+----------+-------------------+ FV DistalNone           No       No                                       +---------+---------------+---------+-----------+----------+-------------------+ PFV      None           No       No                                       +---------+---------------+---------+-----------+----------+-------------------+ POP      None           No       No                                       +---------+---------------+---------+-----------+----------+-------------------+ PTV                                                   Not well visualized +---------+---------------+---------+-----------+----------+-------------------+ PERO     Partial        No       No                                       +---------+---------------+---------+-----------+----------+-------------------+ GSV      None           Yes      Yes                                      +---------+---------------+---------+-----------+----------+-------------------+   +----+---------------+---------+-----------+----------+--------------+ LEFTCompressibilityPhasicitySpontaneityPropertiesThrombus Aging +----+---------------+---------+-----------+----------+--------------+ CFV Full                                                        +----+---------------+---------+-----------+----------+--------------+  Summary: RIGHT: - Findings consistent with age indeterminate deep vein thrombosis involving the right common femoral vein, SF junction, right femoral vein, right proximal profunda vein, right popliteal vein, and right peroneal veins.    *See table(s) above for measurements and observations. Electronically signed by Penne Colorado Foley on 12/29/2023 at 3:57:56 PM.    Final    CT Angio Chest PE W and/or Wo Contrast Result Date: 12/28/2023 CLINICAL DATA:  Two weeks postoperative from right sacral ulcer repair, right lower extremity  swelling and edema. Some purulence from the region of the decubitus ulcer. Dementia. EXAM: CT ANGIOGRAPHY CHEST CT ABDOMEN AND PELVIS WITH CONTRAST TECHNIQUE: Multidetector CT imaging of the chest was performed using the standard protocol during bolus administration of intravenous contrast. Multiplanar CT image reconstructions and MIPs were obtained to evaluate the vascular anatomy. Multidetector CT imaging of the abdomen and pelvis was performed using the standard protocol during bolus administration of intravenous contrast. RADIATION DOSE REDUCTION: This exam was performed according to the departmental dose-optimization program which includes automated exposure control, adjustment of the mA and/or kV according to patient size and/or use of iterative reconstruction technique. CONTRAST:  75mL OMNIPAQUE  IOHEXOL  350 MG/ML SOLN COMPARISON:  Multiple exams, including 09/13/2022 and CT pelvis from 11/27/2023 FINDINGS: CTA CHEST FINDINGS Cardiovascular: Small pulmonary embolus in the anterior segmental branch of the right upper lobe pulmonary artery, image 131 series 7. Segmental pulmonary embolus in the lingular segmental branch of the left pulmonary artery. No lobar or greater level clot, accordingly RV to left LV ratio measurement is not indicated. Coronary, aortic arch, and branch vessel atherosclerotic vascular disease. Mediastinum/Nodes: Chronic dense calcification adjacent to the distal esophagus and just above the diaphragm, not changed from 2023, probably a densely calcified lymph node. Dilated upper esophagus noted. Lungs/Pleura: Unremarkable Musculoskeletal: No acute musculoskeletal findings. Review of the MIP images confirms the above findings. CT ABDOMEN and PELVIS FINDINGS Hepatobiliary: Stable hypodense hepatic lesions, likely cysts. Gallbladder mildly obscured by motion artifact but grossly unremarkable. Pancreas: Stable mild prominence of dorsal pancreatic duct in the pancreatic head. Otherwise  unremarkable. Spleen: Benign calcified granulomas in the spleen. Adrenals/Urinary Tract: No significant findings. Stomach/Bowel: Mild prominence of stool in the proximal colon. No dilated bowel observed. Normal appendix. Vascular/Lymphatic: Atherosclerosis is present, including aortoiliac atherosclerotic disease. There is extensive thrombus in the right proximal superficial femoral vein, profundal branch, right common femoral vein, right external iliac vein, right common iliac vein, and extending up the IVC to the level of the renal veins. Reproductive: Calcified uterine fibroids. Other: Presacral edema. Musculoskeletal: Abnormal cutaneous thickening, subcutaneous stranding, and trace amount of left paracentral gas along the gluteal cleft with density in this vicinity extending to the outer periosteal margin of the sacrum. No clear bony destructive findings to further indicate osteomyelitis, although there is some mild presacral edema and MRI would be more specific with regard to osteomyelitis. The miniscule amount of gas to the left of the sulcus on image 65 series 3 may be spontaneously draining as a cause for the reported purulent discharge. No definite non draining abscess observed. Substantial subcutaneous edema in the right upper thigh, most likely related to the DVT. Review of the MIP images confirms the above findings. IMPRESSION: 1. 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 Given the unusual heavy clot burden, catheter thrombectomy should be considered  especially if the right lower extremity is symptomatic. 2. Small acute pulmonary emboli in the anterior segmental branch of the right upper lobe pulmonary artery and in the lingular segmental branch of the left pulmonary artery. No lobar or greater level clot,  accordingly RV to left LV ratio measurement is not indicated. 3. Abnormal cutaneous thickening, subcutaneous stranding, and trace amount of left paracentral gas along the gluteal cleft with density in this vicinity extending to the outer periosteal margin of the sacrum. No clear bony destructive findings to further indicate osteomyelitis, although there is some mild presacral edema and MRI would be more specific with regard to osteomyelitis. The miniscule amount of gas to the left of the sulcus may be spontaneously draining as a cause for the reported purulent discharge. No definite non draining abscess observed. 4. Substantial subcutaneous edema in the right upper thigh, most likely related to the DVT. 5. Mild prominence of stool in the proximal colon. 6. Calcified uterine fibroids. 7. Aortic atherosclerosis. Electronically Signed   By: Ryan Salvage M.D.   On: 12/28/2023 18:11   CT ABDOMEN PELVIS W CONTRAST Result Date: 12/28/2023 CLINICAL DATA:  Two weeks postoperative from right sacral ulcer repair, right lower extremity swelling and edema. Some purulence from the region of the decubitus ulcer. Dementia. EXAM: CT ANGIOGRAPHY CHEST CT ABDOMEN AND PELVIS WITH CONTRAST TECHNIQUE: Multidetector CT imaging of the chest was performed using the standard protocol during bolus administration of intravenous contrast. Multiplanar CT image reconstructions and MIPs were obtained to evaluate the vascular anatomy. Multidetector CT imaging of the abdomen and pelvis was performed using the standard protocol during bolus administration of intravenous contrast. RADIATION DOSE REDUCTION: This exam was performed according to the departmental dose-optimization program which includes automated exposure control, adjustment of the mA and/or kV according to patient size and/or use of iterative reconstruction technique. CONTRAST:  75mL OMNIPAQUE  IOHEXOL  350 MG/ML SOLN COMPARISON:  Multiple exams, including 09/13/2022 and CT pelvis  from 11/27/2023 FINDINGS: CTA CHEST FINDINGS Cardiovascular: Small pulmonary embolus in the anterior segmental branch of the right upper lobe pulmonary artery, image 131 series 7. Segmental pulmonary embolus in the lingular segmental branch of the left pulmonary artery. No lobar or greater level clot, accordingly RV to left LV ratio measurement is not indicated. Coronary, aortic arch, and branch vessel atherosclerotic vascular disease. Mediastinum/Nodes: Chronic dense calcification adjacent to the distal esophagus and just above the diaphragm, not changed from 2023, probably a densely calcified lymph node. Dilated upper esophagus noted. Lungs/Pleura: Unremarkable Musculoskeletal: No acute musculoskeletal findings. Review of the MIP images confirms the above findings. CT ABDOMEN and PELVIS FINDINGS Hepatobiliary: Stable hypodense hepatic lesions, likely cysts. Gallbladder mildly obscured by motion artifact but grossly unremarkable. Pancreas: Stable mild prominence of dorsal pancreatic duct in the pancreatic head. Otherwise unremarkable. Spleen: Benign calcified granulomas in the spleen. Adrenals/Urinary Tract: No significant findings. Stomach/Bowel: Mild prominence of stool in the proximal colon. No dilated bowel observed. Normal appendix. Vascular/Lymphatic: Atherosclerosis is present, including aortoiliac atherosclerotic disease. There is extensive thrombus in the right proximal superficial femoral vein, profundal branch, right common femoral vein, right external iliac vein, right common iliac vein, and extending up the IVC to the level of the renal veins. Reproductive: Calcified uterine fibroids. Other: Presacral edema. Musculoskeletal: Abnormal cutaneous thickening, subcutaneous stranding, and trace amount of left paracentral gas along the gluteal cleft with density in this vicinity extending to the outer periosteal margin of the sacrum. No clear bony destructive findings to further indicate osteomyelitis,  although there  is some mild presacral edema and MRI would be more specific with regard to osteomyelitis. The miniscule amount of gas to the left of the sulcus on image 65 series 3 may be spontaneously draining as a cause for the reported purulent discharge. No definite non draining abscess observed. Substantial subcutaneous edema in the right upper thigh, most likely related to the DVT. Review of the MIP images confirms the above findings. IMPRESSION: 1. 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 Given the unusual heavy clot burden, catheter thrombectomy should be considered especially if the right lower extremity is symptomatic. 2. Small acute pulmonary emboli in the anterior segmental branch of the right upper lobe pulmonary artery and in the lingular segmental branch of the left pulmonary artery. No lobar or greater level clot, accordingly RV to left LV ratio measurement is not indicated. 3. Abnormal cutaneous thickening, subcutaneous stranding, and trace amount of left paracentral gas along the gluteal cleft with density in this vicinity extending to the outer periosteal margin of the sacrum. No clear bony destructive findings to further indicate osteomyelitis, although there is some mild presacral edema and MRI would be more specific with regard to osteomyelitis. The miniscule amount of gas to the left of the sulcus may be spontaneously draining as a cause for the reported purulent discharge. No definite non draining abscess observed. 4. Substantial subcutaneous edema in the right upper thigh, most likely related to the DVT. 5. Mild prominence of stool in the proximal colon. 6. Calcified uterine fibroids. 7. Aortic atherosclerosis. Electronically Signed   By: Ryan Salvage M.D.   On: 12/28/2023 18:11   DG Pelvis  Portable Result Date: 12/28/2023 CLINICAL DATA:  Right lower extremity swelling. EXAM: PORTABLE PELVIS 1-2 VIEWS COMPARISON:  February 09, 2009. FINDINGS: There is no evidence of pelvic fracture or diastasis. No pelvic bone lesions are seen. IMPRESSION: Negative. Electronically Signed   By: Lynwood Landy Raddle M.D.   On: 12/28/2023 14:55    Microbiology: Results for orders placed or performed during the hospital encounter of 12/28/23  Culture, blood (routine x 2)     Status: None   Collection Time: 12/28/23  2:33 PM   Specimen: BLOOD LEFT FOREARM  Result Value Ref Range Status   Specimen Description BLOOD LEFT FOREARM  Final   Special Requests   Final    BOTTLES DRAWN AEROBIC AND ANAEROBIC Blood Culture results may not be optimal due to an inadequate volume of blood received in culture bottles   Culture   Final    NO GROWTH 5 DAYS Performed at [facility], [address], [place], [place] [address]    Report Status 01/02/2024 FINAL  Final  Culture, blood (routine x 2)     Status: Abnormal   Collection Time: 12/28/23  3:52 PM   Specimen: BLOOD RIGHT ARM  Result Value Ref Range Status   Specimen Description BLOOD RIGHT ARM  Final   Special Requests   Final    BOTTLES DRAWN AEROBIC AND ANAEROBIC Blood Culture results may not be optimal due to an inadequate volume of blood received in culture bottles   Culture  Setup Time   Final    GRAM POSITIVE COCCI IN CLUSTERS AEROBIC BOTTLE ONLY CRITICAL RESULT CALLED TO, READ BACK BY AND VERIFIED WITH: PHARMD TONY RUDISILL [id number] AT 1536 BY  EC    Culture (A)  Final    STAPHYLOCOCCUS EPIDERMIDIS THE SIGNIFICANCE OF ISOLATING THIS ORGANISM FROM A SINGLE SET OF BLOOD CULTURES WHEN MULTIPLE SETS ARE DRAWN IS UNCERTAIN. PLEASE NOTIFY THE MICROBIOLOGY DEPARTMENT WITHIN ONE WEEK IF SPECIATION AND SENSITIVITIES ARE REQUIRED. Performed at [facility], [address], [place], [place] [address]    Report Status 12/30/2023 FINAL  Final  Blood  Culture ID Panel (Reflexed)     Status: Abnormal   Collection Time: 12/28/23  3:52 PM  Result Value Ref Range Status   Enterococcus faecalis NOT DETECTED NOT DETECTED Final   Enterococcus Faecium NOT DETECTED NOT DETECTED Final   Listeria monocytogenes NOT DETECTED NOT DETECTED Final   Staphylococcus species DETECTED (A) NOT DETECTED Final    Comment: CRITICAL RESULT CALLED TO, READ BACK BY AND VERIFIED WITH: PHARMD TONY RUDISILL [id number] AT 1536 BY EC    Staphylococcus aureus (BCID) NOT DETECTED NOT DETECTED Final   Staphylococcus epidermidis DETECTED (A) NOT DETECTED Final    Comment: Methicillin (oxacillin) resistant coagulase negative staphylococcus. Possible blood culture contaminant (unless isolated from more than one blood culture draw or clinical case suggests pathogenicity). No antibiotic treatment is indicated for blood  culture contaminants. CRITICAL RESULT CALLED TO, READ BACK BY AND VERIFIED WITH: PHARMD TONY RUDISILL [id number] AT 1536 BY EC    Staphylococcus lugdunensis NOT DETECTED NOT DETECTED Final   Streptococcus species NOT DETECTED NOT DETECTED Final   Streptococcus agalactiae NOT DETECTED NOT DETECTED Final   Streptococcus pneumoniae NOT DETECTED NOT DETECTED Final   Streptococcus pyogenes NOT DETECTED NOT DETECTED Final   A.calcoaceticus-baumannii NOT DETECTED NOT DETECTED Final   Bacteroides fragilis NOT DETECTED NOT DETECTED Final   Enterobacterales NOT DETECTED NOT DETECTED Final   Enterobacter cloacae complex NOT DETECTED NOT DETECTED Final   Escherichia coli NOT DETECTED NOT DETECTED Final   Klebsiella aerogenes NOT DETECTED NOT DETECTED Final   Klebsiella oxytoca NOT DETECTED NOT DETECTED Final   Klebsiella pneumoniae NOT DETECTED NOT DETECTED Final   Proteus species NOT DETECTED NOT DETECTED Final   Salmonella species NOT DETECTED NOT DETECTED Final   Serratia marcescens NOT DETECTED NOT DETECTED Final   Haemophilus influenzae NOT DETECTED NOT DETECTED  Final   Neisseria meningitidis NOT DETECTED NOT DETECTED Final   Pseudomonas aeruginosa NOT DETECTED NOT DETECTED Final   Stenotrophomonas maltophilia NOT DETECTED NOT DETECTED Final   Candida albicans NOT DETECTED NOT DETECTED Final   Candida auris NOT DETECTED NOT DETECTED Final   Candida glabrata NOT DETECTED NOT DETECTED Final   Candida krusei NOT DETECTED NOT DETECTED Final   Candida parapsilosis NOT DETECTED NOT DETECTED Final   Candida tropicalis NOT DETECTED NOT DETECTED Final   Cryptococcus neoformans/gattii NOT DETECTED NOT DETECTED Final   Methicillin resistance mecA/C DETECTED (A) NOT DETECTED Final    Comment: CRITICAL RESULT CALLED TO, READ BACK BY AND VERIFIED WITH: PHARMD TONY RUDISILL [id number] AT 1536 BY EC Performed at [facility] [facility], [address] [place], [place] [address]     Labs: CBC: Recent Labs  Lab 01/01/24 [phone] 01/01/24 1622 01/02/24 0712 01/02/24 1716 01/03/24 0705  WBC 15.4* 13.5* 11.3* 11.1* 9.9  NEUTROABS 12.3* 12.4* 9.8* 9.8* 8.2*  HGB 9.2* 8.6* 8.8* 8.2* 8.3*  HCT 30.0* 28.2* 28.9* 26.8* 27.1*  MCV 91.5 91.9 92.6 91.5 92.5  PLT 260 263 242 242 228   Basic Metabolic Panel: Recent Labs  Lab 12/28/23 1549 12/30/23 9367 12/31/23 0558 01/01/24 0913 01/02/24 9287  01/03/24 0705  NA 143 145 149* 150* 152* 148*  K 3.6 4.1 4.0 3.6 3.6 3.7  CL 105 113* 115* 116* 120* 116*  CO2 23 21* 23 23 22 23   GLUCOSE 107* 178* 164* 114* 109* 96  BUN 15 37* 36* 23 18 11   CREATININE 0.92 0.96 0.92 0.78 0.82 0.60  CALCIUM  9.2 8.7* 9.3 8.9 8.8* 8.7*  MG 2.5*  --   --   --   --   --   PHOS 3.5  --   --   --   --   --    Liver Function Tests: Recent Labs  Lab 12/30/23 0632 12/31/23 0558 01/01/24 0913 01/02/24 0712 01/03/24 0705  AST 29 26 33 32 25  ALT 39 36 35 31 25  ALKPHOS 54 55 55 55 46  BILITOT 0.6 0.4 0.5 0.8 0.8  PROT 5.7* 5.9* 5.8* 5.5* 5.2*  ALBUMIN  2.5* 2.7* 2.7* 2.6* 2.6*   CBG: No results for input(s): GLUCAP in the last 168  hours.  Discharge time spent: less than 30 minutes.  Signed: Garnette Pelt, [facility] 01/03/2024

## 2024-01-03 NOTE — Progress Notes (Signed)
 Brief Palliative Medicine Progress Note:  PMT following peripherally for needs/decline:  Medical records reviewed including progress notes, labs, imaging.   Plan for discharge home with hospice today.  PMT will continue to follow peripherally. If there are any imminent needs please call the service directly. Family also has PMT contact information should further needs arise.  Thank you for allowing PMT to assist in the care of this patient.  Amarilys Lyles M. [facility] Palliative Medicine Team Team Phone: [phone] NO CHARGE

## 2024-01-06 ENCOUNTER — Other Ambulatory Visit (HOSPITAL_COMMUNITY): Payer: Self-pay

## 2024-01-27 ENCOUNTER — Ambulatory Visit (HOSPITAL_BASED_OUTPATIENT_CLINIC_OR_DEPARTMENT_OTHER): Payer: Medicare Other | Admitting: General Surgery

## 2024-02-25 DEATH — deceased
